# Patient Record
Sex: Male | Born: 1935 | Race: Black or African American | Hispanic: No | Smoking: Former smoker
Health system: Southern US, Community
[De-identification: ages and names within clinical notes are randomized; demographics above are authoritative.]

## PROBLEM LIST (undated history)

## (undated) DIAGNOSIS — I872 Venous insufficiency (chronic) (peripheral): Secondary | ICD-10-CM

## (undated) DIAGNOSIS — C801 Malignant (primary) neoplasm, unspecified: Secondary | ICD-10-CM

## (undated) DIAGNOSIS — H547 Unspecified visual loss: Secondary | ICD-10-CM

## (undated) DIAGNOSIS — I10 Essential (primary) hypertension: Secondary | ICD-10-CM

## (undated) DIAGNOSIS — H548 Legal blindness, as defined in USA: Secondary | ICD-10-CM

## (undated) DIAGNOSIS — E785 Hyperlipidemia, unspecified: Secondary | ICD-10-CM

## (undated) DIAGNOSIS — Z8719 Personal history of other diseases of the digestive system: Principal | ICD-10-CM

## (undated) DIAGNOSIS — F329 Major depressive disorder, single episode, unspecified: Secondary | ICD-10-CM

## (undated) DIAGNOSIS — I5032 Chronic diastolic (congestive) heart failure: Secondary | ICD-10-CM

## (undated) DIAGNOSIS — M1990 Unspecified osteoarthritis, unspecified site: Secondary | ICD-10-CM

## (undated) DIAGNOSIS — K219 Gastro-esophageal reflux disease without esophagitis: Secondary | ICD-10-CM

## (undated) DIAGNOSIS — J449 Chronic obstructive pulmonary disease, unspecified: Secondary | ICD-10-CM

## (undated) DIAGNOSIS — F32A Depression, unspecified: Secondary | ICD-10-CM

## (undated) DIAGNOSIS — IMO0001 Reserved for inherently not codable concepts without codable children: Secondary | ICD-10-CM

## (undated) DIAGNOSIS — K5900 Constipation, unspecified: Secondary | ICD-10-CM

## (undated) DIAGNOSIS — M48061 Spinal stenosis, lumbar region without neurogenic claudication: Secondary | ICD-10-CM

## (undated) DIAGNOSIS — F528 Other sexual dysfunction not due to a substance or known physiological condition: Secondary | ICD-10-CM

## (undated) DIAGNOSIS — L0390 Cellulitis, unspecified: Secondary | ICD-10-CM

## (undated) HISTORY — DX: Chronic diastolic (congestive) heart failure: I50.32

## (undated) HISTORY — DX: Spinal stenosis, lumbar region without neurogenic claudication: M48.061

## (undated) HISTORY — PX: COLON SURGERY: SHX602

## (undated) HISTORY — DX: Other sexual dysfunction not due to a substance or known physiological condition: F52.8

## (undated) HISTORY — PX: EXPLORATORY LAPAROTOMY: SUR591

## (undated) HISTORY — PX: APPENDECTOMY: SHX54

## (undated) HISTORY — DX: Personal history of other diseases of the digestive system: Z87.19

## (undated) HISTORY — PX: EYE SURGERY: SHX253

## (undated) HISTORY — DX: Chronic obstructive pulmonary disease, unspecified: J44.9

## (undated) HISTORY — DX: Unspecified osteoarthritis, unspecified site: M19.90

## (undated) HISTORY — DX: Legal blindness, as defined in USA: H54.8

---

## 1999-07-06 ENCOUNTER — Encounter: Payer: Self-pay | Admitting: Family Medicine

## 1999-07-06 ENCOUNTER — Encounter: Admission: RE | Admit: 1999-07-06 | Discharge: 1999-07-06 | Payer: Self-pay | Admitting: Family Medicine

## 1999-07-06 ENCOUNTER — Ambulatory Visit (HOSPITAL_COMMUNITY): Admission: RE | Admit: 1999-07-06 | Discharge: 1999-07-06 | Payer: Self-pay | Admitting: Family Medicine

## 2001-06-21 ENCOUNTER — Encounter: Admission: RE | Admit: 2001-06-21 | Discharge: 2001-06-21 | Payer: Self-pay | Admitting: Family Medicine

## 2001-06-21 ENCOUNTER — Encounter: Payer: Self-pay | Admitting: Family Medicine

## 2001-10-28 ENCOUNTER — Encounter: Payer: Self-pay | Admitting: Family Medicine

## 2001-10-28 ENCOUNTER — Encounter: Admission: RE | Admit: 2001-10-28 | Discharge: 2001-10-28 | Payer: Self-pay | Admitting: Family Medicine

## 2002-09-16 ENCOUNTER — Encounter: Admission: RE | Admit: 2002-09-16 | Discharge: 2002-09-16 | Payer: Self-pay | Admitting: Family Medicine

## 2002-09-16 ENCOUNTER — Encounter: Payer: Self-pay | Admitting: Family Medicine

## 2003-06-01 ENCOUNTER — Emergency Department (HOSPITAL_COMMUNITY): Admission: AD | Admit: 2003-06-01 | Discharge: 2003-06-01 | Payer: Self-pay | Admitting: Family Medicine

## 2003-10-08 ENCOUNTER — Encounter: Admission: RE | Admit: 2003-10-08 | Discharge: 2003-10-08 | Payer: Self-pay | Admitting: Internal Medicine

## 2003-10-22 ENCOUNTER — Encounter: Admission: RE | Admit: 2003-10-22 | Discharge: 2003-10-22 | Payer: Self-pay | Admitting: Internal Medicine

## 2003-11-20 ENCOUNTER — Encounter: Admission: RE | Admit: 2003-11-20 | Discharge: 2003-11-20 | Payer: Self-pay | Admitting: Internal Medicine

## 2003-12-04 ENCOUNTER — Encounter: Admission: RE | Admit: 2003-12-04 | Discharge: 2003-12-04 | Payer: Self-pay | Admitting: Internal Medicine

## 2003-12-11 ENCOUNTER — Encounter: Admission: RE | Admit: 2003-12-11 | Discharge: 2003-12-11 | Payer: Self-pay | Admitting: Internal Medicine

## 2003-12-11 ENCOUNTER — Ambulatory Visit (HOSPITAL_COMMUNITY): Admission: RE | Admit: 2003-12-11 | Discharge: 2003-12-11 | Payer: Self-pay | Admitting: Internal Medicine

## 2003-12-16 ENCOUNTER — Ambulatory Visit (HOSPITAL_BASED_OUTPATIENT_CLINIC_OR_DEPARTMENT_OTHER): Admission: RE | Admit: 2003-12-16 | Discharge: 2003-12-16 | Payer: Self-pay | Admitting: Internal Medicine

## 2003-12-29 ENCOUNTER — Ambulatory Visit (HOSPITAL_COMMUNITY): Admission: RE | Admit: 2003-12-29 | Discharge: 2003-12-29 | Payer: Self-pay

## 2003-12-29 ENCOUNTER — Encounter (INDEPENDENT_AMBULATORY_CARE_PROVIDER_SITE_OTHER): Payer: Self-pay | Admitting: *Deleted

## 2003-12-30 ENCOUNTER — Encounter: Payer: Self-pay | Admitting: Cardiology

## 2003-12-30 ENCOUNTER — Ambulatory Visit (HOSPITAL_COMMUNITY): Admission: RE | Admit: 2003-12-30 | Discharge: 2003-12-30 | Payer: Self-pay | Admitting: Internal Medicine

## 2004-01-15 ENCOUNTER — Ambulatory Visit: Payer: Self-pay | Admitting: Internal Medicine

## 2004-03-28 ENCOUNTER — Ambulatory Visit: Payer: Self-pay | Admitting: Internal Medicine

## 2004-03-30 ENCOUNTER — Ambulatory Visit: Payer: Self-pay | Admitting: Internal Medicine

## 2004-04-14 ENCOUNTER — Ambulatory Visit: Payer: Self-pay | Admitting: Internal Medicine

## 2004-04-15 ENCOUNTER — Ambulatory Visit: Payer: Self-pay | Admitting: Internal Medicine

## 2004-05-06 ENCOUNTER — Ambulatory Visit (HOSPITAL_COMMUNITY): Admission: RE | Admit: 2004-05-06 | Discharge: 2004-05-06 | Payer: Self-pay | Admitting: Internal Medicine

## 2004-07-13 ENCOUNTER — Ambulatory Visit: Payer: Self-pay | Admitting: Internal Medicine

## 2004-09-26 ENCOUNTER — Ambulatory Visit (HOSPITAL_COMMUNITY): Admission: RE | Admit: 2004-09-26 | Discharge: 2004-09-26 | Payer: Self-pay | Admitting: Gastroenterology

## 2004-09-30 ENCOUNTER — Ambulatory Visit: Payer: Self-pay | Admitting: Internal Medicine

## 2004-10-07 ENCOUNTER — Ambulatory Visit (HOSPITAL_COMMUNITY): Admission: RE | Admit: 2004-10-07 | Discharge: 2004-10-07 | Payer: Self-pay | Admitting: Internal Medicine

## 2004-10-10 ENCOUNTER — Ambulatory Visit: Payer: Self-pay | Admitting: Internal Medicine

## 2004-10-31 ENCOUNTER — Ambulatory Visit: Payer: Self-pay | Admitting: Internal Medicine

## 2004-11-28 ENCOUNTER — Ambulatory Visit: Payer: Self-pay | Admitting: Internal Medicine

## 2004-12-27 ENCOUNTER — Ambulatory Visit: Payer: Self-pay | Admitting: Internal Medicine

## 2005-02-07 ENCOUNTER — Ambulatory Visit (HOSPITAL_COMMUNITY): Admission: RE | Admit: 2005-02-07 | Discharge: 2005-02-07 | Payer: Self-pay | Admitting: Internal Medicine

## 2005-02-07 ENCOUNTER — Ambulatory Visit: Payer: Self-pay | Admitting: Hospitalist

## 2005-06-20 ENCOUNTER — Ambulatory Visit: Payer: Self-pay | Admitting: Internal Medicine

## 2005-06-23 ENCOUNTER — Ambulatory Visit (HOSPITAL_COMMUNITY): Admission: RE | Admit: 2005-06-23 | Discharge: 2005-06-23 | Payer: Self-pay | Admitting: Internal Medicine

## 2005-09-05 ENCOUNTER — Ambulatory Visit: Payer: Self-pay | Admitting: Hospitalist

## 2005-10-18 ENCOUNTER — Ambulatory Visit (HOSPITAL_COMMUNITY): Admission: EM | Admit: 2005-10-18 | Discharge: 2005-10-19 | Payer: Self-pay | Admitting: Family Medicine

## 2005-11-17 ENCOUNTER — Ambulatory Visit: Payer: Self-pay | Admitting: Internal Medicine

## 2006-02-07 ENCOUNTER — Ambulatory Visit: Payer: Self-pay | Admitting: Internal Medicine

## 2006-04-11 ENCOUNTER — Ambulatory Visit: Payer: Self-pay | Admitting: Internal Medicine

## 2006-04-11 ENCOUNTER — Encounter (INDEPENDENT_AMBULATORY_CARE_PROVIDER_SITE_OTHER): Payer: Self-pay | Admitting: Internal Medicine

## 2006-04-11 LAB — CONVERTED CEMR LAB
BUN: 14 mg/dL (ref 6–23)
Chloride: 104 meq/L (ref 96–112)
Creatinine, Ser: 1.1 mg/dL (ref 0.40–1.50)
Glucose, Bld: 104 mg/dL — ABNORMAL HIGH (ref 70–99)
LDL Cholesterol: 71 mg/dL (ref 0–99)
Potassium: 4.1 meq/L (ref 3.5–5.3)
Triglycerides: 54 mg/dL (ref <150)
VLDL: 11 mg/dL (ref 0–40)

## 2006-05-25 ENCOUNTER — Telehealth: Payer: Self-pay | Admitting: *Deleted

## 2006-07-11 ENCOUNTER — Telehealth (INDEPENDENT_AMBULATORY_CARE_PROVIDER_SITE_OTHER): Payer: Self-pay | Admitting: *Deleted

## 2006-08-07 ENCOUNTER — Telehealth (INDEPENDENT_AMBULATORY_CARE_PROVIDER_SITE_OTHER): Payer: Self-pay | Admitting: *Deleted

## 2006-08-15 ENCOUNTER — Ambulatory Visit: Payer: Self-pay | Admitting: *Deleted

## 2006-08-15 DIAGNOSIS — I10 Essential (primary) hypertension: Secondary | ICD-10-CM | POA: Insufficient documentation

## 2006-08-15 DIAGNOSIS — E785 Hyperlipidemia, unspecified: Secondary | ICD-10-CM

## 2006-08-15 DIAGNOSIS — H548 Legal blindness, as defined in USA: Secondary | ICD-10-CM

## 2006-08-15 DIAGNOSIS — M159 Polyosteoarthritis, unspecified: Secondary | ICD-10-CM

## 2006-08-15 DIAGNOSIS — F528 Other sexual dysfunction not due to a substance or known physiological condition: Secondary | ICD-10-CM

## 2006-08-15 HISTORY — DX: Legal blindness, as defined in USA: H54.8

## 2006-08-15 HISTORY — DX: Other sexual dysfunction not due to a substance or known physiological condition: F52.8

## 2006-08-24 ENCOUNTER — Telehealth (INDEPENDENT_AMBULATORY_CARE_PROVIDER_SITE_OTHER): Payer: Self-pay | Admitting: *Deleted

## 2006-08-27 ENCOUNTER — Ambulatory Visit: Payer: Self-pay | Admitting: Internal Medicine

## 2006-08-27 ENCOUNTER — Encounter (INDEPENDENT_AMBULATORY_CARE_PROVIDER_SITE_OTHER): Payer: Self-pay | Admitting: *Deleted

## 2006-08-27 LAB — CONVERTED CEMR LAB
ALT: 11 units/L (ref 0–53)
AST: 12 units/L (ref 0–37)
Albumin: 4.1 g/dL (ref 3.5–5.2)
CO2: 29 meq/L (ref 19–32)
Calcium: 9.1 mg/dL (ref 8.4–10.5)
Chloride: 105 meq/L (ref 96–112)
Creatinine, Ser: 1.3 mg/dL (ref 0.40–1.50)
Potassium: 4.1 meq/L (ref 3.5–5.3)
Sodium: 142 meq/L (ref 135–145)
Total Protein: 6.9 g/dL (ref 6.0–8.3)

## 2006-09-04 ENCOUNTER — Encounter (INDEPENDENT_AMBULATORY_CARE_PROVIDER_SITE_OTHER): Payer: Self-pay | Admitting: Internal Medicine

## 2006-09-24 ENCOUNTER — Telehealth (INDEPENDENT_AMBULATORY_CARE_PROVIDER_SITE_OTHER): Payer: Self-pay | Admitting: *Deleted

## 2007-01-21 ENCOUNTER — Telehealth: Payer: Self-pay | Admitting: *Deleted

## 2007-01-25 ENCOUNTER — Telehealth (INDEPENDENT_AMBULATORY_CARE_PROVIDER_SITE_OTHER): Payer: Self-pay | Admitting: *Deleted

## 2007-02-07 ENCOUNTER — Ambulatory Visit (HOSPITAL_COMMUNITY): Admission: RE | Admit: 2007-02-07 | Discharge: 2007-02-07 | Payer: Self-pay | Admitting: Internal Medicine

## 2007-02-07 ENCOUNTER — Ambulatory Visit: Payer: Self-pay | Admitting: Internal Medicine

## 2007-02-07 ENCOUNTER — Encounter (INDEPENDENT_AMBULATORY_CARE_PROVIDER_SITE_OTHER): Payer: Self-pay | Admitting: Infectious Diseases

## 2007-02-07 DIAGNOSIS — M25559 Pain in unspecified hip: Secondary | ICD-10-CM

## 2007-02-07 LAB — CONVERTED CEMR LAB
ALT: 9 units/L (ref 0–53)
AST: 13 units/L (ref 0–37)
Alkaline Phosphatase: 65 units/L (ref 39–117)
CO2: 27 meq/L (ref 19–32)
Creatinine, Ser: 1.2 mg/dL (ref 0.40–1.50)
Sodium: 142 meq/L (ref 135–145)
Total Bilirubin: 0.4 mg/dL (ref 0.3–1.2)
Total Protein: 6.8 g/dL (ref 6.0–8.3)

## 2007-03-25 ENCOUNTER — Telehealth (INDEPENDENT_AMBULATORY_CARE_PROVIDER_SITE_OTHER): Payer: Self-pay | Admitting: *Deleted

## 2007-05-15 ENCOUNTER — Ambulatory Visit: Payer: Self-pay | Admitting: Internal Medicine

## 2007-05-15 DIAGNOSIS — M5430 Sciatica, unspecified side: Secondary | ICD-10-CM | POA: Insufficient documentation

## 2007-06-05 ENCOUNTER — Encounter: Admission: RE | Admit: 2007-06-05 | Discharge: 2007-06-19 | Payer: Self-pay | Admitting: *Deleted

## 2007-06-17 ENCOUNTER — Telehealth: Payer: Self-pay | Admitting: Internal Medicine

## 2007-06-17 ENCOUNTER — Encounter: Payer: Self-pay | Admitting: Internal Medicine

## 2007-08-27 ENCOUNTER — Encounter: Payer: Self-pay | Admitting: Internal Medicine

## 2007-08-29 ENCOUNTER — Telehealth: Payer: Self-pay | Admitting: Infectious Disease

## 2007-09-16 ENCOUNTER — Telehealth: Payer: Self-pay | Admitting: Internal Medicine

## 2007-09-18 ENCOUNTER — Encounter: Payer: Self-pay | Admitting: Internal Medicine

## 2007-09-18 ENCOUNTER — Ambulatory Visit: Payer: Self-pay | Admitting: *Deleted

## 2007-09-19 LAB — CONVERTED CEMR LAB
Albumin: 4.7 g/dL (ref 3.5–5.2)
BUN: 19 mg/dL (ref 6–23)
Calcium: 9.5 mg/dL (ref 8.4–10.5)
Chloride: 104 meq/L (ref 96–112)
Creatinine, Ser: 1.22 mg/dL (ref 0.40–1.50)
Eosinophils Absolute: 0.4 10*3/uL (ref 0.0–0.7)
Glucose, Bld: 104 mg/dL — ABNORMAL HIGH (ref 70–99)
Hemoglobin: 12 g/dL — ABNORMAL LOW (ref 13.0–17.0)
Leukocytes, UA: NEGATIVE
Lymphs Abs: 1.8 10*3/uL (ref 0.7–4.0)
MCHC: 31.8 g/dL (ref 30.0–36.0)
MCV: 86.1 fL (ref 78.0–100.0)
Microalb, Ur: 1.04 mg/dL (ref 0.00–1.89)
Monocytes Absolute: 0.7 10*3/uL (ref 0.1–1.0)
Monocytes Relative: 15 % — ABNORMAL HIGH (ref 3–12)
Neutrophils Relative %: 40 % — ABNORMAL LOW (ref 43–77)
Nitrite: NEGATIVE
Potassium: 3.8 meq/L (ref 3.5–5.3)
Protein, ur: NEGATIVE mg/dL
RBC: 4.38 M/uL (ref 4.22–5.81)
Specific Gravity, Urine: 1.024 (ref 1.005–1.03)
Urobilinogen, UA: 0.2 (ref 0.0–1.0)
WBC: 4.8 10*3/uL (ref 4.0–10.5)

## 2007-10-18 ENCOUNTER — Telehealth: Payer: Self-pay | Admitting: *Deleted

## 2007-10-22 ENCOUNTER — Encounter: Payer: Self-pay | Admitting: Internal Medicine

## 2007-12-03 ENCOUNTER — Telehealth: Payer: Self-pay | Admitting: Internal Medicine

## 2007-12-04 ENCOUNTER — Ambulatory Visit: Payer: Self-pay | Admitting: Internal Medicine

## 2007-12-10 ENCOUNTER — Encounter: Payer: Self-pay | Admitting: Internal Medicine

## 2007-12-10 ENCOUNTER — Ambulatory Visit: Payer: Self-pay | Admitting: *Deleted

## 2007-12-10 DIAGNOSIS — L259 Unspecified contact dermatitis, unspecified cause: Secondary | ICD-10-CM | POA: Insufficient documentation

## 2007-12-11 LAB — CONVERTED CEMR LAB
Eosinophils Absolute: 0.8 10*3/uL — ABNORMAL HIGH (ref 0.0–0.7)
Lymphocytes Relative: 22 % (ref 12–46)
Lymphs Abs: 1.6 10*3/uL (ref 0.7–4.0)
Neutro Abs: 4 10*3/uL (ref 1.7–7.7)
Neutrophils Relative %: 55 % (ref 43–77)
Platelets: 330 10*3/uL (ref 150–400)
WBC: 7.2 10*3/uL (ref 4.0–10.5)

## 2007-12-18 ENCOUNTER — Ambulatory Visit: Payer: Self-pay | Admitting: Infectious Diseases

## 2007-12-18 ENCOUNTER — Encounter (INDEPENDENT_AMBULATORY_CARE_PROVIDER_SITE_OTHER): Payer: Self-pay | Admitting: *Deleted

## 2007-12-19 LAB — CONVERTED CEMR LAB
Albumin: 3.6 g/dL (ref 3.5–5.2)
Alkaline Phosphatase: 66 units/L (ref 39–117)
CO2: 28 meq/L (ref 19–32)
Chloride: 102 meq/L (ref 96–112)
Glucose, Bld: 103 mg/dL — ABNORMAL HIGH (ref 70–99)
LDL Cholesterol: 100 mg/dL — ABNORMAL HIGH (ref 0–99)
Potassium: 4.3 meq/L (ref 3.5–5.3)
Sodium: 142 meq/L (ref 135–145)
Total Protein: 7.6 g/dL (ref 6.0–8.3)
Triglycerides: 236 mg/dL — ABNORMAL HIGH (ref <150)

## 2007-12-23 ENCOUNTER — Emergency Department (HOSPITAL_COMMUNITY): Admission: EM | Admit: 2007-12-23 | Discharge: 2007-12-23 | Payer: Self-pay | Admitting: Family Medicine

## 2007-12-25 ENCOUNTER — Emergency Department (HOSPITAL_COMMUNITY): Admission: EM | Admit: 2007-12-25 | Discharge: 2007-12-25 | Payer: Self-pay | Admitting: Family Medicine

## 2007-12-26 ENCOUNTER — Ambulatory Visit: Payer: Self-pay | Admitting: *Deleted

## 2007-12-26 DIAGNOSIS — L03119 Cellulitis of unspecified part of limb: Secondary | ICD-10-CM

## 2007-12-26 DIAGNOSIS — L02419 Cutaneous abscess of limb, unspecified: Secondary | ICD-10-CM

## 2008-01-01 ENCOUNTER — Ambulatory Visit: Payer: Self-pay | Admitting: *Deleted

## 2008-01-08 ENCOUNTER — Ambulatory Visit: Payer: Self-pay | Admitting: Vascular Surgery

## 2008-01-08 ENCOUNTER — Ambulatory Visit: Admission: RE | Admit: 2008-01-08 | Discharge: 2008-01-08 | Payer: Self-pay | Admitting: Family Medicine

## 2008-01-08 ENCOUNTER — Encounter (INDEPENDENT_AMBULATORY_CARE_PROVIDER_SITE_OTHER): Payer: Self-pay | Admitting: Orthopedic Surgery

## 2008-01-22 ENCOUNTER — Ambulatory Visit: Payer: Self-pay | Admitting: Internal Medicine

## 2008-01-29 ENCOUNTER — Encounter: Payer: Self-pay | Admitting: Internal Medicine

## 2008-02-03 ENCOUNTER — Encounter: Payer: Self-pay | Admitting: Internal Medicine

## 2008-02-20 ENCOUNTER — Ambulatory Visit: Payer: Self-pay | Admitting: Infectious Disease

## 2008-02-24 ENCOUNTER — Ambulatory Visit: Payer: Self-pay | Admitting: Family Medicine

## 2008-03-30 ENCOUNTER — Ambulatory Visit: Payer: Self-pay | Admitting: Family Medicine

## 2008-03-30 DIAGNOSIS — M48061 Spinal stenosis, lumbar region without neurogenic claudication: Secondary | ICD-10-CM

## 2008-03-30 HISTORY — DX: Spinal stenosis, lumbar region without neurogenic claudication: M48.061

## 2008-04-27 ENCOUNTER — Ambulatory Visit: Payer: Self-pay | Admitting: Sports Medicine

## 2008-05-05 ENCOUNTER — Encounter (INDEPENDENT_AMBULATORY_CARE_PROVIDER_SITE_OTHER): Payer: Self-pay | Admitting: *Deleted

## 2008-05-18 ENCOUNTER — Telehealth (INDEPENDENT_AMBULATORY_CARE_PROVIDER_SITE_OTHER): Payer: Self-pay | Admitting: *Deleted

## 2008-05-29 ENCOUNTER — Ambulatory Visit: Payer: Self-pay | Admitting: Sports Medicine

## 2008-06-01 ENCOUNTER — Telehealth (INDEPENDENT_AMBULATORY_CARE_PROVIDER_SITE_OTHER): Payer: Self-pay | Admitting: *Deleted

## 2008-06-04 ENCOUNTER — Encounter: Payer: Self-pay | Admitting: Internal Medicine

## 2008-06-10 ENCOUNTER — Encounter: Payer: Self-pay | Admitting: Internal Medicine

## 2008-06-10 ENCOUNTER — Ambulatory Visit: Payer: Self-pay | Admitting: *Deleted

## 2008-06-24 ENCOUNTER — Encounter: Payer: Self-pay | Admitting: Internal Medicine

## 2008-06-30 ENCOUNTER — Ambulatory Visit: Payer: Self-pay | Admitting: Internal Medicine

## 2008-06-30 ENCOUNTER — Encounter: Payer: Self-pay | Admitting: Internal Medicine

## 2008-07-01 LAB — CONVERTED CEMR LAB
CO2: 28 meq/L (ref 19–32)
Chloride: 104 meq/L (ref 96–112)
Creatinine, Ser: 1.63 mg/dL — ABNORMAL HIGH (ref 0.40–1.50)

## 2008-08-13 ENCOUNTER — Telehealth: Payer: Self-pay | Admitting: *Deleted

## 2008-10-21 ENCOUNTER — Ambulatory Visit: Payer: Self-pay | Admitting: Internal Medicine

## 2008-10-21 ENCOUNTER — Encounter: Payer: Self-pay | Admitting: Internal Medicine

## 2008-10-21 LAB — CONVERTED CEMR LAB
BUN: 19 mg/dL (ref 6–23)
CO2: 26 meq/L (ref 19–32)
Chloride: 103 meq/L (ref 96–112)
Creatinine, Ser: 1.51 mg/dL — ABNORMAL HIGH (ref 0.40–1.50)
GFR calc non Af Amer: 46 mL/min — ABNORMAL LOW (ref >60)

## 2008-12-10 ENCOUNTER — Ambulatory Visit: Payer: Self-pay | Admitting: Internal Medicine

## 2008-12-10 DIAGNOSIS — M79609 Pain in unspecified limb: Secondary | ICD-10-CM

## 2008-12-16 ENCOUNTER — Encounter: Payer: Self-pay | Admitting: Internal Medicine

## 2008-12-28 ENCOUNTER — Encounter: Payer: Self-pay | Admitting: Internal Medicine

## 2009-02-05 ENCOUNTER — Ambulatory Visit: Payer: Self-pay | Admitting: Internal Medicine

## 2009-04-28 ENCOUNTER — Telehealth: Payer: Self-pay | Admitting: Internal Medicine

## 2009-05-08 DIAGNOSIS — Z8719 Personal history of other diseases of the digestive system: Secondary | ICD-10-CM

## 2009-05-08 HISTORY — DX: Personal history of other diseases of the digestive system: Z87.19

## 2009-05-13 ENCOUNTER — Ambulatory Visit: Payer: Self-pay | Admitting: Internal Medicine

## 2009-06-10 ENCOUNTER — Telehealth: Payer: Self-pay | Admitting: Internal Medicine

## 2009-06-21 ENCOUNTER — Ambulatory Visit: Payer: Self-pay | Admitting: Internal Medicine

## 2009-06-21 LAB — CONVERTED CEMR LAB
ALT: 12 units/L (ref 0–53)
Albumin: 4.2 g/dL (ref 3.5–5.2)
BUN: 17 mg/dL (ref 6–23)
CO2: 31 meq/L (ref 19–32)
Calcium: 9.4 mg/dL (ref 8.4–10.5)
Chloride: 99 meq/L (ref 96–112)
Cholesterol: 190 mg/dL (ref 0–200)
Creatinine, Ser: 1.25 mg/dL (ref 0.40–1.50)
Eosinophils Absolute: 0.3 10*3/uL (ref 0.0–0.7)
Eosinophils Relative: 6 % — ABNORMAL HIGH (ref 0–5)
HCT: 40.5 % (ref 39.0–52.0)
HDL: 38 mg/dL — ABNORMAL LOW (ref >39)
Hemoglobin: 12.6 g/dL — ABNORMAL LOW (ref 13.0–17.0)
Lymphs Abs: 2.1 10*3/uL (ref 0.7–4.0)
MCV: 85.8 fL (ref >=78.0)
Monocytes Relative: 11 % (ref 3–12)
RBC: 4.72 M/uL (ref 4.22–5.81)
Total CHOL/HDL Ratio: 5
WBC: 5.3 10*3/uL (ref 4.0–10.5)

## 2009-06-25 ENCOUNTER — Encounter: Payer: Self-pay | Admitting: Internal Medicine

## 2009-07-23 ENCOUNTER — Encounter: Payer: Self-pay | Admitting: Internal Medicine

## 2009-08-13 ENCOUNTER — Encounter: Payer: Self-pay | Admitting: Internal Medicine

## 2009-08-19 ENCOUNTER — Ambulatory Visit: Payer: Self-pay | Admitting: Internal Medicine

## 2009-10-26 ENCOUNTER — Telehealth (INDEPENDENT_AMBULATORY_CARE_PROVIDER_SITE_OTHER): Payer: Self-pay | Admitting: *Deleted

## 2009-12-17 ENCOUNTER — Emergency Department (HOSPITAL_COMMUNITY): Admission: EM | Admit: 2009-12-17 | Discharge: 2009-12-17 | Payer: Self-pay | Admitting: Family Medicine

## 2009-12-17 ENCOUNTER — Telehealth: Payer: Self-pay | Admitting: Internal Medicine

## 2009-12-20 ENCOUNTER — Ambulatory Visit: Payer: Self-pay | Admitting: Internal Medicine

## 2009-12-20 DIAGNOSIS — M542 Cervicalgia: Secondary | ICD-10-CM | POA: Insufficient documentation

## 2010-02-07 ENCOUNTER — Ambulatory Visit: Payer: Self-pay | Admitting: Internal Medicine

## 2010-02-07 DIAGNOSIS — K5900 Constipation, unspecified: Secondary | ICD-10-CM | POA: Insufficient documentation

## 2010-02-07 DIAGNOSIS — K219 Gastro-esophageal reflux disease without esophagitis: Secondary | ICD-10-CM

## 2010-02-17 ENCOUNTER — Ambulatory Visit: Payer: Self-pay | Admitting: Internal Medicine

## 2010-02-17 LAB — CONVERTED CEMR LAB
CO2: 31 meq/L (ref 19–32)
Calcium: 9.1 mg/dL (ref 8.4–10.5)
Chloride: 100 meq/L (ref 96–112)
Glucose, Bld: 98 mg/dL (ref 70–99)
Sodium: 139 meq/L (ref 135–145)

## 2010-03-09 ENCOUNTER — Inpatient Hospital Stay (HOSPITAL_COMMUNITY): Admission: EM | Admit: 2010-03-09 | Discharge: 2010-03-17 | Payer: Self-pay | Admitting: Emergency Medicine

## 2010-03-09 ENCOUNTER — Ambulatory Visit: Payer: Self-pay | Admitting: Internal Medicine

## 2010-03-10 ENCOUNTER — Encounter: Payer: Self-pay | Admitting: Internal Medicine

## 2010-03-13 ENCOUNTER — Encounter: Payer: Self-pay | Admitting: Internal Medicine

## 2010-04-12 ENCOUNTER — Ambulatory Visit: Payer: Self-pay | Admitting: Internal Medicine

## 2010-05-18 ENCOUNTER — Ambulatory Visit: Admit: 2010-05-18 | Payer: Self-pay

## 2010-05-24 ENCOUNTER — Telehealth: Payer: Self-pay | Admitting: Internal Medicine

## 2010-05-28 ENCOUNTER — Encounter: Payer: Self-pay | Admitting: Internal Medicine

## 2010-06-03 ENCOUNTER — Ambulatory Visit: Admit: 2010-06-03 | Payer: Self-pay

## 2010-06-07 NOTE — Letter (Signed)
Summary: OUTPATIENT CLINIC MEDICATION CONTRACT  OUTPATIENT CLINIC MEDICATION CONTRACT   Imported By: Margie Billet 08/20/2009 13:44:18  _____________________________________________________________________  External Attachment:    Type:   Image     Comment:   External Document

## 2010-06-07 NOTE — Assessment & Plan Note (Signed)
Summary: urgent care-high blood pressure per dr mcphearson/[facility]([place])   Vital Signs:  Patient profile:   76 year old male Height:      71 inches Weight:      245.5 pounds BMI:     34.36 Temp:     99.1 degrees F oral Pulse rate:   91 / minute BP sitting:   189 / 94  (right arm)  Vitals Entered By: Filomena Jungling NT II (December 20, 2009 9:05 AM) CC: ER FOLLOWUP, Hypertension Management Is Patient Diabetic? No Pain Assessment Patient in pain? yes     Location: NECK,BACK,  Intensity: 8 Type: aching Nutritional Status BMI of > 30 = obese  Have you ever been in a relationship where you felt threatened, hurt or afraid?No   Does patient need assistance? Functional Status Self care Ambulation Normal   Primary Care Provider:  Julaine Fusi  DO  CC:  ER FOLLOWUP and Hypertension Management.  History of Present Illness: Follow up on: 1. HTN --takes all his meds as prescribed. Took today's dose 1 hour prior ot OV. Denies any HA, dizzines, CP, SOB or any other Sx. 2. Follow up from ED on 12/17/09 for a C-spine MSK strain. Felxeryl alleviates the Sx. Denies any weakness, tingling or numbness. Reports gradula resolution of left-sided neck pain which is currently at 3/10 in intensity and without  radiculopathy. 3. Requests refills on his meds ->requests a hard copy "b/c having trouble filling them when just called in."  Hypertension History:      Positive major cardiovascular risk factors include male age 57 years old or older, hyperlipidemia, and hypertension.  Negative major cardiovascular risk factors include non-tobacco-user status.        Further assessment for target organ damage reveals no history of ASHD or cardiac end-organ damage (CHF/LVH).     Preventive Screening-Counseling & Management  Alcohol-Tobacco     Smoking Status: quit     Year Quit: 20 YEARS AGO  Caffeine-Diet-Exercise     Does Patient Exercise: yes     Type of exercise: WALKING OUTSIDE     Times/week:  3  Problems Prior to Update: 1)  Neck Pain, Left  (ICD-723.1) 2)  Foot Pain, Bilateral  (ICD-729.5) 3)  Spinal Stenosis, Lumbar  (ICD-724.02) 4)  Cellulitis, Leg, Right  (ICD-682.6) 5)  Contact Dermatitis&other Eczema Due Unspec Cause  (ICD-692.9) 6)  Hypertension, Essential Nos  (ICD-401.9) 7)  Osteoarthrosis, Generalized, Multiple Sites  (ICD-715.09) 8)  Hip Pain, Right, Chronic  (ICD-719.45) 9)  Hyperlipidemia  (ICD-272.4) 10)  Sciatica  (ICD-724.3) 11)  Blindness  (ICD-369.4) 12)  Erectile Dysfunction  (ICD-302.72) 13)  Preventive Health Care  (ICD-V70.0)  Current Problems (verified): 1)  Foot Pain, Bilateral  (ICD-729.5) 2)  Spinal Stenosis, Lumbar  (ICD-724.02) 3)  Cellulitis, Leg, Right  (ICD-682.6) 4)  Contact Dermatitis&other Eczema Due Unspec Cause  (ICD-692.9) 5)  Hypertension, Essential Nos  (ICD-401.9) 6)  Osteoarthrosis, Generalized, Multiple Sites  (ICD-715.09) 7)  Hip Pain, Right, Chronic  (ICD-719.45) 8)  Hyperlipidemia  (ICD-272.4) 9)  Sciatica  (ICD-724.3) 10)  Blindness  (ICD-369.4) 11)  Erectile Dysfunction  (ICD-302.72) 12)  Preventive Health Care  (ICD-V70.0)  Medications Prior to Update: 1)  Hydrocodone-Acetaminophen 7.5-325 Mg Tabs (Hydrocodone-Acetaminophen) .... One Tablet Every 4 Hours As Needed For Pain 2)  Gabapentin 600 Mg Tabs (Gabapentin) .... Take 1 Tablet By Mouth Three Times A Day or As Directed 3)  Voltaren 1 % Gel (Diclofenac Sodium) .... Apply Three Times A Day As Directed  To Painful Joints 4)  Enalapril-Hydrochlorothiazide 10-25 Mg Tabs (Enalapril-Hydrochlorothiazide) .... Take 1 Tablet By Mouth Once A Day 5)  Aspirin 81 Mg Tbec (Aspirin) 6)  Coreg 12.5 Mg Tabs (Carvedilol) .... Take 1 Tablet By Mouth Two Times A Day 7)  Aspirin 81 Mg Tbec (Aspirin) 8)  Triamcinolone Acetonide 0.1 % Crea (Triamcinolone Acetonide) .... Apply To Affected Area Twice Daily 9)  Crestor 5 Mg Tabs (Rosuvastatin Calcium) .... Take 1 Tablet By Mouth Once A Day At  Bedtime 10)  Flector 1.3 % Ptch (Diclofenac Epolamine) .... Apply One Patch To Right Hip As Directed For Pain  Current Medications (verified): 1)  Hydrocodone-Acetaminophen 7.5-325 Mg Tabs (Hydrocodone-Acetaminophen) .... One Tablet Every 4 Hours As Needed For Pain 2)  Gabapentin 600 Mg Tabs (Gabapentin) .... Take 1 Tablet By Mouth Three Times A Day or As Directed 3)  Voltaren 1 % Gel (Diclofenac Sodium) .... Apply Three Times A Day As Directed To Painful Joints 4)  Enalapril-Hydrochlorothiazide 10-25 Mg Tabs (Enalapril-Hydrochlorothiazide) .... Take 1 Tablet By Mouth Once A Day 5)  Aspirin 81 Mg Tbec (Aspirin) 6)  Coreg 12.5 Mg Tabs (Carvedilol) .... Take 1 Tablet By Mouth Two Times A Day 7)  Aspirin 81 Mg Tbec (Aspirin) 8)  Triamcinolone Acetonide 0.1 % Crea (Triamcinolone Acetonide) .... Apply To Affected Area Twice Daily 9)  Crestor 5 Mg Tabs (Rosuvastatin Calcium) .... Take 1 Tablet By Mouth Once A Day At Bedtime 10)  Flector 1.3 % Ptch (Diclofenac Epolamine) .... Apply One Patch To Right Hip As Directed For Pain  Allergies (verified): No Known Drug Allergies  Directives (verified): 1)  Full Code   Past History:  Past Medical History: Last updated: 12/04/2007 Blindness Gout Hypertension Anemia with a negative colonoscopy and long history of nonsteroidal anti-inflammatory drug use. Severe obstructive sleep apnea/hypopnea syndrome, respiratory disturbance index 52 per hour with mild oxygen desaturation  Family History: Last updated: 12/20/2009 No Heart Disease Both maternal grandparents: DM  Social History: Last updated: 06/10/2008 Married. Lives in [place] with sig.o. Marvene Staff Patient is legally Blind  Risk Factors: Exercise: yes (12/20/2009)  Risk Factors: Smoking Status: quit (12/20/2009)  Family History: Reviewed history from 05/15/2007 and no changes required. No Heart Disease Both maternal grandparents: DM  Social History: Reviewed history  from 06/10/2008 and no changes required. Married. Lives in [place] with sig.o. Marvene Staff Patient is legally Blind  Review of Systems  The patient denies anorexia, fever, weight loss, weight gain, vision loss, decreased hearing, hoarseness, chest pain, syncope, dyspnea on exertion, peripheral edema, prolonged cough, headaches, hemoptysis, abdominal pain, melena, hematochezia, severe indigestion/heartburn, hematuria, incontinence, genital sores, muscle weakness, suspicious skin lesions, transient blindness, difficulty walking, depression, unusual weight change, abnormal bleeding, enlarged lymph nodes, angioedema, breast masses, and testicular masses.    Physical Exam  General:  Well-developed,well-nourished,in no acute distress; alert,appropriate and cooperative throughout examination Head:  Normocephalic and atraumatic without obvious abnormalities. No apparent alopecia or balding. Eyes:  No corneal or conjunctival inflammation noted. EOMI. Perrla. Funduscopic exam benign, without hemorrhages, exudates or papilledema. Vision grossly normal. Ears:  External ear exam shows no significant lesions or deformities.  Otoscopic examination reveals clear canals, tympanic membranes are intact bilaterally without bulging, retraction, inflammation or discharge. Hearing is grossly normal bilaterally. Nose:  External nasal examination shows no deformity or inflammation. Nasal mucosa are pink and moist without lesions or exudates.; mildly hypertrophied turbinates.   Impression & Recommendations:  Problem # 1:  HYPERTENSION, ESSENTIAL NOS (ICD-401.9) Suboptimal control. Will double the dose  of Vasoretic. His updated medication list for this problem includes:    Enalapril-hydrochlorothiazide 10-25 Mg Tabs (Enalapril-hydrochlorothiazide) .Marland Kitchen... Take two tablets by mouth in am    Coreg 12.5 Mg Tabs (Carvedilol) .Marland Kitchen... Take 1 tablet by mouth two times a day  BP today: 189/94 Prior BP: 157/82  (08/19/2009)  Prior 10 Yr Risk Heart Disease: 40 % (12/10/2008)  Labs Reviewed: K+: 3.7 (06/21/2009) Creat: : 1.25 (06/21/2009)   Chol: 190 (06/21/2009)   HDL: 38 (06/21/2009)   LDL: 129 (06/21/2009)   TG: 116 (06/21/2009)  Problem # 2:  HYPERLIPIDEMIA (ICD-272.4) Increase dose of Rosuvastatin. Low cholesterol and high fiber diet discussed with the patient. His updated medication list for this problem includes:    Crestor 10 Mg Tabs (Rosuvastatin calcium) .Marland Kitchen... Take 1 tab by mouth at bedtime  Labs Reviewed: SGOT: 19 (06/21/2009)   SGPT: 12 (06/21/2009)  Lipid Goals: Chol Goal: 200 (12/10/2008)   HDL Goal: 40 (12/10/2008)   LDL Goal: 100 (12/10/2008)   TG Goal: 150 (12/10/2008)  Prior 10 Yr Risk Heart Disease: 40 % (12/10/2008)   HDL:38 (06/21/2009), 43 (12/18/2007)  LDL:129 (06/21/2009), 100 (12/18/2007)  Chol:190 (06/21/2009), 190 (12/18/2007)  Trig:116 (06/21/2009), 236 (12/18/2007)  Problem # 3:  NECK PAIN, LEFT (ICD-723.1)  Likely Sternocleidomastoidal muscle strain. Continue with warm and moist compresses three times a day as needed. Refilled Flexeryl --cautioned of sedation and risk for addiction. Patient and his wife verbalized understanding. His updated medication list for this problem includes:    Hydrocodone-acetaminophen 7.5-325 Mg Tabs (Hydrocodone-acetaminophen) ..... One tablet every 4 hours as needed for pain    Aspirin 81 Mg Tbec (Aspirin)    Cyclobenzaprine Hcl 5 Mg Tabs (Cyclobenzaprine hcl) .Marland Kitchen..Marland Kitchen Two times a day as needed  Discussed exercises and use of moist heat or cold and medication.   Complete Medication List: 1)  Hydrocodone-acetaminophen 7.5-325 Mg Tabs (Hydrocodone-acetaminophen) .... One tablet every 4 hours as needed for pain 2)  Gabapentin 600 Mg Tabs (Gabapentin) .... Take 1 tablet by mouth three times a day or as directed 3)  Voltaren 1 % Gel (Diclofenac sodium) .... Apply three times a day as directed to painful joints 4)   Enalapril-hydrochlorothiazide 10-25 Mg Tabs (Enalapril-hydrochlorothiazide) .... Take two tablets by mouth in am 5)  Aspirin 81 Mg Tbec (Aspirin) 6)  Coreg 12.5 Mg Tabs (Carvedilol) .... Take 1 tablet by mouth two times a day 7)  Aspirin 81 Mg Tbec (Aspirin) 8)  Triamcinolone Acetonide 0.1 % Crea (Triamcinolone acetonide) .... Apply to affected area twice daily 9)  Crestor 10 Mg Tabs (Rosuvastatin calcium) .... Take 1 tab by mouth at bedtime 10)  Cyclobenzaprine Hcl 5 Mg Tabs (Cyclobenzaprine hcl) .... Two times a day as needed  Hypertension Assessment/Plan:      The patient's hypertensive risk group is category B: At least one risk factor (excluding diabetes) with no target organ damage.  His calculated 10 year risk of coronary heart disease is 40 %.  Today's blood pressure is 189/94.  His blood pressure goal is < 140/90.  Patient Instructions: 1)  Please, return on 01/02/10 as was scheduled for a blood pressure recheck. Please, return in 8 weeks fasting for cholesterol check. 2)  Call with any questions. Prescriptions: CYCLOBENZAPRINE HCL 5 MG TABS (CYCLOBENZAPRINE HCL) two times a day as needed  #60 x 0   Entered and Authorized by:   Deatra Robinson MD   Signed by:   Deatra Robinson MD on 12/20/2009   Method used:   Print  then Give to Patient   RxID:   [id number] TRIAMCINOLONE ACETONIDE 0.1 % CREA (TRIAMCINOLONE ACETONIDE) Apply to affected area twice daily  #60 mg x 11   Entered and Authorized by:   Deatra Robinson MD   Signed by:   Deatra Robinson MD on 12/20/2009   Method used:   Print then Give to Patient   RxID:   [id number] COREG 12.5 MG TABS (CARVEDILOL) Take 1 tablet by mouth two times a day  #60 Tablet x 11   Entered and Authorized by:   Deatra Robinson MD   Signed by:   Deatra Robinson MD on 12/20/2009   Method used:   Print then Give to Patient   RxID:   [id number] VOLTAREN 1 % GEL (DICLOFENAC SODIUM) Apply three times a day as directed to painful joints   #200 Gram x 10   Entered and Authorized by:   Deatra Robinson MD   Signed by:   Deatra Robinson MD on 12/20/2009   Method used:   Print then Give to Patient   RxID:   [id number] GABAPENTIN 600 MG TABS (GABAPENTIN) Take 1 tablet by mouth three times a day or as directed  #100 Tablet x 5   Entered and Authorized by:   Deatra Robinson MD   Signed by:   Deatra Robinson MD on 12/20/2009   Method used:   Print then Give to Patient   RxID:   [id number] HYDROCODONE-ACETAMINOPHEN 7.5-325 MG TABS (HYDROCODONE-ACETAMINOPHEN) One tablet every 4 hours as needed for pain  #120 x 5   Entered and Authorized by:   Deatra Robinson MD   Signed by:   Deatra Robinson MD on 12/20/2009   Method used:   Print then Give to Patient   RxID:   [id number] ENALAPRIL-HYDROCHLOROTHIAZIDE 10-25 MG TABS (ENALAPRIL-HYDROCHLOROTHIAZIDE) Take two tablets by mouth in am  #60 x 11   Entered and Authorized by:   Deatra Robinson MD   Signed by:   Deatra Robinson MD on 12/20/2009   Method used:   Print then Give to Patient   RxID:   [id number] CRESTOR 10 MG TABS (ROSUVASTATIN CALCIUM) Take 1 tab by mouth at bedtime  #30 x 11   Entered and Authorized by:   Deatra Robinson MD   Signed by:   Deatra Robinson MD on 12/20/2009   Method used:   Print then Give to Patient   RxID:   [id number]   Prevention & Chronic Care Immunizations   Influenza vaccine: Fluvax MCR  (02/05/2009)   Influenza vaccine deferral: Deferred  (08/19/2009)   Influenza vaccine due: 01/06/2010    Tetanus booster: 08/07/2006: 08/07/2006   Td booster deferral: Deferred  (08/19/2009)   Tetanus booster due: 08/06/2016    Pneumococcal vaccine: Pneumovax (Medicare)  (01/22/2008)   Pneumococcal vaccine deferral: Not indicated  (12/20/2009)   Pneumococcal vaccine due: 01/21/2013    H. zoster vaccine: Not documented   H. zoster vaccine deferral: Not available  (08/19/2009)  Colorectal Screening   Hemoccult: Not documented    Hemoccult action/deferral: Deferred  (08/19/2009)    Colonoscopy: Not documented   Colonoscopy action/deferral: Deferred  (12/20/2009)   Colonoscopy due: 12/09/2018  Other Screening   PSA: Not documented   PSA action/deferral: Discussed-PSA declined  (12/20/2009)   PSA due due: 12/21/2010   Smoking status: quit  (12/20/2009)  Lipids   Total Cholesterol: 190  (06/21/2009)   Lipid panel action/deferral: Lipid Panel ordered   LDL: 129  (06/21/2009)   LDL Direct: Not documented  HDL: 38  (06/21/2009)   Triglycerides: 116  (06/21/2009)   Lipid panel due: 06/22/2010    SGOT (AST): 19  (06/21/2009)   SGPT (ALT): 12  (06/21/2009)   Alkaline phosphatase: 67  (06/21/2009)   Total bilirubin: 0.4  (06/21/2009)   Liver panel due: 06/21/2010    Lipid flowsheet reviewed?: Yes   Progress toward LDL goal: Unchanged  Hypertension   Last Blood Pressure: 189 / 94  (12/20/2009)   Serum creatinine: 1.25  (06/21/2009)   Serum potassium 3.7  (06/21/2009)   Basic metabolic panel due: 12/21/2010    Hypertension flowsheet reviewed?: Yes   Progress toward BP goal: Deteriorated  Self-Management Support :    Patient will work on the following items until the next clinic visit to reach self-care goals:     Medications and monitoring: take my medicines every day, bring all of my medications to every visit  (12/20/2009)     Eating: drink diet soda or water instead of juice or soda, eat more vegetables, use fresh or frozen vegetables, eat foods that are low in salt, eat baked foods instead of fried foods, eat fruit for snacks and desserts  (12/20/2009)     Activity: take a 30 minute walk every day  (12/20/2009)    Hypertension self-management support: Education handout, Resources for patients handout  (12/20/2009)   Hypertension education handout printed    Lipid self-management support: Education handout, Resources for patients handout  (12/20/2009)     Lipid education handout printed       Resource handout printed.

## 2010-06-07 NOTE — Assessment & Plan Note (Signed)
Summary: EST-ROUTINE CHECKUP/CH   Vital Signs:  Patient profile:   76 year old male Height:      71 inches  Vitals Entered By: Angelina Ok RN (March 10, 2010 11:00 AM)  Primary Care Provider:  Julaine Fusi  DO   History of Present Illness: Patient comes in today for routine follow-up and BP check. No new complaints-continues to have chronic pain for advanced osteorthritis requiring chronic opaies. His functional status is unchanged. He is legally blind.  Current Medications (verified): 1)  Hydrocodone-Acetaminophen 7.5-325 Mg Tabs (Hydrocodone-Acetaminophen) .... One Tablet Every 4 Hours As Needed For Pain 2)  Gabapentin 600 Mg Tabs (Gabapentin) .... Take 1/2 Tablet By Mouth Three Times A Day or As Directed 3)  Voltaren 1 % Gel (Diclofenac Sodium) .... Apply Three Times A Day As Directed To Painful Joints 4)  Enalapril-Hydrochlorothiazide 10-25 Mg Tabs (Enalapril-Hydrochlorothiazide) .... Take Two Tablets By Mouth in Am 5)  Aspirin 81 Mg Tbec (Aspirin) 6)  Coreg 12.5 Mg Tabs (Carvedilol) .... Take 1 Tablet By Mouth Two Times A Day 7)  Aspirin 81 Mg Tbec (Aspirin) 8)  Triamcinolone Acetonide 0.1 % Crea (Triamcinolone Acetonide) .... Apply To Affected Area Twice Daily 9)  Crestor 10 Mg Tabs (Rosuvastatin Calcium) .... Take 1 Tab By Mouth At Bedtime 10)  Cyclobenzaprine Hcl 5 Mg Tabs (Cyclobenzaprine Hcl) .... Two Times A Day As Needed 11)  Naprosyn 500 Mg Tabs (Naproxen) .... Take 1 Tablet By Mouth Two Times A Day 12)  Norvasc 5 Mg Tabs (Amlodipine Besylate) .... Take 1 Tablet By Mouth Once A Day 13)  Protonix 20 Mg Tbec (Pantoprazole Sodium) .... Take 1 Tablet By Mouth Once A Day 14)  Colace 100 Mg Caps (Docusate Sodium) .... Take 1 Tablet By Mouth Two Times A Day  Allergies (verified): No Known Drug Allergies  Review of Systems      See HPI  Physical Exam  General:  NAD Neck:  ttp left cervical region, Spurling maneuver negative, muscle spasms noted Lungs:  Normal  respiratory effort, chest expands symmetrically. Lungs are clear to auscultation, no crackles or wheezes. Heart:  Normal rate and regular rhythm. S1 and S2 normal without gallop, murmur, click, rub or other extra sounds. Abdomen:  soft, non-tender, and normal bowel sounds.   Msk:  normal ROM.     Impression & Recommendations:  Problem # 1:  HYPERTENSION, ESSENTIAL NOS (ICD-401.9) Still running high but much improved since his last visit.  Will increase his Coreg today.   His updated medication list for this problem includes:    Enalapril-hydrochlorothiazide 10-25 Mg Tabs (Enalapril-hydrochlorothiazide) .Marland Kitchen... Take two tablets by mouth in am    Carvedilol 25 Mg Tabs (Carvedilol) .Marland Kitchen... Take 1 tablet by mouth two times a day    Norvasc 5 Mg Tabs (Amlodipine besylate) .Marland Kitchen... Take 1 tablet by mouth once a day  Prior BP: 187/91 (02/07/2010)  Prior 10 Yr Risk Heart Disease: 40 % (12/10/2008)  Labs Reviewed: K+: 4.3 (02/17/2010) Creat: : 1.00 (02/17/2010)   Chol: 190 (06/21/2009)   HDL: 38 (06/21/2009)   LDL: 129 (06/21/2009)   TG: 116 (06/21/2009)  Problem # 2:  SPINAL STENOSIS, LUMBAR (ICD-724.02) Significant pain requiring opiates chronically. Stable on current dose. No change today.  Complete Medication List: 1)  Hydrocodone-acetaminophen 7.5-325 Mg Tabs (Hydrocodone-acetaminophen) .... One tablet every 4 hours as needed for pain 2)  Gabapentin 600 Mg Tabs (Gabapentin) .... Take 1/2 tablet by mouth three times a day or as directed 3)  Voltaren  1 % Gel (Diclofenac sodium) .... Apply three times a day as directed to painful joints 4)  Enalapril-hydrochlorothiazide 10-25 Mg Tabs (Enalapril-hydrochlorothiazide) .... Take two tablets by mouth in am 5)  Aspirin 81 Mg Tbec (Aspirin) 6)  Carvedilol 25 Mg Tabs (Carvedilol) .... Take 1 tablet by mouth two times a day 7)  Aspirin 81 Mg Tbec (Aspirin) 8)  Triamcinolone Acetonide 0.1 % Crea (Triamcinolone acetonide) .... Apply to affected area twice  daily 9)  Crestor 10 Mg Tabs (Rosuvastatin calcium) .... Take 1 tab by mouth at bedtime 10)  Cyclobenzaprine Hcl 5 Mg Tabs (Cyclobenzaprine hcl) .... Two times a day as needed 11)  Naprosyn 500 Mg Tabs (Naproxen) .... Take 1 tablet by mouth two times a day 12)  Norvasc 5 Mg Tabs (Amlodipine besylate) .... Take 1 tablet by mouth once a day 13)  Protonix 20 Mg Tbec (Pantoprazole sodium) .... Take 1 tablet by mouth once a day 14)  Colace 100 Mg Caps (Docusate sodium) .... Take 1 tablet by mouth two times a day   Orders Added: 1)  Est. Patient Level IV [16109]

## 2010-06-07 NOTE — Assessment & Plan Note (Signed)
Summary: FU/SB.   Vital Signs:  Patient profile:   76 year old male Height:      71 inches Weight:      251.2 pounds BMI:     35.16 Temp:     97.9 degrees F oral Pulse rate:   63 / minute BP sitting:   132 / 70  (right arm)  Vitals Entered By: Filomena Jungling NT II (April 12, 2010 2:18 PM) CC: HFU Is Patient Diabetic? No Pain Assessment Patient in pain? no      Nutritional Status BMI of > 30 = obese  Have you ever been in a relationship where you felt threatened, hurt or afraid?No   Does patient need assistance? Functional Status Self care Ambulation Normal   Primary Care Provider:  Julaine Fusi  DO  CC:  HFU.  History of Present Illness: 76 y/o man with pmh outlined below here on hospital followup. He is s/p ex-lap for an sbo in 03/11/10 by Dr. Magnus Ivan. Today, states that he does not have any complains, except for some persistent abdominal pain, which he states has been ongoing since surgery and describes it as a dull persistent ache. He has been following with Dr. Eliberto Ivory office since his surgery and today, states he hasn't had any intermittent fevers, sob, cp, n/v/d or other systemic symptoms. States that he has also been taking all his meds as scheduled.  Current Medications (verified): 1)  Hydrocodone-Acetaminophen 7.5-325 Mg Tabs (Hydrocodone-Acetaminophen) .... One Tablet Every 4 Hours As Needed For Pain 2)  Gabapentin 600 Mg Tabs (Gabapentin) .... Take 1/2 Tablet By Mouth Three Times A Day or As Directed 3)  Voltaren 1 % Gel (Diclofenac Sodium) .... Apply Three Times A Day As Directed To Painful Joints 4)  Enalapril-Hydrochlorothiazide 10-25 Mg Tabs (Enalapril-Hydrochlorothiazide) .... Take Two Tablets By Mouth in Am 5)  Aspirin 81 Mg Tbec (Aspirin) 6)  Carvedilol 12.5 Mg Tabs (Carvedilol) .... Take One Tablet By Mouth Two Times A Day 7)  Aspirin 81 Mg Tbec (Aspirin) 8)  Triamcinolone Acetonide 0.1 % Crea (Triamcinolone Acetonide) .... Apply To Affected Area  Twice Daily 9)  Crestor 10 Mg Tabs (Rosuvastatin Calcium) .... Take 1 Tab By Mouth At Bedtime 10)  Cyclobenzaprine Hcl 5 Mg Tabs (Cyclobenzaprine Hcl) .... Two Times A Day As Needed 11)  Naprosyn 500 Mg Tabs (Naproxen) .... Take 1 Tablet By Mouth Two Times A Day 12)  Norvasc 5 Mg Tabs (Amlodipine Besylate) .... Take 1 Tablet By Mouth Once A Day 13)  Protonix 20 Mg Tbec (Pantoprazole Sodium) .... Take 1 Tablet By Mouth Once A Day 14)  Colace 100 Mg Caps (Docusate Sodium) .... Take 1 Tablet By Mouth Two Times A Day  Allergies (verified): No Known Drug Allergies  Past History:  Past Medical History: Last updated: 12/04/2007 Blindness Gout Hypertension Anemia with a negative colonoscopy and long history of nonsteroidal anti-inflammatory drug use. Severe obstructive sleep apnea/hypopnea syndrome, respiratory disturbance index 52 per hour with mild oxygen desaturation  Family History: Last updated: 12/20/2009 No Heart Disease Both maternal grandparents: DM  Social History: Last updated: 06/10/2008 Married. Lives in [place] with sig.o. Marvene Staff Patient is legally Blind  Risk Factors: Exercise: yes (02/07/2010)  Risk Factors: Smoking Status: quit (02/07/2010)  Review of Systems      See HPI  Physical Exam  General:  alert.   Head:  normocephalic and atraumatic.   Eyes:  Blind Ears:  no external deformities.   Nose:  no external erythema and  no nasal discharge.   Neck:  supple.   Lungs:  normal respiratory effort, normal breath sounds, no crackles, and no wheezes.   Heart:  normal rate, regular rhythm, and no murmur.   Abdomen:  obese, normal bowel sounds. healed midline scar, no drainage or erythema, mild tenderness diffuseley Pulses:  normal peripheral pulses Extremities:  no edema or cyanosis Neurologic:  alert & oriented X3.   Skin:  color normal.   Psych:  normally interactive.     Impression & Recommendations:  Problem # 1:  HYPERTENSION, ESSENTIAL  NOS (ICD-401.9) Very well controlled. NO changes to his meds. He is doing very well overall, no post surgical complications, instructed to contact Dr. Eliberto Ivory office if he continues to have persistent or worsening abdominal pain or develops associated fevers.   His updated medication list for this problem includes:    Enalapril-hydrochlorothiazide 10-25 Mg Tabs (Enalapril-hydrochlorothiazide) .Marland Kitchen... Take two tablets by mouth in am    Carvedilol 12.5 Mg Tabs (Carvedilol) .Marland Kitchen... Take one tablet by mouth two times a day    Norvasc 5 Mg Tabs (Amlodipine besylate) .Marland Kitchen... Take 1 tablet by mouth once a day  BP today: 132/70 Prior BP: 187/91 (02/07/2010)  Prior 10 Yr Risk Heart Disease: 40 % (12/10/2008)  Labs Reviewed: K+: 4.3 (02/17/2010) Creat: : 1.00 (02/17/2010)   Chol: 190 (06/21/2009)   HDL: 38 (06/21/2009)   LDL: 129 (06/21/2009)   TG: 116 (06/21/2009)  Problem # 2:  PREVENTIVE HEALTH CARE (ICD-V70.0) Received flu shot in october.  Complete Medication List: 1)  Hydrocodone-acetaminophen 7.5-325 Mg Tabs (Hydrocodone-acetaminophen) .... One tablet every 4 hours as needed for pain 2)  Gabapentin 600 Mg Tabs (Gabapentin) .... Take 1/2 tablet by mouth three times a day or as directed 3)  Voltaren 1 % Gel (Diclofenac sodium) .... Apply three times a day as directed to painful joints 4)  Enalapril-hydrochlorothiazide 10-25 Mg Tabs (Enalapril-hydrochlorothiazide) .... Take two tablets by mouth in am 5)  Aspirin 81 Mg Tbec (Aspirin) 6)  Carvedilol 12.5 Mg Tabs (Carvedilol) .... Take one tablet by mouth two times a day 7)  Aspirin 81 Mg Tbec (Aspirin) 8)  Triamcinolone Acetonide 0.1 % Crea (Triamcinolone acetonide) .... Apply to affected area twice daily 9)  Crestor 10 Mg Tabs (Rosuvastatin calcium) .... Take 1 tab by mouth at bedtime 10)  Cyclobenzaprine Hcl 5 Mg Tabs (Cyclobenzaprine hcl) .... Two times a day as needed 11)  Naprosyn 500 Mg Tabs (Naproxen) .... Take 1 tablet by mouth two times  a day 12)  Norvasc 5 Mg Tabs (Amlodipine besylate) .... Take 1 tablet by mouth once a day 13)  Protonix 20 Mg Tbec (Pantoprazole sodium) .... Take 1 tablet by mouth once a day 14)  Colace 100 Mg Caps (Docusate sodium) .... Take 1 tablet by mouth two times a day  Patient Instructions: 1)  Please take ONLY the medications listed on your medication list. 2)  Pls make sure to call Dr. Eliberto Ivory office (your surgeon) if you are having worsening abdominal pain along with fevers. 3)  Pls don't hesitate to call us if you have any questions or concerns. 4)  Followup with DR.Phillips Odor in one month. 5)  Please schedule a follow-up appointment in 1 month.  Prescriptions: NORVASC 5 MG TABS (AMLODIPINE BESYLATE) Take 1 tablet by mouth once a day  #30 x 1   Entered and Authorized by:   Jaci Lazier MD   Signed by:   Jaci Lazier MD on 04/12/2010   Method used:  Print then Give to Patient   RxID:   [id number] NORVASC 5 MG TABS (AMLODIPINE BESYLATE) Take 1 tablet by mouth once a day  #30 x 1   Entered and Authorized by:   Jaci Lazier MD   Signed by:   Jaci Lazier MD on 04/12/2010   Method used:   Print then Give to Patient   RxID:   [id number]    Orders Added: 1)  Est. Patient Level III [10272]    Prevention & Chronic Care Immunizations   Influenza vaccine: Fluvax MCR  (02/07/2010)   Influenza vaccine deferral: Deferred  (08/19/2009)   Influenza vaccine due: 01/06/2010    Tetanus booster: 08/07/2006: 08/07/2006   Td booster deferral: Deferred  (08/19/2009)   Tetanus booster due: 08/06/2016    Pneumococcal vaccine: Pneumovax (Medicare)  (01/22/2008)   Pneumococcal vaccine deferral: Not indicated  (12/20/2009)   Pneumococcal vaccine due: 01/21/2013    H. zoster vaccine: Not documented   H. zoster vaccine deferral: Not available  (08/19/2009)  Colorectal Screening   Hemoccult: Not documented   Hemoccult action/deferral: Deferred  (08/19/2009)    Colonoscopy: Not  documented   Colonoscopy action/deferral: Deferred  (12/20/2009)   Colonoscopy due: 12/09/2018  Other Screening   PSA: Not documented   PSA action/deferral: Discussed-PSA declined  (12/20/2009)   PSA due due: 12/21/2010   Smoking status: quit  (02/07/2010)  Lipids   Total Cholesterol: 190  (06/21/2009)   Lipid panel action/deferral: Lipid Panel ordered   LDL: 129  (06/21/2009)   LDL Direct: Not documented   HDL: 38  (06/21/2009)   Triglycerides: 116  (06/21/2009)   Lipid panel due: 06/22/2010    SGOT (AST): 19  (06/21/2009)   SGPT (ALT): 12  (06/21/2009)   Alkaline phosphatase: 67  (06/21/2009)   Total bilirubin: 0.4  (06/21/2009)   Liver panel due: 06/21/2010  Hypertension   Last Blood Pressure: 132 / 70  (04/12/2010)   Serum creatinine: 1.00  (02/17/2010)   Serum potassium 4.3  (02/17/2010)   Basic metabolic panel due: 12/21/2010    Hypertension flowsheet reviewed?: Yes   Progress toward BP goal: At goal  Self-Management Support :   Personal Goals (by the next clinic visit) :      Personal blood pressure goal: 140/90  (02/07/2010)     Personal LDL goal: 100  (02/07/2010)    Hypertension self-management support: Written self-care plan, Education handout, Resources for patients handout  (02/07/2010)    Lipid self-management support: Written self-care plan, Education handout, Resources for patients handout  (02/07/2010)

## 2010-06-07 NOTE — Miscellaneous (Signed)
Summary: [facility]   Imported By: Margie Billet 09/07/2009 09:56:51  _____________________________________________________________________  External Attachment:    Type:   Image     Comment:   External Document

## 2010-06-07 NOTE — Assessment & Plan Note (Signed)
Summary: ROUTINE CK/EST/VS   Vital Signs:  Patient profile:   76 year old male Height:      71 inches (180.34 cm) Weight:      251.06 pounds (114.12 kg) BMI:     35.14 Temp:     98.7 degrees F (37.06 degrees C) oral Pulse rate:   88 / minute BP sitting:   244 / 124  (right arm)  Vitals Entered By: Angelina Ok RN (May 13, 2009 11:34 AM) CC: Depression Is Patient Diabetic? No Pain Assessment Patient in pain? yes     Location: right leg Intensity: 8 Type: aching, throbbing Onset of pain  Intermittent Nutritional Status BMI of > 30 = obese  Have you ever been in a relationship where you felt threatened, hurt or afraid?No   Does patient need assistance? Functional Status Self care, Cook/clean, Shopping, Social activities Ambulation Impaired:Risk for fall Comments Feet swelling.  Blind.  Needs something for itching.  Had Anti-itch Maximum strength 2% creme  prescription.  Repeat blood pressure  230/102.  Pt says he has no headaches.  Pt said that he took hais medication late usually takes at 6 am took medication1 hour ago.   Primary Care Provider:  Julaine Fusi  DO  CC:  Depression.  History of Present Illness: Nicholas [name] is in today for follow-up on his chronic pain from severe OA and for his hypertention. No new complaints today.  Depression History:      The patient denies a depressed mood most of the day and a diminished interest in his usual daily activities.        The patient denies that he feels like life is not worth living, denies that he wishes that he were dead, and denies that he has thought about ending his life.         Preventive Screening-Counseling & Management  Alcohol-Tobacco     Smoking Status: quit     Year Quit: 20 YEARS AGO  Current Medications (verified): 1)  Hydrocodone-Acetaminophen 7.5-325 Mg Tabs (Hydrocodone-Acetaminophen) .... One Tablet Every 4 Hours As Needed For Pain 2)  Gabapentin 600 Mg Tabs (Gabapentin) .... Take 1 Tablet By  Mouth Three Times A Day or As Directed 3)  Voltaren 1 % Gel (Diclofenac Sodium) .... Apply Three Times A Day As Directed To Painful Joints 4)  Enalapril-Hydrochlorothiazide 10-25 Mg Tabs (Enalapril-Hydrochlorothiazide) .... Take 1 Tablet By Mouth Once A Day 5)  Aspirin 81 Mg Tbec (Aspirin) 6)  Coreg 12.5 Mg Tabs (Carvedilol) .... Take 1 Tablet By Mouth Two Times A Day 7)  Aspirin 81 Mg Tbec (Aspirin) 8)  Triamcinolone Acetonide 0.1 % Crea (Triamcinolone Acetonide) .... Apply To Affected Area Twice Daily  Physical Exam  General:  Well-developed,well-nourished,in no acute distress; Legally Blind wearing dark glasses. Lungs:  Normal respiratory effort, chest expands symmetrically. Lungs are clear to auscultation, no crackles or wheezes. Heart:  Normal rate and regular rhythm. S1 and S2 normal without gallop, murmur, click, rub or other extra sounds. Msk:  pain on palpation of his low back. He has tight hamstrings and quads with a stiff gait and slightly hyperextended back.   Impression & Recommendations:  Problem # 1:  HYPERTENSION, ESSENTIAL NOS (ICD-401.9) His BP is extremely high today. I did a med rec and half of his BP meds are missing and it seems he has doubled up on his lisinopril. I have sent all of his meds including a list to his pharmacy. He will double check these when  he gets home. He must comne back in in 2 weeks for a BP check. I also advided him to be on ASA daily to reduce his stroke risk- especially with his BP being so high.  The following medications were removed from the medication list:    Toprol Xl 50 Mg Xr24h-tab (Metoprolol succinate) .Marland Kitchen... Take 1 tablet by mouth once a day His updated medication list for this problem includes:    Enalapril-hydrochlorothiazide 10-25 Mg Tabs (Enalapril-hydrochlorothiazide) .Marland Kitchen... Take 1 tablet by mouth once a day    Coreg 12.5 Mg Tabs (Carvedilol) .Marland Kitchen... Take 1 tablet by mouth two times a day  BP today: 244/124 Prior BP: 177/92  (12/10/2008)  Prior 10 Yr Risk Heart Disease: 40 % (12/10/2008)  Labs Reviewed: K+: 4.7 (10/21/2008) Creat: : 1.51 (10/21/2008)   Chol: 190 (12/18/2007)   HDL: 43 (12/18/2007)   LDL: 100 (12/18/2007)   TG: 236 (12/18/2007)  Problem # 2:  OSTEOARTHROSIS, GENERALIZED, MULTIPLE SITES (ICD-715.09) Will continue current pain regimen.  His updated medication list for this problem includes:    Hydrocodone-acetaminophen 7.5-325 Mg Tabs (Hydrocodone-acetaminophen) ..... One tablet every 4 hours as needed for pain    Aspirin 81 Mg Tbec (Aspirin)  Complete Medication List: 1)  Hydrocodone-acetaminophen 7.5-325 Mg Tabs (Hydrocodone-acetaminophen) .... One tablet every 4 hours as needed for pain 2)  Gabapentin 600 Mg Tabs (Gabapentin) .... Take 1 tablet by mouth three times a day or as directed 3)  Voltaren 1 % Gel (Diclofenac sodium) .... Apply three times a day as directed to painful joints 4)  Enalapril-hydrochlorothiazide 10-25 Mg Tabs (Enalapril-hydrochlorothiazide) .... Take 1 tablet by mouth once a day 5)  Aspirin 81 Mg Tbec (Aspirin) 6)  Coreg 12.5 Mg Tabs (Carvedilol) .... Take 1 tablet by mouth two times a day 7)  Aspirin 81 Mg Tbec (Aspirin) 8)  Triamcinolone Acetonide 0.1 % Crea (Triamcinolone acetonide) .... Apply to affected area twice daily  Patient Instructions: 1)  Please verify medication list with Pharmacy. 2)  Come back in 2 weeks for blood pressure check and labs. Prescriptions: TRIAMCINOLONE ACETONIDE 0.1 % CREA (TRIAMCINOLONE ACETONIDE) Apply to affected area twice daily  #1 tube x 3   Entered and Authorized by:   Julaine Fusi  DO   Signed by:   Julaine Fusi  DO on 05/13/2009   Method used:   Electronically to        [facility]  [address] [phone]* (retail)       [address]       [place], [place]  [id number]       Ph: [phone] or [phone]       Fax: [phone]   RxID:   [id number] ENALAPRIL-HYDROCHLOROTHIAZIDE 10-25 MG TABS  (ENALAPRIL-HYDROCHLOROTHIAZIDE) Take 1 tablet by mouth once a day  #31 x 0   Entered and Authorized by:   Julaine Fusi  DO   Signed by:   Julaine Fusi  DO on 05/13/2009   Method used:   [facility]ically to        [facility]  [address] [phone]* (retail)       [address]       [place], [place]  [id number]       Ph: [phone] or [phone]       Fax: [phone]   RxID:   [id number] COREG 12.5 MG TABS (CARVEDILOL) Take 1 tablet by mouth two times a  day  #60 x 0   Entered and Authorized by:   Julaine Fusi  DO   Signed by:   Julaine Fusi  DO on 05/13/2009   Method used:   [facility]        [facility]  [address] [phone]* (retail)       [address]       [place], [place]  [id number]       Ph: [phone] or [phone]       Fax: [phone]   RxID:   [id number]   Prevention & Chronic Care Immunizations   Influenza vaccine: Fluvax MCR  (02/05/2009)    Tetanus booster: Not documented    Pneumococcal vaccine: Pneumovax (Medicare)  (01/22/2008)    H. zoster vaccine: Not documented  Colorectal Screening   Hemoccult: Not documented    Colonoscopy: Not documented  Other Screening   PSA: Not documented   Smoking status: quit  (05/13/2009)  Lipids   Total Cholesterol: 190  (12/18/2007)   LDL: 100  (12/18/2007)   LDL Direct: Not documented   HDL: 43  (12/18/2007)   Triglycerides: 236  (12/18/2007)    SGOT (AST): 16  (12/18/2007)   SGPT (ALT): 18  (12/18/2007)   Alkaline phosphatase: 66  (12/18/2007)   Total bilirubin: 0.3  (12/18/2007)  Hypertension   Last Blood Pressure: 244 / 124  (05/13/2009)   Serum creatinine: 1.51  (10/21/2008)   Serum potassium 4.7  (10/21/2008)  Self-Management Support :    Patient will work on the following items until the next clinic visit to reach self-care goals:     Medications and monitoring: take my medicines every day, bring all of my medications to every  visit  (05/13/2009)     Eating: eat foods that are low in salt, eat baked foods instead of fried foods  (05/13/2009)    Hypertension self-management support: Not documented    Lipid self-management support: Not documented

## 2010-06-07 NOTE — Progress Notes (Signed)
Summary: med refill/gp  Phone Note Refill Request Message from:  Fax from Pharmacy on December 17, 2009 4:43 PM  Refills Requested: Medication #1:  HYDROCODONE-ACETAMINOPHEN 7.5-325 MG TABS One tablet every 4 hours as needed for pain   Last Refilled: 11/16/2009 Last appt. April 14.   Method Requested: Telephone to Pharmacy Initial call taken by: Chinita Pester RN,  December 17, 2009 4:43 PM  Follow-up for Phone Call        Refilled at his visit Follow-up by: Julaine Fusi  DO,  December 20, 2009 11:41 AM

## 2010-06-07 NOTE — Letter (Signed)
Summary: [facility]   Imported By: Margie Billet 10/21/2009 10:49:02  _____________________________________________________________________  External Attachment:    Type:   Image     Comment:   External Document

## 2010-06-07 NOTE — Assessment & Plan Note (Signed)
Summary: URGENT CARE F/U-NECK PAIN/CFB   Vital Signs:  Patient profile:   76 year old male Height:      71 inches (180.34 cm) Weight:      246.4 pounds (112 kg) BMI:     34.49 Temp:     97.3 degrees F (36.28 degrees C) oral Pulse rate:   70 / minute BP sitting:   187 / 91  (right arm)  Vitals Entered By: Stanton Kidney Ditzler RN (February 07, 2010 12:04 PM) Is Patient Diabetic? No Pain Assessment Patient in pain? yes     Location: behind left ear and down neck Intensity: 3 Type: sharp Onset of pain  months Nutritional Status BMI of 25 - 29 = overweight Nutritional Status Detail apptite varies  Have you ever been in a relationship where you felt threatened, hurt or afraid?denies   Does patient need assistance? Functional Status Self care Ambulation Impaired:Risk for fall Comments Pt is blind - nephew with pt. FU from Urgent Care - pain left ear and neck worse. Refills on meds. ? labs. FU BP.   Primary Care Provider:  Julaine Fusi  DO   History of Present Illness: 76 yr old man with pmhx as described below comes to the clinic complaining of neck pain that started 2 months ago. Localized to base left neck area and radiates up to his ear and down to his shoulder. Denies fever or chills, coughing. Alleviated by neurontin but reports that he does not use it very much because it causes drowsiness. Has been placing Voltaren Gel on which has helped. Went to urgent care where a xray was done with showed arthritis of C6-C7. Denies new weakness, tingling or bladder or bowel incontinence.  Complains of constipation although he had a regular bowel movement yesterday. Reports to have reflux.  Depression History:      The patient denies a depressed mood most of the day and a diminished interest in his usual daily activities.         Preventive Screening-Counseling & Management  Alcohol-Tobacco     Smoking Status: quit     Year Quit: 20 YEARS AGO  Caffeine-Diet-Exercise     Does Patient  Exercise: yes     Type of exercise: WALKING OUTSIDE     Times/week: 3  Problems Prior to Update: 1)  Neck Pain, Left  (ICD-723.1) 2)  Foot Pain, Bilateral  (ICD-729.5) 3)  Spinal Stenosis, Lumbar  (ICD-724.02) 4)  Cellulitis, Leg, Right  (ICD-682.6) 5)  Contact Dermatitis&other Eczema Due Unspec Cause  (ICD-692.9) 6)  Hypertension, Essential Nos  (ICD-401.9) 7)  Osteoarthrosis, Generalized, Multiple Sites  (ICD-715.09) 8)  Hip Pain, Right, Chronic  (ICD-719.45) 9)  Hyperlipidemia  (ICD-272.4) 10)  Sciatica  (ICD-724.3) 11)  Blindness  (ICD-369.4) 12)  Erectile Dysfunction  (ICD-302.72) 13)  Preventive Health Care  (ICD-V70.0)  Medications Prior to Update: 1)  Hydrocodone-Acetaminophen 7.5-325 Mg Tabs (Hydrocodone-Acetaminophen) .... One Tablet Every 4 Hours As Needed For Pain 2)  Gabapentin 600 Mg Tabs (Gabapentin) .... Take 1 Tablet By Mouth Three Times A Day or As Directed 3)  Voltaren 1 % Gel (Diclofenac Sodium) .... Apply Three Times A Day As Directed To Painful Joints 4)  Enalapril-Hydrochlorothiazide 10-25 Mg Tabs (Enalapril-Hydrochlorothiazide) .... Take Two Tablets By Mouth in Am 5)  Aspirin 81 Mg Tbec (Aspirin) 6)  Coreg 12.5 Mg Tabs (Carvedilol) .... Take 1 Tablet By Mouth Two Times A Day 7)  Aspirin 81 Mg Tbec (Aspirin) 8)  Triamcinolone Acetonide 0.1 %  Crea (Triamcinolone Acetonide) .... Apply To Affected Area Twice Daily 9)  Crestor 10 Mg Tabs (Rosuvastatin Calcium) .... Take 1 Tab By Mouth At Bedtime 10)  Cyclobenzaprine Hcl 5 Mg Tabs (Cyclobenzaprine Hcl) .... Two Times A Day As Needed  Current Medications (verified): 1)  Hydrocodone-Acetaminophen 7.5-325 Mg Tabs (Hydrocodone-Acetaminophen) .... One Tablet Every 4 Hours As Needed For Pain 2)  Gabapentin 600 Mg Tabs (Gabapentin) .... Take 1 Tablet By Mouth Three Times A Day or As Directed 3)  Voltaren 1 % Gel (Diclofenac Sodium) .... Apply Three Times A Day As Directed To Painful Joints 4)   Enalapril-Hydrochlorothiazide 10-25 Mg Tabs (Enalapril-Hydrochlorothiazide) .... Take Two Tablets By Mouth in Am 5)  Aspirin 81 Mg Tbec (Aspirin) 6)  Coreg 12.5 Mg Tabs (Carvedilol) .... Take 1 Tablet By Mouth Two Times A Day 7)  Aspirin 81 Mg Tbec (Aspirin) 8)  Triamcinolone Acetonide 0.1 % Crea (Triamcinolone Acetonide) .... Apply To Affected Area Twice Daily 9)  Crestor 10 Mg Tabs (Rosuvastatin Calcium) .... Take 1 Tab By Mouth At Bedtime 10)  Cyclobenzaprine Hcl 5 Mg Tabs (Cyclobenzaprine Hcl) .... Two Times A Day As Needed  Allergies: No Known Drug Allergies  Directives: 1)  Full Code   Past History:  Past Medical History: Last updated: 12/04/2007 Blindness Gout Hypertension Anemia with a negative colonoscopy and long history of nonsteroidal anti-inflammatory drug use. Severe obstructive sleep apnea/hypopnea syndrome, respiratory disturbance index 52 per hour with mild oxygen desaturation  Family History: Last updated: 12/20/2009 No Heart Disease Both maternal grandparents: DM  Social History: Last updated: 06/10/2008 Married. Lives in [place] with sig.o. Marvene Staff Patient is legally Blind  Risk Factors: Exercise: yes (02/07/2010)  Risk Factors: Smoking Status: quit (02/07/2010)  Family History: Reviewed history from 12/20/2009 and no changes required. No Heart Disease Both maternal grandparents: DM  Social History: Reviewed history from 06/10/2008 and no changes required. Married. Lives in [place] with sig.o. Marvene Staff Patient is legally Blind  Review of Systems  The patient denies fever, chest pain, dyspnea on exertion, peripheral edema, hemoptysis, abdominal pain, melena, hematochezia, hematuria, and muscle weakness.    Physical Exam  General:  NAD Eyes:  Blind Neck:  ttp left cervical region, Spurling maneuver negative, muscle spasms noted Lungs:  Normal respiratory effort, chest expands symmetrically. Lungs are clear to  auscultation, no crackles or wheezes. Heart:  Normal rate and regular rhythm. S1 and S2 normal without gallop, murmur, click, rub or other extra sounds. Abdomen:  soft, non-tender, and normal bowel sounds.   Msk:  normal ROM.   Extremities:  no edema Neurologic:  NOnfocal   Impression & Recommendations:  Problem # 1:  NECK PAIN, LEFT (ICD-723.1) Most likely 2/2 Degenerative disease of C6-C7. Patient was instructed to use Naproxen and Flexeril for pain. If not relieved he can then try Hydrocodone and if he still has pain he can use Neurontin but at a lower dose. 2/2 to patient's history of previous renal insufficiency will have patient return to the clinic next week  to recheck bmet. If creatinine elevated will dc NSAIDS.  His updated medication list for this problem includes:    Hydrocodone-acetaminophen 7.5-325 Mg Tabs (Hydrocodone-acetaminophen) ..... One tablet every 4 hours as needed for pain    Aspirin 81 Mg Tbec (Aspirin)    Cyclobenzaprine Hcl 5 Mg Tabs (Cyclobenzaprine hcl) .Marland Kitchen..Marland Kitchen Two times a day as needed    Naprosyn 500 Mg Tabs (Naproxen) .Marland Kitchen... Take 1 tablet by mouth two times a day  Problem #  2:  HYPERTENSION, ESSENTIAL NOS (ICD-401.9)  BP elevated. Will add norvasc and recheck on follow up.  His updated medication list for this problem includes:    Enalapril-hydrochlorothiazide 10-25 Mg Tabs (Enalapril-hydrochlorothiazide) .Marland Kitchen... Take two tablets by mouth in am    Coreg 12.5 Mg Tabs (Carvedilol) .Marland Kitchen... Take 1 tablet by mouth two times a day    Norvasc 5 Mg Tabs (Amlodipine besylate) .Marland Kitchen... Take 1 tablet by mouth once a day  BP today: 187/91 Prior BP: 189/94 (12/20/2009)  Prior 10 Yr Risk Heart Disease: 40 % (12/10/2008)  Labs Reviewed: K+: 3.7 (06/21/2009) Creat: : 1.25 (06/21/2009)   Chol: 190 (06/21/2009)   HDL: 38 (06/21/2009)   LDL: 129 (06/21/2009)   TG: 116 (06/21/2009)  Future Orders: T-Basic Metabolic Panel [id number]) ... 02/14/2010  Problem # 3:   HYPERLIPIDEMIA (ICD-272.4) Check FLP on follow up.  His updated medication list for this problem includes:    Crestor 10 Mg Tabs (Rosuvastatin calcium) .Marland Kitchen... Take 1 tab by mouth at bedtime  Labs Reviewed: SGOT: 19 (06/21/2009)   SGPT: 12 (06/21/2009)  Lipid Goals: Chol Goal: 200 (12/10/2008)   HDL Goal: 40 (12/10/2008)   LDL Goal: 100 (12/10/2008)   TG Goal: 150 (12/10/2008)  Prior 10 Yr Risk Heart Disease: 40 % (12/10/2008)   HDL:38 (06/21/2009), 43 (12/18/2007)  LDL:129 (06/21/2009), 100 (12/18/2007)  Chol:190 (06/21/2009), 190 (12/18/2007)  Trig:116 (06/21/2009), 236 (12/18/2007)  Problem # 4:  CONSTIPATION (ICD-564.00) may be due to pain meds. Start patient on stool softener.  His updated medication list for this problem includes:    Colace 100 Mg Caps (Docusate sodium) .Marland Kitchen... Take 1 tablet by mouth two times a day  Problem # 5:  GERD (ICD-530.81) Start on PPi and reassess on follow up.  His updated medication list for this problem includes:    Protonix 20 Mg Tbec (Pantoprazole sodium) .Marland Kitchen... Take 1 tablet by mouth once a day  Complete Medication List: 1)  Hydrocodone-acetaminophen 7.5-325 Mg Tabs (Hydrocodone-acetaminophen) .... One tablet every 4 hours as needed for pain 2)  Gabapentin 600 Mg Tabs (Gabapentin) .... Take 1/2 tablet by mouth three times a day or as directed 3)  Voltaren 1 % Gel (Diclofenac sodium) .... Apply three times a day as directed to painful joints 4)  Enalapril-hydrochlorothiazide 10-25 Mg Tabs (Enalapril-hydrochlorothiazide) .... Take two tablets by mouth in am 5)  Aspirin 81 Mg Tbec (Aspirin) 6)  Coreg 12.5 Mg Tabs (Carvedilol) .... Take 1 tablet by mouth two times a day 7)  Aspirin 81 Mg Tbec (Aspirin) 8)  Triamcinolone Acetonide 0.1 % Crea (Triamcinolone acetonide) .... Apply to affected area twice daily 9)  Crestor 10 Mg Tabs (Rosuvastatin calcium) .... Take 1 tab by mouth at bedtime 10)  Cyclobenzaprine Hcl 5 Mg Tabs (Cyclobenzaprine hcl) .... Two  times a day as needed 11)  Naprosyn 500 Mg Tabs (Naproxen) .... Take 1 tablet by mouth two times a day 12)  Norvasc 5 Mg Tabs (Amlodipine besylate) .... Take 1 tablet by mouth once a day 13)  Protonix 20 Mg Tbec (Pantoprazole sodium) .... Take 1 tablet by mouth once a day 14)  Colace 100 Mg Caps (Docusate sodium) .... Take 1 tablet by mouth two times a day  Other Orders: Influenza Vaccine MCR ([id number])  Patient Instructions: 1)  Please schedule a follow-up appointment in 1 month. 2)  Take all medication as directed. Prescriptions: GABAPENTIN 600 MG TABS (GABAPENTIN) Take 1/2 tablet by mouth three times a day or as directed  #60  x 2   Entered and Authorized by:   Laren Everts MD   Signed by:   Laren Everts MD on 02/07/2010   Method used:   [facility]        [facility] [address] [phone]* (retail)       [address]       [place], [place]  [address]       Ph: [phone] or [phone]       Fax: [phone]   RxID:   [id number] COLACE 100 MG CAPS (DOCUSATE SODIUM) Take 1 tablet by mouth two times a day  #60 x 3   Entered and Authorized by:   Laren Everts MD   Signed by:   Laren Everts MD on 02/07/2010   Method used:   [facility]        [facility]  [address] [phone]* (retail)       [address]       [place], [place]  [address]       Ph: [phone] or [phone]       Fax: [phone]   RxID:   [id number] NORVASC 5 MG TABS (AMLODIPINE BESYLATE) Take 1 tablet by mouth once a day  #30 x 1   Entered and Authorized by:   Laren Everts MD   Signed by:   Laren Everts MD on 02/07/2010   Method used:   [facility] to        [facility] [phone]* (retail)       [address]       [place], [place]  [address]       Ph: [phone] or [phone]       Fax: [phone]   RxID:   [id number] PROTONIX 20  MG TBEC (PANTOPRAZOLE SODIUM) Take 1 tablet by mouth once a day  #30 x 3   Entered and Authorized by:   Laren Everts MD   Signed by:   Laren Everts MD on 02/07/2010   Method used:   Electronically to        [facility] [phone]* (retail)       [address]       [place], [place]  [address]       Ph: [phone] or [phone]       Fax: [phone]   RxID:   [id number] NAPROSYN 500 MG TABS (NAPROXEN) Take 1 tablet by mouth two times a day  #36 x 1   Entered and Authorized by:   Laren Everts MD   Signed by:   Laren Everts MD on 02/07/2010   Method used:   [facility]ronically to        [facility] [phone]* (retail)       [address]       [place], [place]  [address]       Ph: [phone] or [phone]       Fax: [phone]   RxID:   [id number] VOLTAREN 1 % GEL (DICLOFENAC SODIUM) Apply three times a day as directed to painful joints  #200 Gram x 10   Entered and Authorized by:   Laren Everts MD   Signed by:   Laren Everts MD on 02/07/2010  Method used:   Electronically to        [facility] [phone]* (retail)       [address]       [place], [place]  [address]       Ph: [phone] or [phone]       Fax: [phone]   RxID:   [id number] TRIAMCINOLONE ACETONIDE 0.1 % CREA (TRIAMCINOLONE ACETONIDE) Apply to affected area twice daily  #60 mg x 11   Entered and Authorized by:   Laren Everts MD   Signed by:   Laren Everts MD on 02/07/2010   Method used:   [facility] to        [facility] [address] [phone]* (retail)       [address]       [place], [place]  [address]       Ph: [phone] or [phone]       Fax: [phone]   RxID:   [id number] CRESTOR 10 MG TABS (ROSUVASTATIN CALCIUM) Take 1 tab by mouth at bedtime  #31.0 Tablet x  10   Entered and Authorized by:   Laren Everts MD   Signed by:   Laren Everts MD on 02/07/2010   Method used:   Electronically to        [facility] [phone]* (retail)       [address]       [place], [place]  [address]       Ph: [phone] or [phone]       Fax: [phone]   RxID:   [id number] CYCLOBENZAPRINE HCL 5 MG TABS (CYCLOBENZAPRINE HCL) two times a day as needed  #60 Tablet x 0   Entered and Authorized by:   Laren Everts MD   Signed by:   Laren Everts MD on 02/07/2010   Method used:   Electronically to        [facility] [phone]* (retail)       [address]       [place], [place]  [address]       Ph: [phone] or [phone]       Fax: [phone]   RxID:   [id number] COREG 12.5 MG TABS (CARVEDILOL) Take 1 tablet by mouth two times a day  #60.0 Tablet x 10   Entered and Authorized by:   Laren Everts MD   Signed by:   Laren Everts MD on 02/07/2010   Method used:   Electronically to        [facility] [phone]* (retail)       [address]       [place], [place]  [address]       Ph: [phone] or [phone]       Fax: [phone]   RxID:   [id number] ENALAPRIL-HYDROCHLOROTHIAZIDE 10-25 MG TABS (ENALAPRIL-HYDROCHLOROTHIAZIDE) Take two tablets by mouth in am  #60 x 11   Entered and Authorized by:   Laren Everts MD   Signed by:   Laren Everts MD on 02/07/2010   Method used:   Electronically to        [facility] [phone]* (retail)       [address]  [place], [place]  [address]       Ph: [phone] or [phone]       Fax: [phone]   RxID:   [id number]  Process Orders Check Orders Results:     [facility]: Check successful Tests Sent for requisitioning (February 08, 2010 1:09 PM):      02/14/2010: [facility] -- T-Basic Metabolic Panel [phone] (signed)     Prevention & Chronic Care Immunizations   Influenza vaccine: Fluvax MCR  (02/07/2010)   Influenza vaccine deferral: Deferred  (08/19/2009)   Influenza vaccine due: 01/06/2010    Tetanus booster: 08/07/2006: 08/07/2006   Td booster deferral: Deferred  (08/19/2009)   Tetanus booster due: 08/06/2016    Pneumococcal vaccine: Pneumovax (Medicare)  (01/22/2008)   Pneumococcal vaccine deferral: Not indicated  (12/20/2009)   Pneumococcal vaccine due: 01/21/2013    H. zoster vaccine: Not documented   H. zoster vaccine deferral: Not available  (08/19/2009)  Colorectal Screening   Hemoccult: Not documented   Hemoccult action/deferral: Deferred  (08/19/2009)    Colonoscopy: Not documented   Colonoscopy action/deferral: Deferred  (12/20/2009)   Colonoscopy due: 12/09/2018  Other Screening   PSA: Not documented   PSA action/deferral: Discussed-PSA declined  (12/20/2009)   PSA due due: 12/21/2010   Smoking status: quit  (02/07/2010)  Lipids   Total Cholesterol: 190  (06/21/2009)   Lipid panel action/deferral: Lipid Panel ordered   LDL: 129  (06/21/2009)   LDL Direct: Not documented   HDL: 38  (06/21/2009)   Triglycerides: 116  (06/21/2009)   Lipid panel due: 06/22/2010    SGOT (AST): 19  (06/21/2009)   SGPT (ALT): 12  (06/21/2009)   Alkaline phosphatase: 67  (06/21/2009)   Total bilirubin: 0.4  (06/21/2009)   Liver panel due: 06/21/2010  Hypertension   Last Blood Pressure: 187 / 91  (02/07/2010)   Serum creatinine: 1.25  (06/21/2009)   Serum potassium 3.7  (06/21/2009)   Basic metabolic panel due: 12/21/2010  Self-Management Support :   Personal Goals (by the next clinic visit) :      Personal blood pressure goal: 140/90  (02/07/2010)     Personal LDL goal: 100  (02/07/2010)    Patient will work on the following items until the next clinic visit to reach self-care goals:      Medications and monitoring: take my medicines every day, bring all of my medications to every visit  (02/07/2010)     Eating: drink diet soda or water instead of juice or soda, eat more vegetables, use fresh or frozen vegetables, eat fruit for snacks and desserts, limit or avoid alcohol  (02/07/2010)     Activity: take a 30 minute walk every day  (02/07/2010)    Hypertension self-management support: Written self-care plan, Education handout, Resources for patients handout  (02/07/2010)   Hypertension self-care plan printed.   Hypertension education handout printed    Lipid self-management support: Written self-care plan, Education handout, Resources for patients handout  (02/07/2010)   Lipid self-care plan printed.   Lipid education handout printed      Resource handout printed.    Influenza Vaccine    Vaccine Type: Fluvax MCR    Site: right deltoid    Mfr: [employer]    Dose: 0.5 ml    Route: IM    Given by: Stanton Kidney Ditzler RN    Exp. Date: 11/05/2010    Lot #: [id number]    VIS given: 11/30/09  version given February 07, 2010.  Flu Vaccine Consent Questions    Do you have a history of severe allergic reactions to this vaccine? no    Any prior history of allergic reactions to egg and/or gelatin? no    Do you have a sensitivity to the preservative Thimersol? no    Do you have a past history of Guillan-Barre Syndrome? no    Do you currently have an acute febrile illness? no    Have you ever had a severe reaction to latex? no    Vaccine information given and explained to patient? yes

## 2010-06-07 NOTE — Progress Notes (Signed)
Summary: Refill/gh  Phone Note Refill Request Message from:  Fax from Pharmacy on June 10, 2009 11:13 AM  Refills Requested: Medication #1:  HYDROCODONE-ACETAMINOPHEN 7.5-325 MG TABS One tablet every 4 hours as needed for pain   Last Refilled: 04/23/2009  Method Requested: Electronic Initial call taken by: Angelina Ok RN,  June 10, 2009 11:13 AM  Follow-up for Phone Call        ok to refill-may call in Follow-up by: Julaine Fusi  DO,  June 10, 2009 2:28 PM  Additional Follow-up for Phone Call Additional follow up Details #1::        Rx faxed to pharmacy Additional Follow-up by: Angelina Ok RN,  June 11, 2009 2:37 PM    Prescriptions: HYDROCODONE-ACETAMINOPHEN 7.5-325 MG TABS (HYDROCODONE-ACETAMINOPHEN) One tablet every 4 hours as needed for pain  #120 x 5   Entered and Authorized by:   Julaine Fusi  DO   Signed by:   Julaine Fusi  DO on 06/10/2009   Method used:   Telephoned to ...       [facility] [phone]* (retail)       [address]       [place], [place]  [address]       Ph: [phone] or [phone]       Fax: [phone]   RxID:   [id number]

## 2010-06-07 NOTE — Miscellaneous (Signed)
Summary: [facility]  [facility]   Imported By: Florinda Marker 07/27/2009 14:48:59  _____________________________________________________________________  External Attachment:    Type:   Image     Comment:   External Document

## 2010-06-07 NOTE — Assessment & Plan Note (Signed)
Summary: F/U FOR BP CHECK/CH   Vital Signs:  Patient profile:   76 year old male Height:      71 inches (180.34 cm) Weight:      252.04 pounds (114.56 kg) BMI:     35.28 Temp:     97..8 degrees F (-17.78 degrees C) oral Pulse rate:   65 / minute BP sitting:   158 / 87  (right arm)  Vitals Entered By: Angelina Ok RN (June 21, 2009 9:28 AM) Is Patient Diabetic? No Pain Assessment Patient in pain? no      Nutritional Status BMI of > 30 = obese  Have you ever been in a relationship where you felt threatened, hurt or afraid?No   Does patient need assistance? Functional Status Self care, Cook/clean, Shopping, Social activities Ambulation Impaired:Risk for fall Comments Pt is blind.  Check up.  B/P check and labs.   Primary Care Provider:  Julaine Fusi  DO   History of Present Illness: Nicholas [name] comes in today for a re-check on his BP. He has been taking his medication as prescribed. He still has issues with chronic pain related to advanced OA.   Depression History:      The patient denies a depressed mood most of the day and a diminished interest in his usual daily activities.        The patient denies that he feels like life is not worth living, denies that he wishes that he were dead, and denies that he has thought about ending his life.         Preventive Screening-Counseling & Management  Alcohol-Tobacco     Smoking Status: quit     Year Quit: 20 YEARS AGO  Current Medications (verified): 1)  Hydrocodone-Acetaminophen 7.5-325 Mg Tabs (Hydrocodone-Acetaminophen) .... One Tablet Every 4 Hours As Needed For Pain 2)  Gabapentin 600 Mg Tabs (Gabapentin) .... Take 1 Tablet By Mouth Three Times A Day or As Directed 3)  Voltaren 1 % Gel (Diclofenac Sodium) .... Apply Three Times A Day As Directed To Painful Joints 4)  Enalapril-Hydrochlorothiazide 10-25 Mg Tabs (Enalapril-Hydrochlorothiazide) .... Take 1 Tablet By Mouth Once A Day 5)  Aspirin 81 Mg Tbec (Aspirin) 6)   Coreg 12.5 Mg Tabs (Carvedilol) .... Take 1 Tablet By Mouth Two Times A Day 7)  Aspirin 81 Mg Tbec (Aspirin) 8)  Triamcinolone Acetonide 0.1 % Crea (Triamcinolone Acetonide) .... Apply To Affected Area Twice Daily 9)  Ms Contin 15 Mg Xr12h-Tab (Morphine Sulfate) .... Take 1 Tablet By Mouth Two Times A Day  Allergies (verified): No Known Drug Allergies  Review of Systems  The patient denies anorexia, fever, weight loss, hoarseness, chest pain, syncope, peripheral edema, prolonged cough, and abdominal pain.    Physical Exam  General:  Well-developed,well-nourished,in no acute distress; Legally Blind wearing dark glasses. Lungs:  Normal respiratory effort, chest expands symmetrically. Lungs are clear to auscultation, no crackles or wheezes. Heart:  Normal rate and regular rhythm. S1 and S2 normal without gallop, murmur, click, rub or other extra sounds. Msk:  pain on palpation of his low back. He has tight hamstrings and quads with a stiff gait and slightly hyperextended back. Neurologic:  alert & oriented X3, cranial nerves II-XII intact, strength normal in all extremities, and sensation intact to light touch.     Impression & Recommendations:  Problem # 1:  OSTEOARTHROSIS, GENERALIZED, MULTIPLE SITES (ICD-715.09) At this point he needs to go on long acting pain medication. He has poor pain control on  as needed hydrocodone. I spent >30 minutes discussing pain control and expectations of prescribing for and treating his pain. He signed a Community education officer today.  His updated medication list for this problem includes:    Hydrocodone-acetaminophen 7.5-325 Mg Tabs (Hydrocodone-acetaminophen) ..... One tablet every 4 hours as needed for pain    Aspirin 81 Mg Tbec (Aspirin)    Ms Contin 15 Mg Xr12h-tab (Morphine sulfate) .Marland Kitchen... Take 1 tablet by mouth two times a day  Problem # 2:  HYPERTENSION, ESSENTIAL NOS (ICD-401.9) BP much better today on his meds. No quite at goal but I am trying to keep his  regimen as simple as possible right now- will titrate up BB at next visit.  His updated medication list for this problem includes:    Enalapril-hydrochlorothiazide 10-25 Mg Tabs (Enalapril-hydrochlorothiazide) .Marland Kitchen... Take 1 tablet by mouth once a day    Coreg 12.5 Mg Tabs (Carvedilol) .Marland Kitchen... Take 1 tablet by mouth two times a day  BP today: 158/87 Prior BP: 244/124 (05/13/2009)  Prior 10 Yr Risk Heart Disease: 40 % (12/10/2008)  Labs Reviewed: K+: 4.7 (10/21/2008) Creat: : 1.51 (10/21/2008)   Chol: 190 (12/18/2007)   HDL: 43 (12/18/2007)   LDL: 100 (12/18/2007)   TG: 236 (12/18/2007)  Problem # 3:  HYPERLIPIDEMIA (ICD-272.4) Will check lipids today and CMET.  Orders: T-Comprehensive Metabolic Panel [id number]) T-Lipid Profile [id number]) T-CBC w/Diff [id number])  Labs Reviewed: SGOT: 16 (12/18/2007)   SGPT: 18 (12/18/2007)  Lipid Goals: Chol Goal: 200 (12/10/2008)   HDL Goal: 40 (12/10/2008)   LDL Goal: 100 (12/10/2008)   TG Goal: 150 (12/10/2008)  Prior 10 Yr Risk Heart Disease: 40 % (12/10/2008)   HDL:43 (12/18/2007), 33 (04/11/2006)  LDL:100 (12/18/2007), 71 (43/32/9518)  Chol:190 (12/18/2007), 115 (04/11/2006)  Trig:236 (12/18/2007), 54 (04/11/2006)  Complete Medication List: 1)  Hydrocodone-acetaminophen 7.5-325 Mg Tabs (Hydrocodone-acetaminophen) .... One tablet every 4 hours as needed for pain 2)  Gabapentin 600 Mg Tabs (Gabapentin) .... Take 1 tablet by mouth three times a day or as directed 3)  Voltaren 1 % Gel (Diclofenac sodium) .... Apply three times a day as directed to painful joints 4)  Enalapril-hydrochlorothiazide 10-25 Mg Tabs (Enalapril-hydrochlorothiazide) .... Take 1 tablet by mouth once a day 5)  Aspirin 81 Mg Tbec (Aspirin) 6)  Coreg 12.5 Mg Tabs (Carvedilol) .... Take 1 tablet by mouth two times a day 7)  Aspirin 81 Mg Tbec (Aspirin) 8)  Triamcinolone Acetonide 0.1 % Crea (Triamcinolone acetonide) .... Apply to affected area twice daily 9)  Ms  Contin 15 Mg Xr12h-tab (Morphine sulfate) .... Take 1 tablet by mouth two times a day  Patient Instructions: 1)  Please schedule a follow-up appointment in 1 month with Goldinmg ok to add on Prescriptions: ENALAPRIL-HYDROCHLOROTHIAZIDE 10-25 MG TABS (ENALAPRIL-HYDROCHLOROTHIAZIDE) Take 1 tablet by mouth once a day  #31 x 0   Entered and Authorized by:   Julaine Fusi  DO   Signed by:   Julaine Fusi  DO on 06/21/2009   Method used:   Electronically to        [facility]  [address] [phone]* (retail)       [address]       [place], [place]  [id number]       Ph: [phone] or [phone]       Fax: [phone]   RxID:   [id number] MS CONTIN 15 MG XR12H-TAB (MORPHINE SULFATE) Take 1 tablet by mouth two times a day  #  60 x 0   Entered and Authorized by:   Julaine Fusi  DO   Signed by:   Julaine Fusi  DO on 06/21/2009   Method used:   Print then Give to Patient   RxID:   [id number] ENALAPRIL-HYDROCHLOROTHIAZIDE 10-25 MG TABS (ENALAPRIL-HYDROCHLOROTHIAZIDE) Take 1 tablet by mouth once a day  #31 x 0   Entered and Authorized by:   Julaine Fusi  DO   Signed by:   Julaine Fusi  DO on 06/21/2009   Method used:   [facility] to        [facility]  [address] [phone]* (retail)       [address]       [place], [place]  [id number]       Ph: [phone] or [phone]       Fax: [phone]   RxID:   [id number]   Prevention & Chronic Care Immunizations   Influenza vaccine: Fluvax MCR  (02/05/2009)    Tetanus booster: Not documented    Pneumococcal vaccine: Pneumovax (Medicare)  (01/22/2008)    H. zoster vaccine: Not documented  Colorectal Screening   Hemoccult: Not documented    Colonoscopy: Not documented  Other Screening   PSA: Not documented   Smoking status: quit  (06/21/2009)  Lipids   Total Cholesterol: 190  (12/18/2007)   LDL: 100  (12/18/2007)   LDL Direct: Not documented   HDL: 43   (12/18/2007)   Triglycerides: 236  (12/18/2007)    SGOT (AST): 16  (12/18/2007)   SGPT (ALT): 18  (12/18/2007) CMP ordered    Alkaline phosphatase: 66  (12/18/2007)   Total bilirubin: 0.3  (12/18/2007)  Hypertension   Last Blood Pressure: 158 / 87  (06/21/2009)   Serum creatinine: 1.51  (10/21/2008)   Serum potassium 4.7  (10/21/2008) CMP ordered   Self-Management Support :    Patient will work on the following items until the next clinic visit to reach self-care goals:     Medications and monitoring: take my medicines every day, bring all of my medications to every visit  (06/21/2009)     Eating: drink diet soda or water instead of juice or soda, eat more vegetables, eat foods that are low in salt, eat baked foods instead of fried foods  (06/21/2009)     Activity: take a 30 minute walk every day  (06/21/2009)    Hypertension self-management support: Not documented    Lipid self-management support: Not documented   Process Orders Check Orders Results:     [facility]: Check successful Tests Sent for requisitioning (June 23, 2009 4:58 PM):     06/21/2009: [facility] -- T-Comprehensive Metabolic Panel [[id number]] (signed)     06/21/2009: [facility] -- T-Lipid Profile [id number] (signed)     06/21/2009: [facility] -- [facility] w/Diff [[id number]] (signed)

## 2010-06-07 NOTE — Miscellaneous (Signed)
Summary: Hospital Admission/consult note.  INTERNAL MEDICINE ADMISSION HISTORY AND PHYSICAL PCP: Dr. Phillips Odor MD requesting consult: Dr. Magnus Ivan (Surgery)  Reason for consult: HTN managment  HPI: [name] is a 76 year old black male who is blind that has a past medical history including hypertension and hyperlipidemia, who developed abdominal pain and bloating 03/09/2010.   Because  of these symptoms, the patient presented to the emergency department  where he had a CT scan, which revealed a high-grade small  bowel obstruction.  Exploratory laparotomy with adhesion lysis was performed by Dr. Magnus Ivan on 03/11/2010. sPatient underwent urgery without complications. Post -operatively, patient's blood pressure control deteriorated , most likely due to disconitnuation of his regular anti-HTN-meds (Norvasc, Zestoretic and Coreg) prior to surgery. Because of this finding, Internal Medicine teaching team was requested to consult the patient.  ALLERGIES: NKDA  PAST MEDICAL HISTORY: Blindness Gout Hypertension Anemia with a negative colonoscopy and long history of nonsteroidal anti-inflammatory drug use. Baseline Hgb 11-12.3 Severe obstructive sleep apnea/hypopnea syndrome, respiratory disturbance index 52 per hour with mild oxygen desaturation. Last PFT's(?)  MEDICATIONS: Clonidine 0.1 mg TD q 7 d (11/2) Hydralazine IV as needed Heparin 5000 Sq q 8 SCD's Tylenol as needed Benadryl Zofran as needed COLACE 100 MG CAPS (DOCUSATE SODIUM) Take 1 tablet by mouth two times a day   SOCIAL HISTORY: Married. Lives in [place] with sig.o. Marvene Staff Patient is legally Blind  FAMILY HISTORY  No Heart Disease Both maternal grandparents: DM  ROS: per HPI  VITALS: T: 98.1 P:80  BP:205/119->156/90  R:18  O2SAT:  ON:94% RA  PHYSICAL EXAM: General:  alert, well-developed, and cooperative to examination.   Head:  normocephalic and atraumatic.   Eyes:  unable to exam  Mouth:  pharynx pink  and moist, no erythema, and no exudates.   Neck:  supple, full ROM, no thyromegaly, no JVD, and no carotid bruits.   Lungs:  normal respiratory effort, no accessory muscle use, CTAB (anteriorly) Neurologic:  A&OX3, cranial nerves II-XII intact, strength normal in all extremities, sensation intact to light touch, and gait normal.   Abd: covered by dressing due to surgery; no drainage through abdominal binder Skin:  turgor normal and no rashes.   Psych:  Oriented X3 Extremities: BLE non-edematous  LABS:  Sodium (NA)                              140               135-145          mEq/L  Potassium (K)                            3.3        l      3.5-5.1          mEq/L  Chloride                                 105               96-112           mEq/L  CO2  29                19-32            mEq/L  Glucose                                  159        h      70-99            mg/dL  BUN                                      10                6-23             mg/dL  Creatinine                               0.91              0.4-1.5          mg/dL  GFR, Est Non African American            >60               >60              mL/min  GFR, Est African American                >60               >60              mL/min    Oversized comment, see footnote  1  Calcium                                  8.6               8.4-10.5         mg/dL   WBC                                      10.5              4.0-10.5         K/uL  RBC                                      4.77              4.22-5.81        MIL/uL  Hemoglobin (HGB)                         12.9       l      13.0-17.0        g/dL  Hematocrit (HCT)                         40.3              39.0-52.0        %  MCV                                      84.5              78.0-100.0       fL  MCH -                                    27.0              26.0-34.0        pg  MCHC                                     32.0               30.0-36.0        g/dL  RDW                                      14.3              11.5-15.5        %  Platelet Count (PLT)                     224               150-400          K/uL  ASSESSMENT AND PLAN: (1) HTN --> Patient was placed on  Metoprolol 5 mg IV q 6 hrs. BP improved with SBP from 205/119 down to 156/90. Will consider to transition to oral beta blocker once is cleared by surgery. Consider to increase Clonidine TDS to 0.2 mg.  (2) SBO sp ex. laparotomy  and adhesion , lysis on 03/11/2010. Being followed by a surgery team. Will follow on their recommendations.  (3)HLD Surgery d/c'd home meds for the time being, until by mouth intake is resumed  (4)VTE PROPH: [place] Heparin 5000 untis Q8HRS + SCDs   ATTENDING PHYSICIAN: I discussed the case with the resident(s) as noted ad reviewed the resident's notes. I agree with the finding and plan - please refer to the attending physician note for more details.  Signature__________________________________________  Printed Name_______________________________________

## 2010-06-07 NOTE — Progress Notes (Signed)
Summary: med refill/gp  Phone Note Refill Request Message from:  Fax from Pharmacy on December 17, 2009 10:15 AM  Refills Requested: Medication #1:  HYDROCODONE-ACETAMINOPHEN 7.5-325 MG TABS One tablet every 4 hours as needed for pain   Last Refilled: 11/16/2009 Last appt. April 14; next appt. Aug 25.   Method Requested: Telephone to Pharmacy Initial call taken by: Chinita Pester RN,  December 17, 2009 10:15 AM  Follow-up for Phone Call        will refill at his visit Follow-up by: Julaine Fusi  DO,  December 20, 2009 11:41 AM

## 2010-06-07 NOTE — Procedures (Signed)
Summary: [facility].: Colonoscopy  [facility] Colonoscopy   Imported By: Florinda Marker 05/11/2009 16:16:06  _____________________________________________________________________  External Attachment:    Type:   Image     Comment:   External Document

## 2010-06-07 NOTE — Progress Notes (Signed)
Summary: phone/gg    Phone Note Call from Patient   Caller: Patient Summary of Call: Pt called with c/o pain on left side of neck for 3 days. Radiates  from back of ear to shoulder.  Area is tender to touch.   No known injury.  He has used alcohol to rub on area and used arthritis pain meds withour relief. Rate pain 10/10 when he tries  to sit up,  once he is up the pain is 1/10  -  mild and periotic.  Pt # [id number] Initial call taken by: Merrie Roof RN,  October 26, 2009 10:20 AM  Follow-up for Phone Call        I called pt but got answering machine.  Can you make first available appt to evaluate this pain?  Pt has natc for his other pains that he can use until appt.  Can use ice every hour.  If gets worse, to ER or [facility] Follow-up by: Blanch Media MD,  October 26, 2009 3:57 PM  Additional Follow-up for Phone Call Additional follow up Details #1::        I informed pt of above and made appointment for 6/28.   He is feeling better at this time. He will cancel if he continues to improve Additional Follow-up by: Merrie Roof RN,  October 26, 2009 4:34 PM

## 2010-06-07 NOTE — Assessment & Plan Note (Signed)
Summary: est-ck/fu/meds/cfb   Vital Signs:  Patient profile:   76 year old male Height:      71 inches (180.34 cm) Weight:      254 pounds (115.45 kg) BMI:     35.55 O2 Sat:      100 % Temp:     97.8 degrees F (36.56 degrees C) oral Pulse rate:   68 / minute BP sitting:   157 / 82  (right arm)  Vitals Entered By: Angelina Ok RN (August 19, 2009 9:40 AM) Is Patient Diabetic? No Pain Assessment Patient in pain? yes     Location: hips Intensity: 7 Type: aching Onset of pain  Constant Nutritional Status BMI of > 30 = obese  Have you ever been in a relationship where you felt threatened, hurt or afraid?No   Does patient need assistance? Functional Status Self care, Cook/clean, Shopping Ambulation Impaired:Risk for fall Comments Pt is blind.  Check on blood.  Was put on new pills.  Results of blood work.   Primary Care Provider:  Julaine Fusi  DO   History of Present Illness: Mr. Quant is in for a BP check up today. Otehr than hischronic pain he has no complaints. The MS contin I prescribed at his last visit did not help him- he thinks teh hydrocodone is best in addition to the Voltaren gel.  Depression History:      The patient denies a depressed mood most of the day and a diminished interest in his usual daily activities.  The patient denies significant weight loss, significant weight gain, insomnia, hypersomnia, psychomotor agitation, psychomotor retardation, fatigue (loss of energy), feelings of worthlessness (guilt), impaired concentration (indecisiveness), and recurrent thoughts of death or suicide.        The patient denies that he feels like life is not worth living, denies that he wishes that he were dead, and denies that he has thought about ending his life.         Preventive Screening-Counseling & Management  Alcohol-Tobacco     Smoking Status: quit     Year Quit: 20 YEARS AGO  Current Medications (verified): 1)  Hydrocodone-Acetaminophen 7.5-325 Mg Tabs  (Hydrocodone-Acetaminophen) .... One Tablet Every 4 Hours As Needed For Pain 2)  Gabapentin 600 Mg Tabs (Gabapentin) .... Take 1 Tablet By Mouth Three Times A Day or As Directed 3)  Voltaren 1 % Gel (Diclofenac Sodium) .... Apply Three Times A Day As Directed To Painful Joints 4)  Enalapril-Hydrochlorothiazide 10-25 Mg Tabs (Enalapril-Hydrochlorothiazide) .... Take 1 Tablet By Mouth Once A Day 5)  Aspirin 81 Mg Tbec (Aspirin) 6)  Coreg 12.5 Mg Tabs (Carvedilol) .... Take 1 Tablet By Mouth Two Times A Day 7)  Aspirin 81 Mg Tbec (Aspirin) 8)  Triamcinolone Acetonide 0.1 % Crea (Triamcinolone Acetonide) .... Apply To Affected Area Twice Daily 9)  Ms Contin 15 Mg Xr12h-Tab (Morphine Sulfate) .... Take 1 Tablet By Mouth Two Times A Day 10)  Crestor 5 Mg Tabs (Rosuvastatin Calcium) .... Take 1 Tablet By Mouth Once A Day At Bedtime 11)  Flector 1.3 % Ptch (Diclofenac Epolamine) .... Apply One Patch To Right Hip As Directed For Pain  Allergies (verified): No Known Drug Allergies  Review of Systems  The patient denies fever, weight loss, weight gain, chest pain, syncope, dyspnea on exertion, prolonged cough, headaches, abdominal pain, and severe indigestion/heartburn.    Physical Exam  General:  Well-developed,well-nourished,in no acute distress; Legally Blind wearing dark glasses. Lungs:  Normal respiratory effort, chest expands  symmetrically. Lungs are clear to auscultation, no crackles or wheezes. Heart:  Normal rate and regular rhythm. S1 and S2 normal without gallop, murmur, click, rub or other extra sounds. Msk:  pain on palpation of his low back. He has tight hamstrings and quads with a stiff gait and slightly hyperextended back. Skin:  Intact without suspicious lesions or rashes   Impression & Recommendations:  Problem # 1:  HYPERLIPIDEMIA (ICD-272.4) Will re-try a statin. Has caused muscle pain in the past. Reviewd lipid panel. Will start Crestor 5 and foloow-up in 6 months. He  tolerated statins in the past with no liver problems- so no need to re-check.   His updated medication list for this problem includes:    Crestor 5 Mg Tabs (Rosuvastatin calcium) .Marland Kitchen... Take 1 tablet by mouth once a day at bedtime  Problem # 2:  OSTEOARTHROSIS, GENERALIZED, MULTIPLE SITES (ICD-715.09) Will continue current pain medication regimen. Stop MSContin. Start Flector patch trial.  The following medications were removed from the medication list:    Ms Contin 15 Mg Xr12h-tab (Morphine sulfate) .Marland Kitchen... Take 1 tablet by mouth two times a day His updated medication list for this problem includes:    Hydrocodone-acetaminophen 7.5-325 Mg Tabs (Hydrocodone-acetaminophen) ..... One tablet every 4 hours as needed for pain    Aspirin 81 Mg Tbec (Aspirin)  Problem # 3:  HYPERTENSION, ESSENTIAL NOS (ICD-401.9)  BP trend is down. He is doing well on the Coreg that I started at his last visit. Can increase his Lisinopril at his next visit to try to get his BP to goal.  His updated medication list for this problem includes:    Enalapril-hydrochlorothiazide 10-25 Mg Tabs (Enalapril-hydrochlorothiazide) .Marland Kitchen... Take 1 tablet by mouth once a day    Coreg 12.5 Mg Tabs (Carvedilol) .Marland Kitchen... Take 1 tablet by mouth two times a day  BP today: 157/82 Prior BP: 158/87 (06/21/2009)  Prior 10 Yr Risk Heart Disease: 40 % (12/10/2008)  Labs Reviewed: K+: 3.7 (06/21/2009) Creat: : 1.25 (06/21/2009)   Chol: 190 (06/21/2009)   HDL: 38 (06/21/2009)   LDL: 129 (06/21/2009)   TG: 116 (06/21/2009)  Complete Medication List: 1)  Hydrocodone-acetaminophen 7.5-325 Mg Tabs (Hydrocodone-acetaminophen) .... One tablet every 4 hours as needed for pain 2)  Gabapentin 600 Mg Tabs (Gabapentin) .... Take 1 tablet by mouth three times a day or as directed 3)  Voltaren 1 % Gel (Diclofenac sodium) .... Apply three times a day as directed to painful joints 4)  Enalapril-hydrochlorothiazide 10-25 Mg Tabs  (Enalapril-hydrochlorothiazide) .... Take 1 tablet by mouth once a day 5)  Aspirin 81 Mg Tbec (Aspirin) 6)  Coreg 12.5 Mg Tabs (Carvedilol) .... Take 1 tablet by mouth two times a day 7)  Aspirin 81 Mg Tbec (Aspirin) 8)  Triamcinolone Acetonide 0.1 % Crea (Triamcinolone acetonide) .... Apply to affected area twice daily 9)  Crestor 5 Mg Tabs (Rosuvastatin calcium) .... Take 1 tablet by mouth once a day at bedtime 10)  Flector 1.3 % Ptch (Diclofenac epolamine) .... Apply one patch to right hip as directed for pain Prescriptions: FLECTOR 1.3 % PTCH (DICLOFENAC EPOLAMINE) Apply one patch to right hip as directed for pain  #5 x 0   Entered and Authorized by:   Julaine Fusi  DO   Signed by:   Julaine Fusi  DO on 08/19/2009   Method used:   [facility]ically to        [facility]  [address] [phone]* (retail)       [address]       [place], [place]  [id number]       Ph: [phone] or [phone]       Fax: [phone]   RxID:   [id number] CRESTOR 5 MG TABS (ROSUVASTATIN CALCIUM) Take 1 tablet by mouth once a day at bedtime  #31 x 3   Entered and Authorized by:   Julaine Fusi  DO   Signed by:   Julaine Fusi  DO on 08/19/2009   Method used:   [facility]        [facility]  [address] [phone]* (retail)       [address]       [place], [place]  [id number]       Ph: [phone] or [phone]       Fax: [phone]   RxID:   [id number]    Vital Signs:  Patient profile:   76 year old male Height:      71 inches (180.34 cm) Weight:      254 pounds (115.45 kg) BMI:     35.55 O2 Sat:      100 % Temp:     97.8 degrees F (36.56 degrees C) oral Pulse rate:   68 / minute BP sitting:   157 / 82  (right arm)  Vitals Entered By: Angelina Ok RN (August 19, 2009 9:40 AM)   Prevention & Chronic Care Immunizations   Influenza vaccine: Fluvax MCR  (02/05/2009)   Influenza vaccine deferral: Deferred  (08/19/2009)    Influenza vaccine due: 01/06/2010    Tetanus booster: 08/07/2006: 08/07/2006   Td booster deferral: Deferred  (08/19/2009)   Tetanus booster due: 08/06/2016    Pneumococcal vaccine: Pneumovax (Medicare)  (01/22/2008)    H. zoster vaccine: Not documented   H. zoster vaccine deferral: Not available  (08/19/2009)  Colorectal Screening   Hemoccult: Not documented   Hemoccult action/deferral: Deferred  (08/19/2009)    Colonoscopy: Not documented  Other Screening   PSA: Not documented   Smoking status: quit  (08/19/2009)  Lipids   Total Cholesterol: 190  (06/21/2009)   LDL: 129  (06/21/2009)   LDL Direct: Not documented   HDL: 38  (06/21/2009)   Triglycerides: 116  (06/21/2009)   Lipid panel due: 02/18/2010    SGOT (AST): 19  (06/21/2009)   SGPT (ALT): 12  (06/21/2009)   Alkaline phosphatase: 67  (06/21/2009)   Total bilirubin: 0.4  (06/21/2009)   Liver panel due: 02/18/2010  Hypertension   Last Blood Pressure: 157 / 82  (08/19/2009)   Serum creatinine: 1.25  (06/21/2009)   Serum potassium 3.7  (06/21/2009)    Hypertension flowsheet reviewed?: Yes   Progress toward BP goal: Improved  Self-Management Support :    Patient will work on the following items until the next clinic visit to reach self-care goals:     Medications and monitoring: take my medicines every day, bring all of my medications to every visit  (08/19/2009)     Eating: drink diet soda or water instead of juice or soda, eat more vegetables, eat foods that are low in salt, eat fruit for snacks and desserts, limit or avoid alcohol  (08/19/2009)     Activity: take a 30 minute walk every day  (08/19/2009)    Hypertension self-management support: BP self-monitoring log, Written self-care plan, Education handout, Resources for patients handout  (08/19/2009)   Hypertension  self-care plan printed.   Hypertension education handout printed    Lipid self-management support: Written self-care plan, Education  handout, Resources for patients handout  (08/19/2009)   Lipid self-care plan printed.   Lipid education handout printed      Resource handout printed.   Nursing Instructions: Patient had colonoscopy last year with Dr. Madilyn Fireman- we need those records.

## 2010-06-09 NOTE — Progress Notes (Signed)
Summary: REfill/gh  Phone Note Refill Request Message from:  Fax from Pharmacy on May 24, 2010 1:50 PM  Refills Requested: Medication #1:  PROTONIX 20 MG TBEC Take 1 tablet by mouth once a day   Last Refilled: 05/07/2010  Method Requested: Electronic Initial call taken by: Angelina Ok RN,  May 24, 2010 1:50 PM  Follow-up for Phone Call        Refill approved-nurse to complete Follow-up by: Julaine Fusi  DO,  May 24, 2010 4:06 PM    Prescriptions: PROTONIX 20 MG TBEC (PANTOPRAZOLE SODIUM) Take 1 tablet by mouth once a day  #30 x 6   Entered and Authorized by:   Julaine Fusi  DO   Signed by:   Julaine Fusi  DO on 05/24/2010   Method used:   [facility]  [address] [phone]* (retail)       [address]       [place], [place]  [id number]       Ph: [phone] or [phone]       Fax: [phone]   RxID:   [id number]

## 2010-06-13 ENCOUNTER — Other Ambulatory Visit: Payer: Self-pay | Admitting: Internal Medicine

## 2010-06-13 MED ORDER — GABAPENTIN (ONCE-DAILY) 600 MG PO TABS: 0.5000 | ORAL_TABLET | Freq: Three times a day (TID) | ORAL | Status: DC

## 2010-06-25 ENCOUNTER — Other Ambulatory Visit: Payer: Self-pay | Admitting: Internal Medicine

## 2010-07-08 ENCOUNTER — Other Ambulatory Visit: Payer: Self-pay | Admitting: *Deleted

## 2010-07-08 NOTE — Telephone Encounter (Signed)
Last refill 05/11/10

## 2010-07-14 ENCOUNTER — Encounter: Payer: Self-pay | Admitting: Internal Medicine

## 2010-07-14 ENCOUNTER — Ambulatory Visit (INDEPENDENT_AMBULATORY_CARE_PROVIDER_SITE_OTHER): Payer: PRIVATE HEALTH INSURANCE | Admitting: Internal Medicine

## 2010-07-14 DIAGNOSIS — M79609 Pain in unspecified limb: Secondary | ICD-10-CM

## 2010-07-14 DIAGNOSIS — M159 Polyosteoarthritis, unspecified: Secondary | ICD-10-CM

## 2010-07-14 DIAGNOSIS — H548 Legal blindness, as defined in USA: Secondary | ICD-10-CM

## 2010-07-14 DIAGNOSIS — K219 Gastro-esophageal reflux disease without esophagitis: Secondary | ICD-10-CM

## 2010-07-14 DIAGNOSIS — I10 Essential (primary) hypertension: Secondary | ICD-10-CM

## 2010-07-14 DIAGNOSIS — E785 Hyperlipidemia, unspecified: Secondary | ICD-10-CM

## 2010-07-14 DIAGNOSIS — M25559 Pain in unspecified hip: Secondary | ICD-10-CM

## 2010-07-14 LAB — CHOLESTEROL, TOTAL: Cholesterol: 151 mg/dL (ref 0–200)

## 2010-07-14 LAB — COMPREHENSIVE METABOLIC PANEL
Albumin: 4.1 g/dL (ref 3.5–5.2)
Alkaline Phosphatase: 73 U/L (ref 39–117)
CO2: 29 mEq/L (ref 19–32)
Calcium: 9.1 mg/dL (ref 8.4–10.5)
Chloride: 101 mEq/L (ref 96–112)
Glucose, Bld: 107 mg/dL — ABNORMAL HIGH (ref 70–99)
Potassium: 3.9 mEq/L (ref 3.5–5.3)
Sodium: 139 mEq/L (ref 135–145)
Total Protein: 7.3 g/dL (ref 6.0–8.3)

## 2010-07-14 MED ORDER — NAPROXEN 500 MG PO TABS: 500.0000 mg | ORAL_TABLET | Freq: Two times a day (BID) | ORAL | Status: DC

## 2010-07-14 MED ORDER — HYDROCODONE-ACETAMINOPHEN 7.5-325 MG PO TABS: 1.0000 | ORAL_TABLET | ORAL | Status: DC | PRN

## 2010-07-14 MED ORDER — ROSUVASTATIN CALCIUM 10 MG PO TABS: 10.0000 mg | ORAL_TABLET | Freq: Every day | ORAL | Status: DC

## 2010-07-14 MED ORDER — CARVEDILOL 12.5 MG PO TABS: 12.5000 mg | ORAL_TABLET | Freq: Two times a day (BID) | ORAL | Status: DC

## 2010-07-14 MED ORDER — PANTOPRAZOLE SODIUM 20 MG PO TBEC: 20.0000 mg | DELAYED_RELEASE_TABLET | Freq: Every day | ORAL | Status: DC

## 2010-07-14 MED ORDER — ASPIRIN 81 MG PO TBEC: 81.0000 mg | DELAYED_RELEASE_TABLET | Freq: Every day | ORAL | Status: DC

## 2010-07-14 MED ORDER — AMLODIPINE BESYLATE 5 MG PO TABS: 5.0000 mg | ORAL_TABLET | Freq: Every day | ORAL | Status: DC

## 2010-07-14 NOTE — Patient Instructions (Signed)
  YOU NEED TO USE BEDROOM OR INDOOR SLIPPERS WITH BETTER SUPPORT  GO TO EITHER [facility] OR [facility] TO GET "BIRKENSTOCK" TYPE SANDALS OR SLIPPERS FOR BETTER ARCH SUPPORT  OK TO TAKE PAIN PILLS, BUT ONLY WHEN YOU NEED THEM  MAKE SURE YOU ARE TAKING AMLODIPINE WITH YOUR OTHER PILLS, SINCE YOUR PRESSURE IS TOO HIGH  WE ARE CHECKING YOUR BLOOD TODAY FOR CHOL;ESTEROL, LIVER TESTS, AND KIDNEY TESTS.

## 2010-07-14 NOTE — Assessment & Plan Note (Signed)
Continue crestor Check lipids, cmet

## 2010-07-14 NOTE — Assessment & Plan Note (Signed)
Continue naproxen, hydrocodone Advised on importance of tight monitoring of vicodin and avoiding loss or theft of meds
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
## 2010-07-14 NOTE — Assessment & Plan Note (Signed)
Restart amlodipine. Enc to bring all meds to each visit as he has done Continue other medications

## 2010-07-15 LAB — LDL CHOLESTEROL, DIRECT: Direct LDL: 97 mg/dL

## 2010-07-15 NOTE — Telephone Encounter (Signed)
What med needs to be filled???

## 2010-07-18 NOTE — Telephone Encounter (Signed)
Med filled during office visit

## 2010-07-19 LAB — CBC
HCT: 39.5 % (ref 39.0–52.0)
MCH: 27 pg (ref 26.0–34.0)
MCH: 27.2 pg (ref 26.0–34.0)
MCH: 27.4 pg (ref 26.0–34.0)
MCHC: 32 g/dL (ref 30.0–36.0)
MCHC: 32.2 g/dL (ref 30.0–36.0)
MCV: 84.5 fL (ref 78.0–100.0)
MCV: 86.3 fL (ref 78.0–100.0)
Platelets: 224 10*3/uL (ref 150–400)
Platelets: 224 10*3/uL (ref 150–400)
Platelets: 252 10*3/uL (ref 150–400)
RBC: 3.8 MIL/uL — ABNORMAL LOW (ref 4.22–5.81)
RBC: 4.52 MIL/uL (ref 4.22–5.81)
RDW: 14.3 % (ref 11.5–15.5)
RDW: 14.7 % (ref 11.5–15.5)
RDW: 14.8 % (ref 11.5–15.5)
WBC: 10.5 10*3/uL (ref 4.0–10.5)
WBC: 7.6 10*3/uL (ref 4.0–10.5)

## 2010-07-19 LAB — URINE CULTURE
Colony Count: >=100000
Culture  Setup Time: 201111021031
Culture  Setup Time: 201111081705

## 2010-07-19 LAB — URINALYSIS, ROUTINE W REFLEX MICROSCOPIC
Ketones, ur: NEGATIVE mg/dL
Nitrite: NEGATIVE
Nitrite: NEGATIVE
Protein, ur: NEGATIVE mg/dL
Specific Gravity, Urine: 1.025 (ref 1.005–1.030)
pH: 5.5 (ref 5.0–8.0)

## 2010-07-19 LAB — BASIC METABOLIC PANEL
BUN: 10 mg/dL (ref 6–23)
BUN: 12 mg/dL (ref 6–23)
BUN: 13 mg/dL (ref 6–23)
BUN: 18 mg/dL (ref 6–23)
CO2: 28 mEq/L (ref 19–32)
CO2: 34 mEq/L — ABNORMAL HIGH (ref 19–32)
Calcium: 8.8 mg/dL (ref 8.4–10.5)
Calcium: 8.9 mg/dL (ref 8.4–10.5)
Chloride: 105 mEq/L (ref 96–112)
Chloride: 98 mEq/L (ref 96–112)
Creatinine, Ser: 0.91 mg/dL (ref 0.4–1.5)
Creatinine, Ser: 1.01 mg/dL (ref 0.4–1.5)
Creatinine, Ser: 1.01 mg/dL (ref 0.4–1.5)
Creatinine, Ser: 1.14 mg/dL (ref 0.4–1.5)
GFR calc Af Amer: >60 mL/min (ref >60)
GFR calc Af Amer: >60 mL/min (ref >60)
GFR calc non Af Amer: >60 mL/min (ref >60)

## 2010-07-19 LAB — DIFFERENTIAL
Lymphocytes Relative: 11 % — ABNORMAL LOW (ref 12–46)
Lymphs Abs: 1.1 10*3/uL (ref 0.7–4.0)
Neutrophils Relative %: 83 % — ABNORMAL HIGH (ref 43–77)

## 2010-07-19 LAB — URIC ACID: Uric Acid, Serum: 5.9 mg/dL (ref 4.0–7.8)

## 2010-07-19 LAB — COMPREHENSIVE METABOLIC PANEL
CO2: 31 mEq/L (ref 19–32)
Calcium: 9.4 mg/dL (ref 8.4–10.5)
Creatinine, Ser: 1.27 mg/dL (ref 0.4–1.5)
GFR calc Af Amer: >60 mL/min (ref >60)
GFR calc non Af Amer: 56 mL/min — ABNORMAL LOW (ref >60)
Glucose, Bld: 130 mg/dL — ABNORMAL HIGH (ref 70–99)
Total Protein: 8.2 g/dL (ref 6.0–8.3)

## 2010-07-19 LAB — LIPASE, BLOOD: Lipase: 23 U/L (ref 11–59)

## 2010-07-20 ENCOUNTER — Encounter: Payer: Self-pay | Admitting: *Deleted

## 2010-07-22 LAB — CBC
Hemoglobin: 12.1 g/dL — ABNORMAL LOW (ref 13.0–17.0)
MCHC: 31.8 g/dL (ref 30.0–36.0)
Platelets: 234 10*3/uL (ref 150–400)

## 2010-07-22 LAB — DIFFERENTIAL
Basophils Absolute: 0.1 10*3/uL (ref 0.0–0.1)
Basophils Relative: 1 % (ref 0–1)
Eosinophils Absolute: 0.3 10*3/uL (ref 0.0–0.7)
Monocytes Relative: 11 % (ref 3–12)
Neutro Abs: 2.3 10*3/uL (ref 1.7–7.7)
Neutrophils Relative %: 50 % (ref 43–77)

## 2010-07-22 LAB — POCT I-STAT, CHEM 8
Chloride: 105 mEq/L (ref 96–112)
HCT: 40 % (ref 39.0–52.0)
Hemoglobin: 13.6 g/dL (ref 13.0–17.0)
Potassium: 4.6 mEq/L (ref 3.5–5.1)
Sodium: 142 mEq/L (ref 135–145)

## 2010-08-22 ENCOUNTER — Other Ambulatory Visit: Payer: Self-pay | Admitting: *Deleted

## 2010-08-24 MED ORDER — GABAPENTIN (ONCE-DAILY) 600 MG PO TABS: 0.5000 | ORAL_TABLET | Freq: Three times a day (TID) | ORAL | Status: DC

## 2010-08-25 ENCOUNTER — Encounter: Payer: Self-pay | Admitting: Internal Medicine

## 2010-08-25 ENCOUNTER — Ambulatory Visit (INDEPENDENT_AMBULATORY_CARE_PROVIDER_SITE_OTHER): Payer: PRIVATE HEALTH INSURANCE | Admitting: Internal Medicine

## 2010-08-25 VITALS — BP 172/82 | HR 63 | Temp 97.9°F | Wt 252.0 lb

## 2010-08-25 DIAGNOSIS — I10 Essential (primary) hypertension: Secondary | ICD-10-CM

## 2010-08-25 DIAGNOSIS — J00 Acute nasopharyngitis [common cold]: Secondary | ICD-10-CM

## 2010-08-25 DIAGNOSIS — R221 Localized swelling, mass and lump, neck: Secondary | ICD-10-CM

## 2010-08-25 DIAGNOSIS — R220 Localized swelling, mass and lump, head: Secondary | ICD-10-CM

## 2010-08-25 MED ORDER — GUAIFENESIN-DM 100-10 MG/5ML PO SYRP: 5.0000 mL | ORAL_SOLUTION | Freq: Three times a day (TID) | ORAL | Status: AC | PRN

## 2010-08-25 MED ORDER — NITROGLYCERIN 0.4 MG SL SUBL: 0.4000 mg | SUBLINGUAL_TABLET | SUBLINGUAL | Status: DC | PRN

## 2010-08-25 MED ORDER — LORATADINE 10 MG PO TABS: 10.0000 mg | ORAL_TABLET | Freq: Every day | ORAL | Status: DC

## 2010-08-25 MED ORDER — CARVEDILOL 25 MG PO TABS: 25.0000 mg | ORAL_TABLET | Freq: Two times a day (BID) | ORAL | Status: DC

## 2010-08-25 NOTE — Assessment & Plan Note (Signed)
Increase the patient's carvedilol to 25 mg twice a day. Followup in one month for blood pressure checkup.

## 2010-08-25 NOTE — Progress Notes (Signed)
  Subjective:    Patient ID: Nicholas [name], male    DOB: 18-Sep-1934, 76 y.o.   MRN: [id number]  HPI Patient is a 76 year old man with past medical history of blindness since he was 76 years old. This is my first office visit with him.  Patient comes in today for chief complaint of congestion. Patient states that he's been having cough with sputum since last 3 weeks, sputum is yellowish brown in color, no chest pain, shortness of breath, fevers or chills noted. Patient states that he usually gets an allergic cough at the start of pollen season.  Patient complaints of a mass in his neck which has been growing for last 4-5 weeks. Maisie Fus is slowly progressive,about 2 into 2 cm in size now, there is no difficulty in swallowing, no difficulty in speech, no pain or tenderness. Patient does not complain of recent weight loss.   Review of Systems  Constitutional: Negative for fever, activity change and appetite change.  HENT: Positive for congestion. Negative for sore throat.        [Neck swelling Respiratory: Positive for cough. Negative for shortness of breath.        [Associated with sputum Cardiovascular: Negative for chest pain and leg swelling.  Gastrointestinal: Negative for nausea, abdominal pain, diarrhea, constipation and abdominal distention.  Genitourinary: Negative for frequency, hematuria and difficulty urinating.  Neurological: Negative for dizziness and headaches.  Psychiatric/Behavioral: Negative for suicidal ideas and behavioral problems.       Objective:   Physical Exam  Constitutional: He is oriented to person, place, and time. He appears well-developed and well-nourished.  HENT:  Head: Normocephalic and atraumatic.         Told to 2 cm subcutaneous swelling, nonfluctuant, moving with swallowing and sticking out her tongue, nontender.  The arrow points towards the swelling which is present midline just above cricoid cartilage.  Eyes: Conjunctivae and EOM are normal.  Pupils are equal, round, and reactive to light. No scleral icterus.  Neck: Normal range of motion. Neck supple. No JVD present. No thyromegaly present.  Cardiovascular: Normal rate, regular rhythm, normal heart sounds and intact distal pulses.  Exam reveals no gallop and no friction rub.   No murmur heard. Pulmonary/Chest: Effort normal and breath sounds normal. No respiratory distress. He has no wheezes. He has no rales.  Abdominal: Soft. Bowel sounds are normal. He exhibits no distension and no mass. There is no tenderness. There is no rebound and no guarding.  Musculoskeletal: Normal range of motion. He exhibits no edema and no tenderness.  Lymphadenopathy:    He has no cervical adenopathy.  Neurological: He is alert and oriented to person, place, and time.  Psychiatric: He has a normal mood and affect. His behavior is normal.          Assessment & Plan:

## 2010-08-25 NOTE — Patient Instructions (Signed)
Upper Respiratory Infection (URI), Adult You have an upper respiratory infection (URI). This is also known as the common cold. The upper respiratory tract includes the nose, sinuses, throat, trachea, and bronchi (airways leading to the lungs).  CAUSE Many different viruses can infect the tissues lining the upper respiratory tract. The tissues become irritated and inflamed and often become very moist. A cold is contagious. You can easily spread the virus to others by oral contact. This includes kissing, sharing a glass, and coughing or sneezing. It can also be spread by touching your mouth or nose and then touching a surface which is then touched by another person. Symptoms typically develop one to three days after you come in contact with a cold virus. SYMPTOMS Symptoms vary from person to person. They may include runny nose, sneezing, nasal congestion, sinus irritation, sore throat, laryngitis, or cough. Fatigue, muscle aches, headache, and low grade fever may also occur. DIAGNOSIS You may diagnose your own upper respiratory infection based on familiar symptoms, since most people get a cold 2-3 times a year. Your caregiver can confirm this based on your examination. Most importantly, your caregiver can check that your symptoms are not due to another disease such as strep throat, sinusitis, pneumonia, asthma, epiglottis, or others. Blood tests, throat tests, and x-rays are not necessary to diagnose an upper respiratory infection.  PREVENTION The best way to protect against getting a cold is to practice good hygiene. Avoid oral or hand contact with people with cold symptoms. Wash hands often if contact occurs. There is no clear evidence that vitamin C, vitamin E, Echinacea, or exercise reduces the chance of developing an upper respiratory infection. PROGNOSIS An upper respiratory infection is benign. Most people improve within a week, but symptoms can last two weeks. A residual cough may last a week  longer. Complications can develop. RISKS AND COMPLICATIONS You may be at risk for a more severe case of the common cold if you smoke cigarettes, have chronic heart or lung disease, or if you have a weakened immune system. Bacterial sinusitis, middle ear infections, and bacterial pneumonia can complicate the common cold. The common cold can worsen asthma and COPD (chronic obstructive pulmonary disease). Sometimes these complications can be life threatening. TREATMENT Treatment is directed at relieving symptoms. There is no cure. Antibiotics are not effective. In fact, improper use of antibiotics can lead to the development of drug resistant bacteria. Non-medical treatment is safe and may be helpful:  Increased fluid intake.   Inhale heated mist or steam (vaporizer or shower).   Sip chicken soup.   Get plenty of rest.   Use gargles or lozenges for comfort.  Zinc gel and zinc lozenges, taken in the first 24 hours of the common cold, can shorten the duration and lessen the severity of symptoms. Pain medications may help fever, muscle aches, and throat pain. A variety of non-prescription medications are available to treat congestion and runny nose. Your caregiver can make recommendations and may suggest nasal or lung inhalers for other symptoms. Once again, antibiotics are not effective for upper respiratory infections.  HOME CARE INSTRUCTIONS  Only take over-the-counter or prescription medicines for pain, discomfort, or fever as directed by your caregiver.   Use a warm mist humidifier or inhale steam from a shower to increase air moisture. This may keep secretions moist and make it easier to breathe.   Drink plenty of clear liquids. You are drinking enough fluids if the urine remains a light yellow color rather than a  dark color.   Rest as needed.   Return to work when your temperature has returned to normal or as your caregiver advises. You may need to stay home longer to avoid infecting  others. You can also use a face mask and careful hand washing to prevent spread of virus.  SEEK MEDICAL CARE IF:  After the first few days, you feel you are getting worse rather than better.   You need your caregiver's advice about medications to control symptoms.   You develop symptoms that you think suggest complications such as pneumonia, sinusitis, or strep throat.  SEEK IMMEDIATE MEDICAL CARE IF:  You develop an oral temperature above 101F or if the fever lasts more than 2 days.   You develop severe or persistent headache, ear pain, sinus pain, or chest pain.   You develop a prolonged cough, cough up blood, have a change in your usual mucus (if you have chronic lung disease), or develop wheezing.   You develop sore muscles, stiff neck, or severe headache not controlled with medications.  Document Released: 10/18/2000 Document Re-Released: 02/01/2008 [facility] Patient Information 2011 [place], [place].  Steam inhalation 3-4 times day. Avoid pollen with face masks and using handkerchief on your face or just avoiding getting out in early mornings or late evenings. See your general surgeon for evaluation of the neck mass.

## 2010-08-26 ENCOUNTER — Other Ambulatory Visit: Payer: Self-pay | Admitting: Internal Medicine

## 2010-09-08 ENCOUNTER — Other Ambulatory Visit: Payer: Self-pay | Admitting: Orthopaedic Surgery

## 2010-09-08 DIAGNOSIS — R221 Localized swelling, mass and lump, neck: Secondary | ICD-10-CM

## 2010-09-13 ENCOUNTER — Other Ambulatory Visit: Payer: PRIVATE HEALTH INSURANCE

## 2010-09-23 NOTE — Procedures (Signed)
NAME:  JADAN, HINOJOS NO.:  [id number]   MEDICAL RECORD NO.:  [id number]          PATIENT TYPE:  OUT   LOCATION:  SLEEP CENTER                 FACILITY:  [facility]   PHYSICIAN:  Clinton D. Maple Hudson, M.D. DATE OF BIRTH:  May 31, 1934   DATE OF ADMISSION:  12/16/2003  DATE OF DISCHARGE:  12/16/2003                              NOCTURNAL POLYSOMNOGRAM   REFERRING PHYSICIAN:  Dr. Mont Dutton, [facility].   INDICATION FOR STUDY:  Hypersomnia with sleep apnea.   EPWORTH SLEEPINESS SCORE:  19/24.   NECK SIZE:  Eighteen inches.   BODY MASS INDEX:  BMI 37, weight 266 pounds.   MEDICATIONS:  Medications include labetalol, Sulindac, Lasix, Lipitor.   SLEEP ARCHITECTURE:  Short total sleep time 133 minutes, barely adequate for  evaluation, with sleep efficiency 37%.  Stage I 42%, stage II 58%; stages  III and IV, and REM were absent.  Latency to sleep onset 6.5 minutes.  Awake  after sleep onset 211 minutes, arousal index 75 per hour.  Technician  commented that the patient took no medications before the sleep study.  Bathroom x3.  He was noted to be very restless with difficulty maintaining  sleep.  He asked to stop the study at 3:35 a.m. and to go home, stating he  was no longer sleepy.  He also complained of leg pain due to arthritis.   RESPIRATORY DATA:  NPSG protocol was requested.  Severe obstructive sleep  apnea/hypopnea, RDI 52 per hour.  This included 116 obstructive hypopneas,  151 obstructive apneas during the limited sleep time.  The events were not  positional.   OXYGEN DATA:  Moderate to occasionally loud snoring with moderate oxygen  desaturation to 81% through the study.  Baseline room air saturation 96%  while awake.   CARDIAC DATA:  Sinus rhythm with occasional PAC and PVC.   MOVEMENT/PARASOMNIA:  Occasional leg jerks with arousal, probably  insignificant.   IMPRESSION/RECOMMENDATION:  Severe obstructive sleep apnea/hypopnea  syndrome, respiratory disturbance index 52 per hour with mild oxygen  desaturation.  Short total sleep time as noted with comment about  leg pain and observation that patient is blind, suggesting there may be  difficulty with circadian rhythm.  Consider return with sleep medication for  continuous positive airway pressure titration.                                   ______________________________                                Rennis Chris Maple Hudson, M.D.                                Diplomate, American Board of Sleep Medicine    CDY/MEDQ  D:  12/20/2003 13:28:13  T:  12/21/2003 11:35:07  Job:  [id number]

## 2010-09-23 NOTE — Op Note (Signed)
NAME:  Nicholas [name], Nicholas [name] NO.:  [id number]   MEDICAL RECORD NO.:  [id number]          PATIENT TYPE:  OIB   LOCATION:  6738                         FACILITY:  [facility]   PHYSICIAN:  Mark C. Ophelia Charter, M.D.    DATE OF BIRTH:  November 24, 1934   DATE OF PROCEDURE:  10/18/2005  DATE OF DISCHARGE:                                 OPERATIVE REPORT   PREOPERATIVE DIAGNOSIS:  Left index finger laceration with lacerated  profundus and subligamentous tendon, zone 2.   PROCEDURE:  Repair of left index profundus and subligamentous tendon.   SURGEON:  Mark C. Ophelia Charter, M.D.   ANESTHESIA:  GOT.   TOURNIQUET TIME:  49 minutes.   BRIEF HISTORY:  A 76 year old male was loading junk in the trunk of his car  and was unsure exactly what he hit, but he lacerated index finger with a  comminuted jagged type laceration just proximal to the little finger crease  with laceration of the profundus tendon and one slip of the subligamentous  with partial to the other slip.   DESCRIPTION OF PROCEDURE:  After Betadine scrub and prep, the patient had  Kefzol in Urgent Care and standard prepping and draping was performed.  Flat  hand was used and exploration revealed the above tendon injuries.  He had  sensation into to the finger on preoperative testing.  Profundus was  migrated proximally and an incision was made just proximal to the A1 pulley.  Sheath was split, A1 pulley was divided, and profundus tendon was  identified.  A Tajima suture was placed and using the tendon passer from  distal to proximal, tendon was then passed through the sheath through slip  of muscle tendon in normal position, and then sutured back to the profundus  tendon.  After passing it through, the subligamentous slip was repaired with  4-0 Ethibond suture.  4-0 Ethibond was used in the profundus as well, the  partial slip in the ulnar slip of the __________ was repaired as well as the  knot buried.  Profundus was then tied down with  the knots buried.  Repair  was smooth and with flexion and extension, there was no catching.  Looking  proximally, proximal A1 pulley with good gliding of the tendons.  Wound had  been copiously irrigated and debrided of some debris.  The patient looked  like he had grease on his hands from some of the work he had been doing and  this was contaminated in the subcutaneous tissue was debrided.  Stellate  type skin lacerations were repaired with multiple flaps were repaired using  a combination of simple 4-0 nylon sutures.  Proximal incision was also  repaired with 4-0 nylon.  Xeroform was applied.  Tourniquet was deflated.  Good capillary refill to the fingertips.  After irrigation of the hand,  dressing was applied with Xeroform and 4x4's, __________, dorsal splint with  the wrist flexed, MCP's flexed, PIP straightened.  Fluffs were placed on the  hand and the patient was transferred to the recovery room in stable  condition.      Mark C. Ophelia Charter, M.D.  MCY/MEDQ  D:  10/18/2005  T:  10/19/2005  Job:  [id number]

## 2010-09-23 NOTE — Op Note (Signed)
NAME:  Nicholas [name], Nicholas [name] NO.:  [id number]   MEDICAL RECORD NO.:  [id number]                   PATIENT TYPE:  AMB   LOCATION:  ENDO                                 FACILITY:  [facility]   PHYSICIAN:  John C. Madilyn Fireman, M.D.                 DATE OF BIRTH:  06-09-1934   DATE OF PROCEDURE:  12/29/2003  DATE OF DISCHARGE:                                 OPERATIVE REPORT   PROCEDURE:  Colonoscopy with polypectomy.   INDICATIONS FOR PROCEDURE:  Anemia in a 76 year old patient with no prior  colon screening.   DESCRIPTION OF PROCEDURE:  The patient was placed in the left lateral  decubitus position and placed on the pulse monitor with continuous low-flow  oxygen delivered by nasal cannula.  He was sedated with 62.5 mcg IV fentanyl  and 5 mg IV Versed.  The Olympus video colonoscope was inserted into the  rectum and advanced to the cecum, confirmed by transillumination at  McBurney's point and visualization of the ileocecal valve and appendiceal  orifice.  The prep was excellent.  The cecum, ascending, transverse, and  descending colon all appeared normal with no masses, polyps, diverticula, or  other mucosal abnormalities.  Within the sigmoid colon, there was a 1.2 cm  pedunculated polyp which was removed by snare.  The remainder of the sigmoid  and rectum appeared normal, and retroflexed view of the anus revealed no  obvious internal hemorrhoids.  The scope was then withdrawn, and the patient  returned to the recovery room in stable condition.  He tolerated the  procedure well, and there were no immediate complications.   IMPRESSION:  Sigmoid colon polyp.   PLAN:  Await histology to determine method and interval for future colon  screening.                                               John C. Madilyn Fireman, M.D.    JCH/MEDQ  D:  12/29/2003  T:  12/29/2003  Job:  [id number]   cc:   Mont Dutton, MD  [facility]Internal Med. Resident  [place]  [place] [address]  Fax:  [phone]

## 2010-09-23 NOTE — Op Note (Signed)
NAME:  Nicholas [name], Nicholas [name] NO.:  [id number]   MEDICAL RECORD NO.:  [id number]          PATIENT TYPE:  AMB   LOCATION:  ENDO                         FACILITY:  [facility]   PHYSICIAN:  John C. Madilyn Fireman, M.D.    DATE OF BIRTH:  August 27, 1934   DATE OF PROCEDURE:  09/26/2004  DATE OF DISCHARGE:                                 OPERATIVE REPORT   PROCEDURE PERFORMED:  Esophagogastroduodenoscopy with biopsy.   INDICATIONS FOR PROCEDURE:  Anemia with a negative colonoscopy and long  history of nonsteroidal anti-inflammatory drug use.   PROCEDURE:  The patient was placed in the left lateral decubitus position  and placed on the pulse monitor with continuous low-flow oxygen delivered by  nasal cannula,  He was sedated with 75 mcg IV fentanyl and 7 milligrams IV  Versed. Olympus video endoscope was advanced under direct vision into the  oropharynx and esophagus.  The esophagus was straight and of normal caliber  with the squamocolumnar line at 38 cm.  There is no visible hiatal hernia  ring stricture or other abnormality of the GE junction. Stomach was entered  and a small amount of liquid secretions were suctioned from the fundus.  Retroflexed view of cardia was unremarkable. The fundus and body appeared  normal. The antrum showed a small, clean based 4 mm ulcer with adjacent  mucosa biopsied for CLO-test. The pylorus was nondeformed and easily allowed  passage of the endoscope tip into the duodenum.  Both the bulb and second  portion were well inspected appeared to be within normal limits. The scope  was then withdrawn and the patient returned to the recovery room in stable  condition. He tolerated the procedure well and there were no immediate  complications.   IMPRESSION:  Small gastric ulcer.   PLAN:  Await CLO-test and treat for eradication if Helicobacter positive,  otherwise treat with proton pump inhibitor.  He is already on Protonix 40  milligrams a day.      JCH/MEDQ   D:  09/26/2004  T:  09/26/2004  Job:  [id number]   cc:   Mont Dutton, MD  [facility]Internal Med. Resident  [place]  [place] [address]  Fax: [phone]

## 2010-09-26 ENCOUNTER — Other Ambulatory Visit: Payer: Self-pay | Admitting: *Deleted

## 2010-09-26 DIAGNOSIS — I10 Essential (primary) hypertension: Secondary | ICD-10-CM

## 2010-09-28 MED ORDER — AMLODIPINE BESYLATE 5 MG PO TABS: 5.0000 mg | ORAL_TABLET | Freq: Every day | ORAL | Status: DC

## 2010-10-18 ENCOUNTER — Encounter (INDEPENDENT_AMBULATORY_CARE_PROVIDER_SITE_OTHER): Payer: Self-pay | Admitting: Surgery

## 2010-11-14 ENCOUNTER — Other Ambulatory Visit: Payer: Self-pay | Admitting: Internal Medicine

## 2010-11-14 NOTE — Telephone Encounter (Signed)
Seen 4/12 and requested 1 month F/U bc made HTN med change. No appt. Will ask front desk to make appt ASAP.

## 2010-12-03 ENCOUNTER — Other Ambulatory Visit: Payer: Self-pay | Admitting: Internal Medicine

## 2010-12-07 ENCOUNTER — Encounter: Payer: PRIVATE HEALTH INSURANCE | Admitting: Internal Medicine

## 2010-12-07 ENCOUNTER — Ambulatory Visit (INDEPENDENT_AMBULATORY_CARE_PROVIDER_SITE_OTHER): Payer: PRIVATE HEALTH INSURANCE | Admitting: Internal Medicine

## 2010-12-07 ENCOUNTER — Encounter: Payer: Self-pay | Admitting: Internal Medicine

## 2010-12-07 DIAGNOSIS — E785 Hyperlipidemia, unspecified: Secondary | ICD-10-CM

## 2010-12-07 DIAGNOSIS — M1990 Unspecified osteoarthritis, unspecified site: Secondary | ICD-10-CM

## 2010-12-07 DIAGNOSIS — K219 Gastro-esophageal reflux disease without esophagitis: Secondary | ICD-10-CM

## 2010-12-07 DIAGNOSIS — M129 Arthropathy, unspecified: Secondary | ICD-10-CM

## 2010-12-07 DIAGNOSIS — L908 Other atrophic disorders of skin: Secondary | ICD-10-CM

## 2010-12-07 DIAGNOSIS — I10 Essential (primary) hypertension: Secondary | ICD-10-CM

## 2010-12-07 DIAGNOSIS — M25559 Pain in unspecified hip: Secondary | ICD-10-CM

## 2010-12-07 MED ORDER — PANTOPRAZOLE SODIUM 20 MG PO TBEC: 20.0000 mg | DELAYED_RELEASE_TABLET | Freq: Every day | ORAL | Status: DC

## 2010-12-07 MED ORDER — GABAPENTIN 600 MG PO TABS: 600.0000 mg | ORAL_TABLET | Freq: Every day | ORAL | Status: DC

## 2010-12-07 MED ORDER — AMLODIPINE BESYLATE 5 MG PO TABS: 5.0000 mg | ORAL_TABLET | Freq: Every day | ORAL | Status: DC

## 2010-12-07 MED ORDER — TRIAMCINOLONE ACETONIDE 0.1 % EX OINT: TOPICAL_OINTMENT | Freq: Two times a day (BID) | CUTANEOUS | Status: DC

## 2010-12-07 MED ORDER — ASPIRIN 81 MG PO TBEC: 81.0000 mg | DELAYED_RELEASE_TABLET | Freq: Every day | ORAL | Status: DC

## 2010-12-07 MED ORDER — DICLOFENAC SODIUM 1 % TD GEL: 1.0000 "application " | Freq: Four times a day (QID) | TRANSDERMAL | Status: DC

## 2010-12-07 MED ORDER — ENALAPRIL-HYDROCHLOROTHIAZIDE 10-25 MG PO TABS: 2.0000 | ORAL_TABLET | ORAL | Status: DC

## 2010-12-07 MED ORDER — ROSUVASTATIN CALCIUM 10 MG PO TABS: 10.0000 mg | ORAL_TABLET | Freq: Every day | ORAL | Status: DC

## 2010-12-07 NOTE — Assessment & Plan Note (Signed)
Stable. His Protonix was refilled.

## 2010-12-07 NOTE — Assessment & Plan Note (Signed)
  Blood pressure very well controlled. Will continue the current regimen. Lab Results  Component Value Date   NA 139 07/14/2010   K 3.9 07/14/2010   CL 101 07/14/2010   CO2 29 07/14/2010   BUN 13 07/14/2010   CREATININE 1.04 07/14/2010   CREATININE 1.01 03/16/2010    BP Readings from Last 3 Encounters:  12/07/10 140/71  08/25/10 172/82  07/14/10 176/86

## 2010-12-07 NOTE — Patient Instructions (Signed)
Please take your medicines as prescribed. Please schedule a follow up appointment in 2 months

## 2010-12-07 NOTE — Assessment & Plan Note (Signed)
Likely from osteoarthritis he was also complaining of some shoulder pain. He was offered physical therapy but he refused. He was given Voltaren gel for topical application.

## 2010-12-07 NOTE — Assessment & Plan Note (Signed)
His last lipid panel was reviewed . Will continue him on the current dose. His medication was refilled.

## 2010-12-07 NOTE — Progress Notes (Signed)
  Subjective:    Patient ID: Nicholas [name], male    DOB: 20-Dec-1934, 76 y.o.   MRN: [id number]  HPI: 76 year old man with past medical history significant for hyperlipidemia, blindness, hypertension comes to the clinic for a followup visit.   He states that his arthritis is bothering him a lot. He was requesting a refill for the voltaren gel. He is also requesting for some skin ointment for occasional skin breakdowns and itching.     Review of Systems  Constitutional: Negative for fever, chills, activity change, appetite change and fatigue.  HENT: Negative for hearing loss, ear pain, congestion, facial swelling, rhinorrhea, sneezing, postnasal drip and tinnitus.   Eyes: Negative for discharge and itching.  Respiratory: Negative for apnea, cough, choking, chest tightness, shortness of breath and stridor.   Cardiovascular: Negative for chest pain, palpitations and leg swelling.  Gastrointestinal: Negative for abdominal distention.  Genitourinary: Negative for dysuria, hematuria, flank pain and difficulty urinating.  Musculoskeletal: Negative for arthralgias.  Neurological: Negative for dizziness, light-headedness, numbness and headaches.  Hematological: Negative for adenopathy.       Objective:   Physical Exam  Constitutional: He is oriented to person, place, and time. He appears well-developed and well-nourished. No distress.  HENT:  Head: Normocephalic and atraumatic.  Eyes: Conjunctivae and EOM are normal. Pupils are equal, round, and reactive to light. Right eye exhibits no discharge. Left eye exhibits no discharge. No scleral icterus.  Neck: Normal range of motion. Neck supple. No JVD present. No tracheal deviation present. No thyromegaly present.  Cardiovascular: Normal rate, regular rhythm and intact distal pulses.  Exam reveals no gallop and no friction rub.   No murmur heard. Pulmonary/Chest: No stridor. No respiratory distress. He has no wheezes. He has no rales. He exhibits  no tenderness.  Abdominal: Soft. Bowel sounds are normal. He exhibits no distension and no mass. There is no tenderness. There is no rebound and no guarding.  Musculoskeletal: Normal range of motion. He exhibits no edema and no tenderness.  Lymphadenopathy:    He has no cervical adenopathy.  Neurological: He is alert and oriented to person, place, and time. He has normal reflexes. He displays normal reflexes. No cranial nerve deficit. He exhibits normal muscle tone. Coordination normal.  Skin: He is not diaphoretic.          Assessment & Plan:

## 2011-01-10 ENCOUNTER — Other Ambulatory Visit: Payer: Self-pay | Admitting: *Deleted

## 2011-01-10 DIAGNOSIS — M159 Polyosteoarthritis, unspecified: Secondary | ICD-10-CM

## 2011-01-10 MED ORDER — HYDROCODONE-ACETAMINOPHEN 7.5-325 MG PO TABS: 1.0000 | ORAL_TABLET | ORAL | Status: DC | PRN

## 2011-01-10 NOTE — Telephone Encounter (Signed)
Last refill 8/6

## 2011-01-11 NOTE — Telephone Encounter (Signed)
Rx called in 

## 2011-01-25 ENCOUNTER — Other Ambulatory Visit: Payer: Self-pay | Admitting: Internal Medicine

## 2011-01-25 DIAGNOSIS — K219 Gastro-esophageal reflux disease without esophagitis: Secondary | ICD-10-CM

## 2011-02-06 ENCOUNTER — Other Ambulatory Visit: Payer: Self-pay | Admitting: *Deleted

## 2011-02-07 MED ORDER — GABAPENTIN (ONCE-DAILY) 600 MG PO TABS: 0.5000 | ORAL_TABLET | Freq: Three times a day (TID) | ORAL | Status: DC

## 2011-02-09 ENCOUNTER — Ambulatory Visit: Payer: PRIVATE HEALTH INSURANCE | Admitting: Internal Medicine

## 2011-02-17 ENCOUNTER — Ambulatory Visit (INDEPENDENT_AMBULATORY_CARE_PROVIDER_SITE_OTHER): Payer: PRIVATE HEALTH INSURANCE | Admitting: Internal Medicine

## 2011-02-17 ENCOUNTER — Encounter: Payer: Self-pay | Admitting: Internal Medicine

## 2011-02-17 DIAGNOSIS — M5430 Sciatica, unspecified side: Secondary | ICD-10-CM

## 2011-02-17 DIAGNOSIS — M1990 Unspecified osteoarthritis, unspecified site: Secondary | ICD-10-CM

## 2011-02-17 DIAGNOSIS — M48061 Spinal stenosis, lumbar region without neurogenic claudication: Secondary | ICD-10-CM

## 2011-02-17 DIAGNOSIS — I10 Essential (primary) hypertension: Secondary | ICD-10-CM

## 2011-02-17 DIAGNOSIS — Z23 Encounter for immunization: Secondary | ICD-10-CM

## 2011-02-17 DIAGNOSIS — M159 Polyosteoarthritis, unspecified: Secondary | ICD-10-CM

## 2011-02-17 HISTORY — DX: Unspecified osteoarthritis, unspecified site: M19.90

## 2011-02-17 MED ORDER — HYDROCODONE-ACETAMINOPHEN 7.5-325 MG PO TABS: 1.0000 | ORAL_TABLET | ORAL | Status: DC | PRN

## 2011-02-17 NOTE — Progress Notes (Signed)
  Subjective:    Patient ID: Kevan Ny, male    DOB: August 16, 1934, 76 y.o.   MRN: [id number]  HPI Mr. Kaiyan Luczak is a pleasant 76 year old man with past with history of HTN, lumbar spinal stenosis, osteoarthritis comes the clinic with worsening low back and right lower extremity pain for past 4-6 weeks. He is also blind and accompanied by his friend. He says that his pain starts in the right ankle and goes all over his lower extremity up to the right flank. He has lumbar spine stenosis and sciatica. Compression of L4,5 and S1 nerve roots per CT scan lumbar spine in 2007. Although his symptoms today are more unilateral and bilateral. He has been tried on multiple medications in past. He is already Neurontin and Vicodin. He gets 45 tablets of Vicodin a month but his pain is not controlled with that now. He says that he is worsening of pain during winter season. He wants to increase the dose of his Vicodin. He denies any fever, chills, nausea, vomiting, abdominal pain, diarrhea, loss of bowel or bladder control, recent weakness or new sensation changes in lower extremities.      Review of Systems    as per history of present illness, all other systems reviewed and negative Objective:   Physical Exam  Vitals: Reviewed General: NAD HEENT: PERRL, EOMI, no scleral icterus Cardiac: RRR, no rubs, murmurs or gallops Pulm: clear to auscultation bilaterally, moving normal volumes of air Abd: soft, nontender, nondistended, BS present Ext: Disfigured DIP joints in bilateral hands-secondary to chronic OA. Minimal decrease range of motion of right ankle due to pain, but no redness, warmth, swelling. Neuro: alert and oriented X3, patient is blind, strength and sensation to light touch equal in bilateral upper and lower extremities.       Assessment & Plan:

## 2011-02-17 NOTE — Assessment & Plan Note (Addendum)
Lumbar spinal stenosis for long time. On Vicodin and Neurontin. Pain getting worse. Last CT scan of lumbar spine was in 2007. Considering this I will increase the dose of Vicodin to one tablet twice a day when necessary, and started on pain contract and will get UDS today for Vicodin 7.5/325 60 tablets every month. This was decided after discussing this with Dr. Rogelia Boga who also saw the patient. We decided to refer him to physical therapy but he denied and doesn't want to move around more, as his osteoarthritis getting worse with physical therapy. He was seen at sports medicine clinic in past and so if his pain continues to worsen we'll consider referring him back before or after lumbar imaging studies.

## 2011-02-17 NOTE — Assessment & Plan Note (Signed)
Lab Results  Component Value Date   NA 139 07/14/2010   K 3.9 07/14/2010   CL 101 07/14/2010   CO2 29 07/14/2010   BUN 13 07/14/2010   CREATININE 1.04 07/14/2010   CREATININE 1.01 03/16/2010    BP Readings from Last 3 Encounters:  02/17/11 167/80  12/07/10 140/71  08/25/10 172/82    Assessment: Hypertension control:  moderately elevated  Progress toward goals:  deteriorated Barriers to meeting goals:  Recent worsening of pain  Plan: Hypertension treatment:  continue current medications. Recheck during next visit and make appropriate changes as needed.

## 2011-02-17 NOTE — Patient Instructions (Addendum)
Please make followup appointment in 2-3 months. I will change prescription of Vicodin to 60 tablets every month. I will also refer you to physical therapy today. Please take all her medications regularly.  If your pain doesn't get better with medication and physical therapy, call us and make an early appointment

## 2011-02-17 NOTE — Assessment & Plan Note (Signed)
Cannot tolerate NSAIDS. On Neurontin and Vicodin.

## 2011-02-18 LAB — DRUG SCREEN, URINE
Barbiturate Quant, Ur: NEGATIVE
Benzodiazepines.: NEGATIVE
Creatinine,U: 229.3 mg/dL
Marijuana Metabolite: NEGATIVE
Phencyclidine (PCP): NEGATIVE
Propoxyphene: NEGATIVE

## 2011-03-27 ENCOUNTER — Other Ambulatory Visit: Payer: Self-pay | Admitting: Internal Medicine

## 2011-03-29 ENCOUNTER — Ambulatory Visit (INDEPENDENT_AMBULATORY_CARE_PROVIDER_SITE_OTHER): Payer: PRIVATE HEALTH INSURANCE | Admitting: Internal Medicine

## 2011-03-29 ENCOUNTER — Encounter: Payer: Self-pay | Admitting: Internal Medicine

## 2011-03-29 VITALS — BP 141/75 | HR 63 | Temp 97.3°F | Ht 71.0 in | Wt 247.1 lb

## 2011-03-29 DIAGNOSIS — J988 Other specified respiratory disorders: Secondary | ICD-10-CM

## 2011-03-29 DIAGNOSIS — J189 Pneumonia, unspecified organism: Secondary | ICD-10-CM | POA: Insufficient documentation

## 2011-03-29 DIAGNOSIS — J22 Unspecified acute lower respiratory infection: Secondary | ICD-10-CM

## 2011-03-29 DIAGNOSIS — Z0000 Encounter for general adult medical examination without abnormal findings: Secondary | ICD-10-CM

## 2011-03-29 DIAGNOSIS — M1990 Unspecified osteoarthritis, unspecified site: Secondary | ICD-10-CM

## 2011-03-29 DIAGNOSIS — I10 Essential (primary) hypertension: Secondary | ICD-10-CM

## 2011-03-29 MED ORDER — DICLOFENAC SODIUM 1 % TD GEL: 1.0000 "application " | Freq: Three times a day (TID) | TRANSDERMAL | Status: DC | PRN

## 2011-03-29 MED ORDER — ZOSTER VACCINE LIVE 19400 UNT/0.65ML ~~LOC~~ SOLR: 0.6500 mL | Freq: Once | SUBCUTANEOUS | Status: AC

## 2011-03-29 MED ORDER — AZITHROMYCIN 250 MG PO TABS: ORAL_TABLET | ORAL | Status: AC

## 2011-03-29 NOTE — Assessment & Plan Note (Signed)
BP 141/75 today, representing acceptable BP control -continue current BP meds -checking bmet, cbc today

## 2011-03-29 NOTE — Progress Notes (Signed)
I have reviewed and discussed the care of this patient in detail with the resident physician including pertinent patient records, physical exam findings and data. I agree with details of this encounter.   

## 2011-03-29 NOTE — Assessment & Plan Note (Signed)
Patient's OA symptoms have improved with the increase in his Norco quantity at his last visit -continue Norco 60 tabs/month -continue gabapentin, voltaren gel

## 2011-03-29 NOTE — Assessment & Plan Note (Signed)
-  patient given prescription for zostavax -patient otherwise up to date on health maintenance

## 2011-03-29 NOTE — Assessment & Plan Note (Signed)
The patient presents with a 2-week history of cough productive of yellow-green sputum, with course breath sounds heard in right middle lobe, concerning for community acquired pneumonia -patient prescribed azithromycin

## 2011-03-29 NOTE — Progress Notes (Signed)
HPI The patient is a 76 yo man, history of HTN, HL, OA, lumbar spinal stenosis, and blindness, presenting for a follow-up visit.  The patient was last seen about 5 weeks ago, complaining of low back and RLE pain, with prior L4, L5, and S1 nerve root compression seen on CT in 2007.  At that time, the patient's norco quantity was increased from 45 tabs/month to 60 tabs/month, and the patient was encouraged to continue gabapentin and voltaren treatment.  Since that time, the patient notes improvement in his pain, stating that with 2 norco/day he is able to participate in his activities of daily living.  The patient also notes a 2-week history of a cough productive of yellow-green sputum, which has remained stable.  He also notes nasal congestion and rhinorrhea, though no fevers, chills, sinus pressure, headaches, or ear pain.  No dyspnea or SOB.  The patient notes that his grandson had previously been sick with a "cold" recently, which had resolved without medical treatment.  ROS: General: no fevers, chills, changes in weight, changes in appetite Skin: no rash HEENT: no blurry vision, hearing changes, sore throat Pulm: see HPI CV: no chest pain, palpitations, shortness of breath Abd: no abdominal pain, nausea/vomiting, diarrhea/constipation GU: no dysuria, hematuria, polyuria Ext: chronic OA, improved. Neuro: no weakness, numbness, or tingling  Filed Vitals:   03/29/11 1326  BP: 141/75  Pulse: 63  Temp: 97.3 F (36.3 C)    PEX General: alert, cooperative, and in no apparent distress HEENT: pupils equal round and reactive to light, vision grossly intact, oropharynx clear and non-erythematous, sinuses non-tender to palpation, tympanic membranes non-erythematous  Neck: supple, no lymphadenopathy, JVD, or carotid bruits Lungs: Course ronchi heard in right middle lobe, otherwise lungs clear to ascultation. Heart: regular rate and rhythm, no murmurs, gallops, or rubs Abdomen: soft, non-tender,  non-distended, normal bowel sounds Msk: no joint edema, warmth, or erythema Extremities: 2+ DP/PT pulses bilaterally, no cyanosis, clubbing, or edema Neurologic: alert & oriented X3, cranial nerves II-XII intact, strength grossly intact, sensation intact to light touch  Assessment/Plan

## 2011-03-29 NOTE — Patient Instructions (Addendum)
For your cough, take Azithromycin, 2 tablets today, then 1 tablet per day for 4 more days.  I'm glad to hear your joint pain has improved.  You may continue to use your regimen of norco, gabapentin, and voltaren gel.  Please return for a follow-up visit in 6 months, or sooner if your cough does not improve.

## 2011-03-30 LAB — BASIC METABOLIC PANEL
CO2: 28 mEq/L (ref 19–32)
Glucose, Bld: 102 mg/dL — ABNORMAL HIGH (ref 70–99)
Potassium: 4.4 mEq/L (ref 3.5–5.3)
Sodium: 142 mEq/L (ref 135–145)

## 2011-03-30 LAB — CBC
Hemoglobin: 11.5 g/dL — ABNORMAL LOW (ref 13.0–17.0)
MCHC: 30.7 g/dL (ref 30.0–36.0)
RBC: 4.24 MIL/uL (ref 4.22–5.81)
WBC: 6.2 10*3/uL (ref 4.0–10.5)

## 2011-05-01 ENCOUNTER — Other Ambulatory Visit: Payer: Self-pay | Admitting: Internal Medicine

## 2011-05-20 ENCOUNTER — Other Ambulatory Visit: Payer: Self-pay | Admitting: Internal Medicine

## 2011-05-26 ENCOUNTER — Other Ambulatory Visit: Payer: Self-pay | Admitting: Internal Medicine

## 2011-06-15 ENCOUNTER — Other Ambulatory Visit: Payer: Self-pay | Admitting: *Deleted

## 2011-06-15 DIAGNOSIS — M48061 Spinal stenosis, lumbar region without neurogenic claudication: Secondary | ICD-10-CM

## 2011-06-15 DIAGNOSIS — M1990 Unspecified osteoarthritis, unspecified site: Secondary | ICD-10-CM

## 2011-06-19 MED ORDER — HYDROCODONE-ACETAMINOPHEN 7.5-325 MG PO TABS: 1.0000 | ORAL_TABLET | ORAL | Status: DC | PRN

## 2011-06-19 NOTE — Telephone Encounter (Signed)
Hydrocodone 7.5/325mg rx called to [facility]. 

## 2011-07-09 ENCOUNTER — Other Ambulatory Visit: Payer: Self-pay | Admitting: Internal Medicine

## 2011-08-07 ENCOUNTER — Other Ambulatory Visit: Payer: Self-pay | Admitting: Internal Medicine

## 2011-08-12 ENCOUNTER — Other Ambulatory Visit: Payer: Self-pay | Admitting: Internal Medicine

## 2011-08-23 ENCOUNTER — Encounter: Payer: Self-pay | Admitting: Internal Medicine

## 2011-08-23 ENCOUNTER — Ambulatory Visit (INDEPENDENT_AMBULATORY_CARE_PROVIDER_SITE_OTHER): Payer: PRIVATE HEALTH INSURANCE | Admitting: Internal Medicine

## 2011-08-23 VITALS — BP 160/81 | HR 60 | Temp 97.3°F | Ht 71.0 in | Wt 252.0 lb

## 2011-08-23 DIAGNOSIS — M1710 Unilateral primary osteoarthritis, unspecified knee: Secondary | ICD-10-CM

## 2011-08-23 DIAGNOSIS — M159 Polyosteoarthritis, unspecified: Secondary | ICD-10-CM

## 2011-08-23 DIAGNOSIS — I739 Peripheral vascular disease, unspecified: Secondary | ICD-10-CM

## 2011-08-23 DIAGNOSIS — Z79891 Long term (current) use of opiate analgesic: Secondary | ICD-10-CM

## 2011-08-23 DIAGNOSIS — I10 Essential (primary) hypertension: Secondary | ICD-10-CM

## 2011-08-23 MED ORDER — DICLOFENAC SODIUM 75 MG PO TBEC: 75.0000 mg | DELAYED_RELEASE_TABLET | Freq: Two times a day (BID) | ORAL | Status: DC

## 2011-08-23 MED ORDER — AMLODIPINE BESYLATE 10 MG PO TABS: 10.0000 mg | ORAL_TABLET | Freq: Every day | ORAL | Status: DC

## 2011-08-23 MED ORDER — HYDROCODONE-ACETAMINOPHEN 7.5-750 MG PO TABS: 1.0000 | ORAL_TABLET | Freq: Four times a day (QID) | ORAL | Status: DC | PRN

## 2011-08-23 NOTE — Patient Instructions (Addendum)
Please, take all your medications as prescribed and call with any questions. Please, do not operate any machinery while taking Hydrocodone for pain. Please, fill in a prescription for a diclofenac for your knee pain; and knee highs for swelling in your legs. Please, note an increase in the dose of Amlodipine (Norvasc): take 10 mg once a day Please, follow up in 3 months or sooner.

## 2011-08-23 NOTE — Progress Notes (Signed)
Patient ID: Nicholas [name], male   DOB: 11/29/1934, 77 y.o.   MRN: [id number] HPI:    1. OA, generalized but worse in right knee. Has been treated with topical Diclofenac, Tramadol and PT in the past. Now requests "sometinthing stronger than Hydrocodone." Pain is described as stiffness and dull pain while walking 7/10 in intensity. Denies any fever, chills, rash, joint instability. Takes all his medications as prescribed with only mild pain relief. Review of Systems: Negative except per history of present illness  Physical Exam:  Nursing notes and vitals reviewed General:  alert, well-developed, and cooperative to examination.   Lungs:  normal respiratory effort, no accessory muscle use, normal breath sounds, no crackles, and no wheezes. Heart:  normal rate, regular rhythm, no murmurs, no gallop, and no rub.   Abdomen:  soft, non-tender, normal bowel sounds, no distention, no guarding, no rebound tenderness, no hepatomegaly, and no splenomegaly.   Extremities:  No cyanosis, clubbing, edema Neurologic:  alert & oriented X3, nonfocal exam  Meds: Medications Prior to Admission  Medication Sig Dispense Refill  . amLODipine (NORVASC) 5 MG tablet TAKE 1 TABLET BY MOUTH EVERY DAY  30 tablet  11  . aspirin 81 MG EC tablet Take 1 tablet (81 mg total) by mouth daily.  30 tablet  11  . carvedilol (COREG) 25 MG tablet TAKE 1 TABLET (25 MG TOTAL) BY MOUTH 2 (TWO) TIMES DAILY.  60 tablet  11  . CRESTOR 10 MG tablet TAKE 1 TABLET BY MOUTH AT BEDTIME  30 tablet  11  . CRESTOR 10 MG tablet TAKE 1 TABLET BY MOUTH AT BEDTIME  30 tablet  5  . [facility] ASPIRIN 81 MG EC tablet TAKE 1 TABLET BY MOUTH EVERY DAY  30 tablet  11  . diclofenac sodium (VOLTAREN) 1 % GEL Apply 1 application topically 3 (three) times daily as needed (for joint pain). To painful joints as directed  1 Tube  6  . enalapril-hydrochlorothiazide (VASERETIC) 10-25 MG per tablet TAKE 2 TABLETS BY MOUTH EVERY MORNING.  30 tablet  3  . gabapentin  (NEURONTIN) 600 MG tablet TAKE 1 TABLET (600 MG TOTAL) BY MOUTH DAILY.  45 tablet  4  . HYDROcodone-acetaminophen (NORCO) 7.5-325 MG per tablet Take 1 tablet by mouth every 4 (four) hours as needed. For pain  60 tablet  5  . loratadine (CLARITIN) 10 MG tablet Take 1 tablet (10 mg total) by mouth daily.  30 tablet  1  . pantoprazole (PROTONIX) 20 MG tablet TAKE 1 TABLET BY MOUTH ONCE A DAY  30 tablet  11  . rosuvastatin (CRESTOR) 10 MG tablet Take 1 tablet (10 mg total) by mouth daily.  30 tablet  5  . triamcinolone (KENALOG) 0.1 % ointment Apply topically 2 (two) times daily.  30 g  0   No current facility-administered medications on file as of 08/23/2011.    Allergies: Review of patient's allergies indicates no known allergies. Past Medical History  Diagnosis Date  . Gout     Lt Toe   Past Surgical History  Procedure Date  . Colon surgery    No family history on file. History   Social History  . Marital Status: Divorced    Spouse Name: N/A    Number of Children: N/A  . Years of Education: N/A   Occupational History  . Not on file.   Social History Main Topics  . Smoking status: Former Games developer  . Smokeless tobacco: Not on file  . Alcohol  Use: No  . Drug Use: Not on file  . Sexually Active: Not on file   Other Topics Concern  . Not on file   Social History Narrative  . No narrative on file    A/P: 1. HTN,poorly controlled -will increase Norvasc up to 10 mg Po daily -low salt diet -weight management -urine microalbumine  2. Severe OA of both knees - add Diclofenac 75 mg PO bid PRN with meals -continue with home PT exercises -continue with Diclofenac topical gel as Rx-ed -increase Vicodin to 7.5-325 mg PO QID PRN. Cautioned of risks of sedation, and addiction. Opiate contract is updated and the patient's signature obtained. Copy given to the patient. -UDS today  3. Peripheral vascular insufficiency -elevate LE's above heart level while being seated or   Supine. -low salt diet -compression knee highs with 26 mm HG   -

## 2011-08-24 LAB — PRESCRIPTION ABUSE MONITORING 15P, URINE
Amphetamine/Meth: NEGATIVE ng/mL
Barbiturate Screen, Urine: NEGATIVE ng/mL
Buprenorphine, Urine: NEGATIVE ng/mL
Fentanyl, Ur: NEGATIVE ng/mL
Meperidine, Ur: NEGATIVE ng/mL
Oxycodone Screen, Ur: NEGATIVE ng/mL
Propoxyphene: NEGATIVE ng/mL

## 2011-08-24 LAB — MICROALBUMIN / CREATININE URINE RATIO
Creatinine, Urine: 106.5 mg/dL
Microalb, Ur: 2.02 mg/dL — ABNORMAL HIGH (ref 0.00–1.89)

## 2011-08-25 LAB — OPIATES/OPIOIDS (LC/MS-MS)
Codeine Urine: NEGATIVE NG/ML
Heroin (6-AM), UR: NEGATIVE NG/ML

## 2011-09-25 ENCOUNTER — Other Ambulatory Visit: Payer: Self-pay | Admitting: *Deleted

## 2011-09-25 MED ORDER — GABAPENTIN 600 MG PO TABS: 600.0000 mg | ORAL_TABLET | Freq: Every day | ORAL | Status: DC

## 2011-10-07 ENCOUNTER — Other Ambulatory Visit: Payer: Self-pay | Admitting: Internal Medicine

## 2011-10-13 ENCOUNTER — Other Ambulatory Visit: Payer: Self-pay | Admitting: Internal Medicine

## 2011-10-18 ENCOUNTER — Ambulatory Visit (INDEPENDENT_AMBULATORY_CARE_PROVIDER_SITE_OTHER): Payer: PRIVATE HEALTH INSURANCE | Admitting: Internal Medicine

## 2011-10-18 ENCOUNTER — Encounter: Payer: Self-pay | Admitting: Internal Medicine

## 2011-10-18 VITALS — BP 136/78 | HR 61 | Temp 97.9°F | Ht 71.0 in | Wt 259.6 lb

## 2011-10-18 DIAGNOSIS — M1990 Unspecified osteoarthritis, unspecified site: Secondary | ICD-10-CM

## 2011-10-18 DIAGNOSIS — R600 Localized edema: Secondary | ICD-10-CM

## 2011-10-18 DIAGNOSIS — I10 Essential (primary) hypertension: Secondary | ICD-10-CM

## 2011-10-18 DIAGNOSIS — R609 Edema, unspecified: Secondary | ICD-10-CM | POA: Insufficient documentation

## 2011-10-18 MED ORDER — FUROSEMIDE 20 MG PO TABS: 20.0000 mg | ORAL_TABLET | Freq: Every day | ORAL | Status: DC

## 2011-10-18 MED ORDER — ENALAPRIL MALEATE 10 MG PO TABS: 10.0000 mg | ORAL_TABLET | Freq: Every day | ORAL | Status: DC

## 2011-10-18 NOTE — Assessment & Plan Note (Signed)
As per last visit, patient can continue with 7.5 mg Vicodin. He had stopped taking diclofenac because he thought it was a medicine to help his peripheral edema, which is worsened. I told him he could continue taking this. I also discussed the possibility of a sports medicine referral for joint injections. At present, however, it appears the patient's edema is causing him worsening pain, so we will work on that. - Continue Vicodin - Continue diclofenac - Consider sports medicine referral

## 2011-10-18 NOTE — Progress Notes (Signed)
Subjective:     Patient ID: Nicholas [name], male   DOB: [birth year], 77 y.o.   MRN: [id number]  HPI Patient is a 77 old gentleman with a history of hypertension, hyperlipidemia, and multi-joint osteoarthritis who presents for followup.  # OA: Patient continues to complain of pain in the back, hips, and knees. He was prescribed diclofenac at his last visit, but he thought this was a heart medicine that would help his peripheral edema, which it did not. He has stopped taking it. His peripheral edema is exacerbating his arthritis.  #Peripheral edema: Patient complains of progressively worsening bilateral peripheral edema, right greater than left. He says it has been progressing over the past 2-3 weeks, though he was evaluated in April for this problem. He was unable to obtain compression stockings, and he has not been elevating his legs. He denies orthopnea, chest pain, shortness of breath, or PND. He has no history of DVT.  Review of Systems As per history of present illness    Objective:   Physical Exam Filed Vitals:   10/18/11 1434 10/18/11 1515  BP: 146/81 136/78  Pulse: 65 61  Temp: 97.9 F (36.6 C)   TempSrc: Oral   Height: 5\' 11"  (1.803 m)   Weight: 259 lb 9.6 oz (117.754 kg)   SpO2: 95%   GEN: NAD.  Alert and oriented x 3.  Pleasant, conversant, and cooperative to exam. RESP:  CTAB, no w/r/r CARDIOVASCULAR: RRR, S1, S2, no m/r/g ABDOMEN: soft, NT/ND, NABS EXT: warm and dry. 1+ pitting edema in b/l LE, R>L      Assessment:         Plan:

## 2011-10-18 NOTE — Assessment & Plan Note (Signed)
Patient has noted progressive peripheral edema in the right greater than left leg. He does have edema on exam. He denies any symptoms consistent with CHF, but his last echo was in 2005 (normal) and he does have risk factors including hypertension. He has not been elevating his legs and was unable to obtain compression stockings. Differential includes heart failure, venous stasis, renal disease or low albumin. The patient has no history of any, no history of DVT that might promote venous stasis, and his skin is not consistent with venous stasis. Normal echo in the past. Normal CMP in the past. - 2-D echo ordered, was not scheduled because it will need preauthorization - Change HCTZ/enalapril to enalapril only - Start Lasix 20 mg daily - RTC in 2 weeks for a BMP check and symptom check

## 2011-11-01 ENCOUNTER — Ambulatory Visit (HOSPITAL_COMMUNITY): Payer: PRIVATE HEALTH INSURANCE | Attending: Internal Medicine

## 2011-11-09 ENCOUNTER — Other Ambulatory Visit: Payer: Self-pay | Admitting: Internal Medicine

## 2011-11-15 ENCOUNTER — Encounter: Payer: PRIVATE HEALTH INSURANCE | Admitting: Internal Medicine

## 2011-12-05 ENCOUNTER — Other Ambulatory Visit: Payer: Self-pay | Admitting: Internal Medicine

## 2011-12-09 ENCOUNTER — Other Ambulatory Visit: Payer: Self-pay | Admitting: Internal Medicine

## 2011-12-18 ENCOUNTER — Emergency Department (HOSPITAL_COMMUNITY)
Admission: EM | Admit: 2011-12-18 | Discharge: 2011-12-18 | Disposition: A | Discharge status: Home / Self Care | Payer: PRIVATE HEALTH INSURANCE | Attending: Emergency Medicine | Admitting: Emergency Medicine

## 2011-12-18 ENCOUNTER — Encounter (HOSPITAL_COMMUNITY): Payer: Self-pay | Admitting: Emergency Medicine

## 2011-12-18 DIAGNOSIS — I10 Essential (primary) hypertension: Secondary | ICD-10-CM | POA: Insufficient documentation

## 2011-12-18 DIAGNOSIS — R609 Edema, unspecified: Secondary | ICD-10-CM | POA: Insufficient documentation

## 2011-12-18 DIAGNOSIS — M7989 Other specified soft tissue disorders: Secondary | ICD-10-CM

## 2011-12-18 DIAGNOSIS — M25569 Pain in unspecified knee: Secondary | ICD-10-CM

## 2011-12-18 DIAGNOSIS — M25559 Pain in unspecified hip: Secondary | ICD-10-CM

## 2011-12-18 DIAGNOSIS — M1990 Unspecified osteoarthritis, unspecified site: Secondary | ICD-10-CM | POA: Insufficient documentation

## 2011-12-18 DIAGNOSIS — Z79899 Other long term (current) drug therapy: Secondary | ICD-10-CM | POA: Insufficient documentation

## 2011-12-18 DIAGNOSIS — K219 Gastro-esophageal reflux disease without esophagitis: Secondary | ICD-10-CM | POA: Insufficient documentation

## 2011-12-18 DIAGNOSIS — M79609 Pain in unspecified limb: Secondary | ICD-10-CM

## 2011-12-18 DIAGNOSIS — H543 Unqualified visual loss, both eyes: Secondary | ICD-10-CM | POA: Insufficient documentation

## 2011-12-18 HISTORY — DX: Unspecified visual loss: H54.7

## 2011-12-18 HISTORY — DX: Gastro-esophageal reflux disease without esophagitis: K21.9

## 2011-12-18 HISTORY — DX: Essential (primary) hypertension: I10

## 2011-12-18 MED ORDER — HYDROCODONE-ACETAMINOPHEN 5-500 MG PO TABS: 1.0000 | ORAL_TABLET | Freq: Four times a day (QID) | ORAL | Status: DC | PRN

## 2011-12-18 MED ORDER — IBUPROFEN 400 MG PO TABS
400.0000 mg | ORAL_TABLET | Freq: Once | ORAL | Status: AC
  Administered 2011-12-18: 400 mg via ORAL
  Filled 2011-12-18: qty 1

## 2011-12-18 MED ORDER — HYDROCODONE-ACETAMINOPHEN 5-325 MG PO TABS
1.0000 | ORAL_TABLET | Freq: Once | ORAL | Status: AC
  Administered 2011-12-18: 1 via ORAL
  Filled 2011-12-18: qty 1

## 2011-12-18 NOTE — Progress Notes (Signed)
*  PRELIMINARY RESULTS* Vascular Ultrasound Right lower extremity venous duplex has been completed.  Preliminary findings: Right= no evidence of DVT or baker's cyst.  Farrel Demark, RDMS, RVT  12/18/2011, 12:22 PM

## 2011-12-18 NOTE — ED Provider Notes (Signed)
History  This chart was scribed for Suzi Roots, MD by Bennett Scrape. This patient was seen in room TR04C/TR04C and the patient's care was started at 10:54AM.  CSN: [id number]  Arrival date & time 12/18/11  1001   None     Chief Complaint  Patient presents with  . Leg Pain     Patient is a 77 y.o. male presenting with leg pain. The history is provided by the patient. No language interpreter was used.  Leg Pain  The incident occurred more than 1 week ago. There was no injury mechanism. The pain is present in the right leg and left thigh. The pain has been constant since onset. Pertinent negatives include no numbness. He reports no foreign bodies present.    Prosper Paff is a 77 y.o. male who presents to the Emergency Department complaining of 2 weeks of constant left hip and bil knee pain. Constant, dull, non radiating, worse w wt bearing/walking/movement hip and knee. The pain started 2 to 3 years ago but is getting more severe. Pt reports that symptoms improve at night and are aggravated by walking. He denies any obvious injuries. He has a h/o GOUT, GERD and HTN. He is a former smoker but denies alcohol use.  No recent fall or acute/abrupt change in hip and knee pain. No back pain. No numbness/weakness. Also notes for past several weeks right leg swelling. No sob. No orthopnea or pnd. No cp. No hx dvt. No skin changes or erythema. No fever/chills.    Past Medical History  Diagnosis Date  . Gout     Lt Toe  . GERD (gastroesophageal reflux disease)   . Hypertension   . Blind     Past Surgical History  Procedure Date  . Colon surgery     History reviewed. No pertinent family history.  History  Substance Use Topics  . Smoking status: Former Games developer  . Smokeless tobacco: Not on file  . Alcohol Use: No      Review of Systems  Constitutional: Negative for fever and chills.  Respiratory: Negative for shortness of breath.   Cardiovascular: Positive for leg swelling.  Negative for chest pain.  Gastrointestinal: Negative for nausea and vomiting.  Musculoskeletal: Positive for arthralgias (in the left upper leg and right lower leg). Negative for back pain.  Skin: Negative for rash.  Neurological: Negative for weakness and numbness.    Allergies  Review of patient's allergies indicates no known allergies.  Home Medications   Current Outpatient Rx  Name Route Sig Dispense Refill  . AMLODIPINE BESYLATE 10 MG PO TABS Oral Take 10 mg by mouth daily.    . ASPIRIN EC 81 MG PO TBEC Oral Take 81 mg by mouth daily.    Marland Kitchen CARVEDILOL 25 MG PO TABS Oral Take 25 mg by mouth 2 (two) times daily with a meal.    . DICLOFENAC SODIUM 75 MG PO TBEC Oral Take 75 mg by mouth 2 (two) times daily.    Marland Kitchen DICLOFENAC SODIUM 1 % TD GEL Topical Apply 1 application topically 3 (three) times daily as needed. For joint pain.    . ENALAPRIL MALEATE 10 MG PO TABS Oral Take 1 tablet (10 mg total) by mouth daily. 30 tablet 11  . FUROSEMIDE 20 MG PO TABS Oral Take 1 tablet (20 mg total) by mouth daily. 30 tablet 11  . GABAPENTIN 600 MG PO TABS Oral Take 1 tablet (600 mg total) by mouth daily. 45 tablet 11  .  HYDROCODONE-ACETAMINOPHEN 7.5-325 MG PO TABS Oral Take 1 tablet by mouth every 4 (four) hours as needed. For pain.    Marland Kitchen PANTOPRAZOLE SODIUM 20 MG PO TBEC Oral Take 20 mg by mouth daily.      Triage Vitals: BP 111/95  Pulse 68  Temp 98.5 F (36.9 C) (Oral)  Resp 16  SpO2 92%  Physical Exam  Nursing note and vitals reviewed. Constitutional: He is oriented to person, place, and time. He appears well-developed and well-nourished. No distress.  HENT:  Head: Normocephalic and atraumatic.  Eyes: EOM are normal.  Neck: Neck supple. No tracheal deviation present.  Cardiovascular: Normal rate.   Pulmonary/Chest: Effort normal. No respiratory distress.  Musculoskeletal: Normal range of motion.       Mild edema to the right lower leg. Distal pulses palp. No skin changes or erythema.  Good rom at bil hips and knees passively without pain. No focal bony tenderness. No effusion/ or increased warmth to knees.   Neurological: He is alert and oriented to person, place, and time.  Skin: Skin is warm and dry.  Psychiatric: He has a normal mood and affect. His behavior is normal.    ED Course  Procedures (including critical care time)  DIAGNOSTIC STUDIES: Oxygen Saturation is 92% on room air, adequate by my interpretation.    COORDINATION OF CARE: 11:01AM-Discussed treatment plan which includes an [place] on the right leg and pain medications with pt at bedside and pt agreed to plan.     MDM  I personally performed the services described in this documentation, which was scribed in my presence. The recorded information has been reviewed and considered. Suzi Roots, MD   vicodin 1 po, motrin po. As RLE swollen as compared to right, will get vascular dopplers r/o dvt.  Pt has had previously imaged bil knees and hip - dx w oa in past. Recent opc visit for same.    Vascular doppler report neg as below   *PRELIMINARY RESULTS*  Vascular Ultrasound  Right lower extremity venous duplex has been completed. Preliminary findings: Right= no evidence of DVT or baker's cyst.  Farrel Demark, RDMS, RVT  12/18/2011, 12:22 PM             Suzi Roots, MD 12/18/11 1301

## 2011-12-18 NOTE — ED Notes (Signed)
Pt c/o left upper leg pain and right lower leg pain with some swelling that goes down at night x 2 weeks; pt denies obvious injury

## 2011-12-20 ENCOUNTER — Ambulatory Visit (INDEPENDENT_AMBULATORY_CARE_PROVIDER_SITE_OTHER): Payer: PRIVATE HEALTH INSURANCE | Admitting: Internal Medicine

## 2011-12-20 ENCOUNTER — Encounter: Payer: Self-pay | Admitting: Internal Medicine

## 2011-12-20 ENCOUNTER — Ambulatory Visit (HOSPITAL_COMMUNITY)
Admission: RE | Admit: 2011-12-20 | Discharge: 2011-12-20 | Disposition: A | Discharge status: Home / Self Care | Payer: PRIVATE HEALTH INSURANCE | Source: Ambulatory Visit | Attending: Internal Medicine | Admitting: Internal Medicine

## 2011-12-20 VITALS — BP 122/62 | HR 62 | Temp 98.2°F | Ht 71.0 in | Wt 256.2 lb

## 2011-12-20 DIAGNOSIS — R0602 Shortness of breath: Secondary | ICD-10-CM

## 2011-12-20 DIAGNOSIS — R062 Wheezing: Secondary | ICD-10-CM | POA: Insufficient documentation

## 2011-12-20 DIAGNOSIS — I10 Essential (primary) hypertension: Secondary | ICD-10-CM

## 2011-12-20 DIAGNOSIS — R600 Localized edema: Secondary | ICD-10-CM

## 2011-12-20 DIAGNOSIS — G8929 Other chronic pain: Secondary | ICD-10-CM

## 2011-12-20 LAB — COMPLETE METABOLIC PANEL WITH GFR
Albumin: 3.7 g/dL (ref 3.5–5.2)
Alkaline Phosphatase: 78 U/L (ref 39–117)
BUN: 19 mg/dL (ref 6–23)
CO2: 33 mEq/L — ABNORMAL HIGH (ref 19–32)
Calcium: 9.3 mg/dL (ref 8.4–10.5)
GFR, Est African American: 64 mL/min
GFR, Est Non African American: 55 mL/min — ABNORMAL LOW
Glucose, Bld: 100 mg/dL — ABNORMAL HIGH (ref 70–99)
Potassium: 3.7 mEq/L (ref 3.5–5.3)
Total Protein: 7.6 g/dL (ref 6.0–8.3)

## 2011-12-20 LAB — CBC WITH DIFFERENTIAL/PLATELET
Basophils Absolute: 0 10*3/uL (ref 0.0–0.1)
Basophils Relative: 0 % (ref 0–1)
HCT: 37.7 % — ABNORMAL LOW (ref 39.0–52.0)
Lymphocytes Relative: 33 % (ref 12–46)
MCHC: 31.8 g/dL (ref 30.0–36.0)
Neutro Abs: 2.1 10*3/uL (ref 1.7–7.7)
Neutrophils Relative %: 45 % (ref 43–77)
Platelets: 215 10*3/uL (ref 150–400)
RDW: 13.8 % (ref 11.5–15.5)
WBC: 4.6 10*3/uL (ref 4.0–10.5)

## 2011-12-20 LAB — PRO B NATRIURETIC PEPTIDE: Pro B Natriuretic peptide (BNP): 270.3 pg/mL — ABNORMAL HIGH (ref <126)

## 2011-12-20 LAB — TSH: TSH: 4.411 u[IU]/mL (ref 0.350–4.500)

## 2011-12-20 MED ORDER — HYDROCODONE-ACETAMINOPHEN 7.5-325 MG PO TABS: 1.0000 | ORAL_TABLET | ORAL | Status: DC | PRN

## 2011-12-20 MED ORDER — FUROSEMIDE 40 MG PO TABS: 40.0000 mg | ORAL_TABLET | Freq: Every day | ORAL | Status: DC

## 2011-12-20 NOTE — Patient Instructions (Signed)
For your shortness of breath and leg swelling, we are doing the following: -increase your Lasix (furosemide) medication to 40 mg, 1 tablet once per day -we are checking a chest x-ray today -we will help you schedule an echocardiogram -we are checking some labs, and I will call you with the lab results  Please return for a follow-up visit in 1 week to assess your shortness of breath.

## 2011-12-20 NOTE — Progress Notes (Signed)
HPI The patient is a 77 y.o. yo male with a history of osteoarthritis, blindness, HTN, and HL, presenting for a routine office visit.  The patient was recently seen for an office visit 10/18/11, with complaints of arthritis and edema.  The patient's HCTZ was stopped, and he was started on lasix 20/day.  The patient continued to have leg pain and swelling, and presented to the ED 2 days ago (12/18/11), where a doppler was negative for DVT.  The patient continues to note leg pain, described as left upper leg "muscle cramp" pain, from just proximal to his knee to his hip (though no pain at the knee itself).  He notes that percocet helps with the pain, and that he takes about 3 tabs/day, which helps his pain.  The patient notes a 2-3 day history of productive cough and shortness of breath.  He notes no sick contacts, no fevers/chills, sore throat, congestion.  He notes no PND or orthopnea, and no wheezing  ROS: General: no fevers, chills, changes in weight, changes in appetite Skin: no rash HEENT: no blurry vision, hearing changes, sore throat Pulm: no dyspnea, coughing, wheezing CV: no chest pain, palpitations, shortness of breath Abd: no abdominal pain, nausea/vomiting, diarrhea/constipation GU: no dysuria, hematuria, polyuria Ext: no arthralgias, myalgias Neuro: no weakness, numbness, or tingling  Filed Vitals:   12/20/11 1530  BP: 122/62  Pulse: 62  Temp: 98.2 F (36.8 C)  Pulse oximetry: 92%  PEX General: alert, cooperative, and in no apparent distress HEENT: pupils equal round and reactive to light, vision grossly intact, oropharynx clear and non-erythematous  Neck: supple, no lymphadenopathy Lungs: clear to ascultation bilaterally, normal work of respiration, no wheezes, rales, ronchi Heart: regular rate and rhythm, no murmurs, gallops, or rubs Abdomen: soft, non-tender, non-distended, normal bowel sounds Msk: no joint edema, warmth, or erythema Extremities: 2+ pitting edema  bilaterally Neurologic: alert & oriented X3, cranial nerves II-XII intact, strength grossly intact, sensation intact to light touch  Assessment/Plan

## 2011-12-27 ENCOUNTER — Ambulatory Visit (HOSPITAL_COMMUNITY)
Admission: RE | Admit: 2011-12-27 | Discharge: 2011-12-27 | Disposition: A | Discharge status: Home / Self Care | Payer: PRIVATE HEALTH INSURANCE | Source: Ambulatory Visit | Attending: Internal Medicine | Admitting: Internal Medicine

## 2011-12-27 ENCOUNTER — Ambulatory Visit (INDEPENDENT_AMBULATORY_CARE_PROVIDER_SITE_OTHER): Payer: PRIVATE HEALTH INSURANCE | Admitting: Internal Medicine

## 2011-12-27 ENCOUNTER — Encounter: Payer: Self-pay | Admitting: Internal Medicine

## 2011-12-27 ENCOUNTER — Telehealth: Payer: Self-pay | Admitting: *Deleted

## 2011-12-27 VITALS — BP 120/64 | HR 58 | Temp 98.4°F | Ht 71.0 in | Wt 256.5 lb

## 2011-12-27 DIAGNOSIS — I379 Nonrheumatic pulmonary valve disorder, unspecified: Secondary | ICD-10-CM | POA: Insufficient documentation

## 2011-12-27 DIAGNOSIS — R609 Edema, unspecified: Secondary | ICD-10-CM

## 2011-12-27 DIAGNOSIS — J449 Chronic obstructive pulmonary disease, unspecified: Secondary | ICD-10-CM

## 2011-12-27 DIAGNOSIS — J984 Other disorders of lung: Secondary | ICD-10-CM | POA: Insufficient documentation

## 2011-12-27 DIAGNOSIS — I517 Cardiomegaly: Secondary | ICD-10-CM

## 2011-12-27 DIAGNOSIS — J4489 Other specified chronic obstructive pulmonary disease: Secondary | ICD-10-CM

## 2011-12-27 DIAGNOSIS — I059 Rheumatic mitral valve disease, unspecified: Secondary | ICD-10-CM | POA: Insufficient documentation

## 2011-12-27 DIAGNOSIS — R0602 Shortness of breath: Secondary | ICD-10-CM | POA: Insufficient documentation

## 2011-12-27 DIAGNOSIS — R600 Localized edema: Secondary | ICD-10-CM

## 2011-12-27 HISTORY — DX: Chronic obstructive pulmonary disease, unspecified: J44.9

## 2011-12-27 LAB — BLOOD GAS, ARTERIAL
Acid-Base Excess: 5.5 mmol/L — ABNORMAL HIGH (ref 0.0–2.0)
Drawn by: 24486
FIO2: 0.21 %
O2 Saturation: 87.7 %
TCO2: 32.6 mmol/L (ref 0–100)
pCO2 arterial: 57.3 mmHg (ref 35.0–45.0)

## 2011-12-27 MED ORDER — ALBUTEROL SULFATE HFA 108 (90 BASE) MCG/ACT IN AERS: 2.0000 | INHALATION_SPRAY | Freq: Four times a day (QID) | RESPIRATORY_TRACT | Status: DC | PRN

## 2011-12-27 MED ORDER — FLUTICASONE-SALMETEROL 100-50 MCG/DOSE IN AEPB: 1.0000 | INHALATION_SPRAY | Freq: Two times a day (BID) | RESPIRATORY_TRACT | Status: DC

## 2011-12-27 MED ORDER — ALBUTEROL SULFATE (5 MG/ML) 0.5% IN NEBU: 2.5000 mg | INHALATION_SOLUTION | RESPIRATORY_TRACT | Status: DC

## 2011-12-27 MED ORDER — IPRATROPIUM BROMIDE 0.02 % IN SOLN
0.5000 mg | Freq: Once | RESPIRATORY_TRACT | Status: AC
  Administered 2011-12-27: 0.5 mg via RESPIRATORY_TRACT

## 2011-12-27 MED ORDER — ALBUTEROL SULFATE (2.5 MG/3ML) 0.083% IN NEBU
2.5000 mg | INHALATION_SOLUTION | Freq: Once | RESPIRATORY_TRACT | Status: AC
  Administered 2011-12-27: 2.5 mg via RESPIRATORY_TRACT

## 2011-12-27 MED ORDER — FUROSEMIDE 20 MG PO TABS
40.0000 mg | ORAL_TABLET | Freq: Once | ORAL | Status: AC
  Administered 2011-12-27: 40 mg via ORAL

## 2011-12-27 NOTE — Assessment & Plan Note (Signed)
BP well-controlled, will increase lasix as above

## 2011-12-27 NOTE — Progress Notes (Signed)
Pt"s oxygen saturation was 88% RA at rest.  Angelina Ok, RN 12/27/2011 9:45 AM.

## 2011-12-27 NOTE — Progress Notes (Signed)
  Echocardiogram 2D Echocardiogram has been performed.  Nicholas [name] 12/27/2011, 2:18 PM

## 2011-12-27 NOTE — Assessment & Plan Note (Addendum)
The patient notes a 2-3 day history of shortness of breath, with associated symptoms of LE edema, and pulse oximetry of 92%.  Symptoms may represent CHF exacerbation (echo from 2005 showed no CHF) vs URI vs pnuemonia (with desats).  The patient has no history of COPD, but does have a history of smoking. -check pro-BNP -check CXR today -order an Echo for sometime in the next few weeks -check CBC for anemia -check TSH -increase lasix from 20 mg/day to 40 mg/day -pending above work-up, consider PFT's if no other abnormality discovered -f/u in 1-2 weeks  Addendum: CXR unremarkable, pro-BNP normal for his age group

## 2011-12-27 NOTE — Telephone Encounter (Signed)
Call from Nicholas [name] from [facility] pt refused Oxygen delivery stating that he did not need it.  Call to pt stated that his girlfriend/wife said that he did not need the Oxygen.  Pt said that he was not at home when the oxygen was delivered.  Pt also said that there was a $24.00 charge that he did not know about.  Pt also said that he was told by Echo today that his heart was fine.  Pt was informed that the Oxygen was ordered for his lung problem.  Pt agreed to use the oxygen if he does not have to pay the $24.  Spoke with [facility] pt does not qualify for any discounts at this time and that the cost will be $26.14.  Spoke to pt said that his girlfriend/wfe had bills of her own so there was no money for the co-pay.  Pt was informed that the oxygen will help his leg pain as well as energy levels.  Pt said he really wanted to get rid of the leg pain and will see if he could pay at the beginning of the month.  Message was relayed to Dr. Burtis Junes.   Nicholas Ok, RN 12/27/2011 4:16 PM

## 2011-12-27 NOTE — Patient Instructions (Addendum)
It was a pleasure meeting you and your nephew Fredrik Cove today.  Because of your low oxygen readings when you were first seen in the clinic this morning we did some tests including the blood work and found that you possibly have a new diagnosis of COPD, chronic obstructive pulmonary disease. This is probably secondary to year-long history of smoking and continued exposure to secondhand smoke. Because her levels were low enough we feel is necessary for you to have home oxygen at 2 L when resting or sitting and 3 L when walking long distances or exercising. We will have pulmonary function tests done on 01/03/12 to determine at what stage of this disease that you have. -Start Advair inhaler daily every day whether you're feeling short of breath and not this will prevent future episodes of shortness of breath -Albuterol use this inhaler whenever you feel short of breath after your Advair -2 L of home oxygen and then 3 L when walking around-please also let everyone know who does smoke to not smoke nearly her oxygen tank as he can combust and that is a safety hazard both for you and the person smoking  In terms of your leg swelling and leg pain we are increasing her Lasix to 40 mg twice a day and continue that until your appointment on 01/03/12 and then go back to 40 mg once a day this will hopefully improve and decrease the fluid in your legs. If you notice an increase in about 3 pounds over 3 days please be seen either back in the clinic or in the emergency room. It is important also restrict her diet of salt and not eat more than 2 g per day -He will have an echo or pictures taken of your heart to see if that is what causing your leg swelling -Like to see you after both your lung tests and heart pictures are taken to discuss if any change needs to be made  Please do not hesitate to call the clinic with any questions at any time.  Thank you.
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
## 2011-12-29 ENCOUNTER — Telehealth: Payer: Self-pay | Admitting: Internal Medicine

## 2011-12-29 NOTE — Telephone Encounter (Signed)
F/u call to pt to monitor status and conflict with O2. Pt reports didn't want to pay $26 co-pay and therefore refused O2. He reports feeling well and not SOB or DOE and that the new inhalers prescribed are providing relief. The point of O2 was stressed since his saturation levels were in the 80s during his last 2 visits. Pt reports he plans to keep his appt on 01/03/12 for the PFTs and then will rediscuss the O2 therapy at that time. Overall his legs are feeling better and he reports decrease swelling.

## 2011-12-31 NOTE — Progress Notes (Signed)
INTERNAL MEDICINE TEACHING ATTENDING ADDENDUM - Jonah Blue, DO: I personally saw and evaluated Nicholas [name] in this clinic visit in conjunction with the resident, Dr.Nora [facility]. I have discussed patient's plan of care with medical resident during this visit. I have confirmed the physical exam findings and have read and agree with the clinic note including the plan with the following addition: Nicholas [name] may have evidence of RHF, COPD. He has significantly decreased breath sounds on exam. He is seen to desaturate on RA both at rest and exertion. I would begin diuresis, start Advair and continue albuterol. He needs PFT's and TTE. He will need 2 L O2 at rest and 3 L O2 with exertion.

## 2012-01-03 ENCOUNTER — Telehealth: Payer: Self-pay | Admitting: *Deleted

## 2012-01-03 ENCOUNTER — Encounter: Payer: PRIVATE HEALTH INSURANCE | Admitting: Internal Medicine

## 2012-01-03 ENCOUNTER — Inpatient Hospital Stay (HOSPITAL_COMMUNITY): Admission: RE | Admit: 2012-01-03 | Payer: PRIVATE HEALTH INSURANCE | Source: Ambulatory Visit

## 2012-01-03 NOTE — Telephone Encounter (Signed)
Call to Respiratory to see if pt came for his PFT's this am. Pt.appointment today at 10:00 AM.  Call to pt spoke with [facility] said that pt did not have transportation for his visits today.  Appointment for PFT's was rescheduled for 01/09/2012 at 9:00 AM.  Pt's appointment in the Clinics has been rescheduled for 01/17/2012 at 10:30 AM with Dr. Tonny Branch .  Rowan Blase was called and given the appointments.  Pt per Rowan Blase is using the Oxygen and plans to get more the first of the month.  Angelina Ok, RN 01/03/2012 11:55 AM.

## 2012-01-09 ENCOUNTER — Encounter (HOSPITAL_COMMUNITY): Payer: PRIVATE HEALTH INSURANCE

## 2012-01-16 ENCOUNTER — Ambulatory Visit (HOSPITAL_COMMUNITY)
Admission: RE | Admit: 2012-01-16 | Discharge: 2012-01-16 | Disposition: A | Discharge status: Home / Self Care | Payer: PRIVATE HEALTH INSURANCE | Source: Ambulatory Visit | Attending: Internal Medicine | Admitting: Internal Medicine

## 2012-01-16 DIAGNOSIS — J449 Chronic obstructive pulmonary disease, unspecified: Secondary | ICD-10-CM

## 2012-01-16 DIAGNOSIS — R0602 Shortness of breath: Secondary | ICD-10-CM | POA: Insufficient documentation

## 2012-01-16 MED ORDER — ALBUTEROL SULFATE (5 MG/ML) 0.5% IN NEBU
2.5000 mg | INHALATION_SOLUTION | Freq: Once | RESPIRATORY_TRACT | Status: AC
  Administered 2012-01-16: 2.5 mg via RESPIRATORY_TRACT

## 2012-01-17 ENCOUNTER — Encounter: Payer: Self-pay | Admitting: Internal Medicine

## 2012-01-17 ENCOUNTER — Ambulatory Visit (INDEPENDENT_AMBULATORY_CARE_PROVIDER_SITE_OTHER): Payer: PRIVATE HEALTH INSURANCE | Admitting: Internal Medicine

## 2012-01-17 VITALS — BP 158/89 | HR 65 | Temp 98.6°F | Wt 255.6 lb

## 2012-01-17 DIAGNOSIS — R609 Edema, unspecified: Secondary | ICD-10-CM

## 2012-01-17 DIAGNOSIS — I5032 Chronic diastolic (congestive) heart failure: Secondary | ICD-10-CM | POA: Insufficient documentation

## 2012-01-17 DIAGNOSIS — J449 Chronic obstructive pulmonary disease, unspecified: Secondary | ICD-10-CM

## 2012-01-17 DIAGNOSIS — Z23 Encounter for immunization: Secondary | ICD-10-CM

## 2012-01-17 DIAGNOSIS — I509 Heart failure, unspecified: Secondary | ICD-10-CM

## 2012-01-17 DIAGNOSIS — I10 Essential (primary) hypertension: Secondary | ICD-10-CM

## 2012-01-17 HISTORY — DX: Chronic diastolic (congestive) heart failure: I50.32

## 2012-01-17 NOTE — Patient Instructions (Addendum)
1.  Make sure you take the Lasix 40 mg tablet.  Take it first thing in the morning every morning.    2.  Take the Advair inhaler twice daily EVERY day.  This medicine is to keep you feeling better not treat an acute symptom.    3.  Keep moving every day if you can.  4.  Please bring all of your medications to your follow up visit.  Follow up with me in 2 weeks to recheck your blood pressure as well as to see how your leg is doing.

## 2012-01-17 NOTE — Progress Notes (Signed)
Subjective:   Patient ID: Nicholas [name] male   DOB: 1934/12/05 77 y.o.   MRN: [id number]  HPI: Nicholas [name] is a 77 y.o. man who presents to clinic today for follow up from his last appointment.  His nephew Fredrik Cove accompanies him today.    He underwent the TTE after his last appointment and is curious about the result.  He states that he continues to have problems with swelling in his legs, especially when he is up walking for a while.  He states that the swelling is better in the morning when he wakes up.  He has only been taking the lasix 2-3 times per week.   He states that he continues to have problems with left leg pain for the last month or two.  He states that the pain is on the side of the leg and goes up the outside of the hip.  It is worse when he is walking.  He states that when he stops and sits down for about 5 minutes that it gets better and he is able to go again.  He denies weakness in the leg or numbness and tingling in the leg.    He refuses to get the oxygen that was prescribed at his last appointment because he feels that he doesn't need it and that it would cost him 25 dollars or so per month.  He states he doesn't understand why we felt he needed the oxygen since he states that he doesn't feel short of breath.  He states that he has been using Advair as needed for his shortness of breath and that it "doesn't do a darn thing for my breathing."  He has not been using the albuterol.    He states that he would like to get his flu shot today.   Past Medical History  Diagnosis Date  . Gout     Lt Toe  . GERD (gastroesophageal reflux disease)   . Hypertension   . Blind    Current Outpatient Prescriptions  Medication Sig Dispense Refill  . albuterol (PROVENTIL HFA;VENTOLIN HFA) 108 (90 BASE) MCG/ACT inhaler Inhale 2 puffs into the lungs every 6 (six) hours as needed for wheezing.  1 Inhaler  2  . amLODipine (NORVASC) 10 MG tablet Take 10 mg by mouth daily.      Marland Kitchen  aspirin EC 81 MG tablet Take 81 mg by mouth daily.      . carvedilol (COREG) 25 MG tablet Take 25 mg by mouth 2 (two) times daily with a meal.      . diclofenac (VOLTAREN) 75 MG EC tablet Take 75 mg by mouth 2 (two) times daily.      . diclofenac sodium (VOLTAREN) 1 % GEL Apply 1 application topically 3 (three) times daily as needed. For joint pain.      Marland Kitchen enalapril (VASOTEC) 10 MG tablet Take 1 tablet (10 mg total) by mouth daily.  30 tablet  11  . Fluticasone-Salmeterol (ADVAIR DISKUS) 100-50 MCG/DOSE AEPB Inhale 1 puff into the lungs 2 (two) times daily.  1 each  30  . furosemide (LASIX) 40 MG tablet Take 1 tablet (40 mg total) by mouth daily.  30 tablet  11  . gabapentin (NEURONTIN) 600 MG tablet Take 1 tablet (600 mg total) by mouth daily.  45 tablet  11  . HYDROcodone-acetaminophen (NORCO) 7.5-325 MG per tablet Take 1 tablet by mouth every 4 (four) hours as needed. For pain.  120 tablet  5  .  HYDROcodone-acetaminophen (VICODIN) 5-500 MG per tablet Take 1 tablet by mouth every 6 (six) hours as needed for pain.  20 tablet  0  . pantoprazole (PROTONIX) 20 MG tablet Take 20 mg by mouth daily.       No current facility-administered medications for this visit.   Facility-Administered Medications Ordered in Other Visits  Medication Dose Route Frequency Provider Last Rate Last Dose  . albuterol (PROVENTIL) (5 MG/ML) 0.5% nebulizer solution 2.5 mg  2.5 mg Nebulization Once Leslye Peer, MD   2.5 mg at 01/16/12 1045   No family history on file. History   Social History  . Marital Status: Divorced    Spouse Name: N/A    Number of Children: N/A  . Years of Education: N/A   Social History Main Topics  . Smoking status: Former Games developer  . Smokeless tobacco: None  . Alcohol Use: No  . Drug Use: None  . Sexually Active: None   Other Topics Concern  . None   Social History Narrative  . None   Review of Systems: Constitutional: Denies fever, chills, diaphoresis, appetite change and  fatigue.  HEENT: Denies photophobia, eye pain, redness, hearing loss, ear pain, congestion, sore throat, rhinorrhea, sneezing, mouth sores, trouble swallowing, neck pain, neck stiffness and tinnitus.   Respiratory: Denies SOB, DOE, cough, chest tightness,  and wheezing.   Cardiovascular: Denies chest pain, palpitations and leg swelling.  Gastrointestinal: Denies nausea, vomiting, abdominal pain, diarrhea, constipation, blood in stool and abdominal distention.  Genitourinary: Denies dysuria, urgency, frequency, hematuria, flank pain and difficulty urinating.  Musculoskeletal: Denies myalgias, back pain, joint swelling, arthralgias and gait problem.  Skin: Positive for bilateral leg swelling.  Denies pallor, rash and wound.  Neurological: Denies dizziness, seizures, syncope, weakness, light-headedness, numbness and headaches.  Hematological: Denies adenopathy. Easy bruising, personal or family bleeding history  Psychiatric/Behavioral: Denies suicidal ideation, mood changes, confusion, nervousness, sleep disturbance and agitation  Objective:  Physical Exam: Filed Vitals:   01/17/12 1008  BP: 176/104  Pulse: 93  Temp: 98.6 F (37 C)  TempSrc: Oral  Weight: 255 lb 9.6 oz (115.939 kg)  SpO2: 92%   Constitutional: Vital signs reviewed.  Patient is a well-developed and well-nourished blind man in no acute distress and cooperative with exam. Alert and oriented x3.  Head: Normocephalic and atraumatic Ear: TM normal bilaterally Mouth: no erythema or exudates, MMM Eyes: conjunctivae normal, No scleral icterus.  Neck: Supple, Trachea midline normal ROM, No JVD, mass, thyromegaly, or carotid bruit present.  Cardiovascular: RRR, S1 normal, S2 normal, no MRG, pulses symmetric and intact bilaterally Pulmonary/Chest: end expiratory wheezes noted in all lung fields. no rales, or rhonchi Abdominal: Soft. Non-tender, non-distended, bowel sounds are normal, no masses, organomegaly, or guarding present.    GU: no CVA tenderness Musculoskeletal: There is tenderness along the IT band on the left and right legs with decreased ROM in forward flexion of the hips.   No joint deformities, erythema, or stiffness, ROM full and no nontender Hematology: no cervical, inginal, or axillary adenopathy.  Neurological: A&O x3, Strength is normal and symmetric bilaterally, cranial nerves are grossly intact, no focal motor deficit, sensory intact to light touch bilaterally.  Skin: 2+ pitting edema to the bilateral hip. Warm, dry and intact. No rash, cyanosis, or clubbing.  Psychiatric: Normal mood and affect. speech and behavior is normal. Judgment and thought content normal. Cognition and memory are normal.   Assessment & Plan:

## 2012-01-23 ENCOUNTER — Other Ambulatory Visit: Payer: Self-pay | Admitting: Internal Medicine

## 2012-01-24 ENCOUNTER — Encounter: Payer: Self-pay | Admitting: Internal Medicine

## 2012-01-31 ENCOUNTER — Ambulatory Visit (INDEPENDENT_AMBULATORY_CARE_PROVIDER_SITE_OTHER): Payer: PRIVATE HEALTH INSURANCE | Admitting: Internal Medicine

## 2012-01-31 ENCOUNTER — Encounter: Payer: Self-pay | Admitting: Internal Medicine

## 2012-01-31 VITALS — BP 144/74 | HR 73 | Temp 98.0°F | Ht 71.0 in | Wt 258.7 lb

## 2012-01-31 DIAGNOSIS — R609 Edema, unspecified: Secondary | ICD-10-CM

## 2012-01-31 DIAGNOSIS — M5430 Sciatica, unspecified side: Secondary | ICD-10-CM

## 2012-01-31 DIAGNOSIS — J449 Chronic obstructive pulmonary disease, unspecified: Secondary | ICD-10-CM

## 2012-01-31 DIAGNOSIS — R600 Localized edema: Secondary | ICD-10-CM

## 2012-01-31 DIAGNOSIS — I10 Essential (primary) hypertension: Secondary | ICD-10-CM

## 2012-01-31 DIAGNOSIS — J4489 Other specified chronic obstructive pulmonary disease: Secondary | ICD-10-CM

## 2012-01-31 MED ORDER — FUROSEMIDE 20 MG PO TABS: 20.0000 mg | ORAL_TABLET | Freq: Every day | ORAL | Status: DC

## 2012-01-31 MED ORDER — DICLOFENAC SODIUM 1 % TD GEL: 1.0000 "application " | Freq: Three times a day (TID) | TRANSDERMAL | Status: DC | PRN

## 2012-01-31 NOTE — Patient Instructions (Addendum)
1.  Make sure you are taking your breathing medication twice daily.  2. Decrease the Lasix to 20 mg tablets.  Take 1 daily for your swelling and blood pressure.  3.  Continue your other medications as prescribed.   4.  Follow up with Dr. Manson Passey in about 2-3 months.

## 2012-01-31 NOTE — Progress Notes (Signed)
Subjective:   Patient ID: Nicholas [name] male   DOB: 1935-01-18 77 y.o.   MRN: [id number]  HPI: [name] is a 77 y.o. man who presents to the clinic today for follow up on his chronic medical conditions including hypertension, COPD, and sciatica. See Problem focused Assessment and Plan for full details of his chronic medical conditions.   Past Medical History  Diagnosis Date  . Gout     Lt Toe  . GERD (gastroesophageal reflux disease)   . Hypertension   . Blind    Current Outpatient Prescriptions  Medication Sig Dispense Refill  . albuterol (PROVENTIL HFA;VENTOLIN HFA) 108 (90 BASE) MCG/ACT inhaler Inhale 2 puffs into the lungs every 6 (six) hours as needed for wheezing.  1 Inhaler  2  . amLODipine (NORVASC) 10 MG tablet Take 10 mg by mouth daily.      Marland Kitchen aspirin EC 81 MG tablet Take 81 mg by mouth daily.      . carvedilol (COREG) 25 MG tablet Take 25 mg by mouth 2 (two) times daily with a meal.      . CRESTOR 10 MG tablet Take 10 mg by mouth Daily.      . diclofenac (VOLTAREN) 75 MG EC tablet Take 75 mg by mouth 2 (two) times daily.      . diclofenac sodium (VOLTAREN) 1 % GEL Apply 1 application topically 3 (three) times daily as needed. For joint pain.      Marland Kitchen enalapril (VASOTEC) 10 MG tablet Take 1 tablet (10 mg total) by mouth daily.  30 tablet  11  . Fluticasone-Salmeterol (ADVAIR DISKUS) 100-50 MCG/DOSE AEPB Inhale 1 puff into the lungs 2 (two) times daily.  1 each  30  . furosemide (LASIX) 40 MG tablet Take 1 tablet (40 mg total) by mouth daily.  30 tablet  11  . gabapentin (NEURONTIN) 600 MG tablet Take 1 tablet (600 mg total) by mouth daily.  45 tablet  11  . HYDROcodone-acetaminophen (NORCO) 7.5-325 MG per tablet Take 1 tablet by mouth every 4 (four) hours as needed. For pain.  120 tablet  5  . HYDROcodone-acetaminophen (VICODIN) 5-500 MG per tablet Take 1 tablet by mouth every 6 (six) hours as needed for pain.  20 tablet  0  . pantoprazole (PROTONIX) 20 MG tablet  TAKE 1 TABLET BY MOUTH ONCE A DAY  30 tablet  11   No family history on file. History   Social History  . Marital Status: Divorced    Spouse Name: N/A    Number of Children: N/A  . Years of Education: N/A   Social History Main Topics  . Smoking status: Former Games developer  . Smokeless tobacco: None  . Alcohol Use: No  . Drug Use: None  . Sexually Active: None   Other Topics Concern  . None   Social History Narrative  . None   Review of Systems: A full 12 system ROS is negative except as noted in the HPI and A&P.   Objective:  Physical Exam: Filed Vitals:   01/31/12 1019  BP: 144/74  Pulse: 73  Temp: 98 F (36.7 C)  TempSrc: Oral  Height: 5\' 11"  (1.803 m)  Weight: 258 lb 11.2 oz (117.346 kg)  SpO2: 96%   Constitutional: Vital signs reviewed.  Patient is a well-developed and well-nourished elderly appearing man in no acute distress and cooperative with exam. Alert and oriented x3.  Head: Normocephalic and atraumatic Ear: TM normal bilaterally Mouth: no erythema or exudates,  MMM Eyes: PERRL, EOMI, conjunctivae normal, No scleral icterus.  Neck: Supple, Trachea midline normal ROM, No JVD, mass, thyromegaly, or carotid bruit present.  Cardiovascular: RRR, S1 normal, S2 normal, no MRG, pulses symmetric and intact bilaterally Pulmonary/Chest: CTAB, no wheezes, rales, or rhonchi Abdominal: Soft. Non-tender, non-distended, bowel sounds are normal, no masses, organomegaly, or guarding present.  GU: no CVA tenderness Musculoskeletal: there is moderate point tenderness over the bilateral SI joints.  No joint deformities, erythema, or stiffness, ROM full and no nontender Hematology: no cervical, inginal, or axillary adenopathy.  Neurological: A&O x3, Strength is normal and symmetric bilaterally, cranial nerve II-XII are grossly intact, no focal motor deficit, sensory intact to light touch bilaterally.  Skin: 1+ pitting edema to the knee bilaterally.  Warm, dry and intact. No rash,  cyanosis, or clubbing.  Psychiatric: Normal mood and affect. speech and behavior is normal. Judgment and thought content normal. Cognition and memory are normal.   Assessment & Plan:

## 2012-03-20 ENCOUNTER — Encounter: Payer: PRIVATE HEALTH INSURANCE | Admitting: Internal Medicine

## 2012-04-11 ENCOUNTER — Ambulatory Visit (INDEPENDENT_AMBULATORY_CARE_PROVIDER_SITE_OTHER): Payer: PRIVATE HEALTH INSURANCE | Admitting: Internal Medicine

## 2012-04-11 ENCOUNTER — Encounter: Payer: Self-pay | Admitting: Internal Medicine

## 2012-04-11 VITALS — BP 165/84 | HR 71 | Temp 97.8°F | Ht 71.0 in | Wt 250.8 lb

## 2012-04-11 DIAGNOSIS — N529 Male erectile dysfunction, unspecified: Secondary | ICD-10-CM

## 2012-04-11 DIAGNOSIS — M25559 Pain in unspecified hip: Secondary | ICD-10-CM

## 2012-04-11 DIAGNOSIS — F528 Other sexual dysfunction not due to a substance or known physiological condition: Secondary | ICD-10-CM

## 2012-04-11 DIAGNOSIS — G8929 Other chronic pain: Secondary | ICD-10-CM

## 2012-04-11 DIAGNOSIS — J449 Chronic obstructive pulmonary disease, unspecified: Secondary | ICD-10-CM

## 2012-04-11 DIAGNOSIS — I10 Essential (primary) hypertension: Secondary | ICD-10-CM

## 2012-04-11 MED ORDER — GABAPENTIN 600 MG PO TABS: 600.0000 mg | ORAL_TABLET | Freq: Two times a day (BID) | ORAL | Status: DC

## 2012-04-11 MED ORDER — ENALAPRIL MALEATE 20 MG PO TABS: 20.0000 mg | ORAL_TABLET | Freq: Every day | ORAL | Status: DC

## 2012-04-11 MED ORDER — TADALAFIL 2.5 MG PO TABS: 2.5000 mg | ORAL_TABLET | Freq: Every day | ORAL | Status: DC | PRN

## 2012-04-11 MED ORDER — PANTOPRAZOLE SODIUM 20 MG PO TBEC: 20.0000 mg | DELAYED_RELEASE_TABLET | Freq: Every day | ORAL | Status: DC

## 2012-04-11 MED ORDER — HYDROCODONE-ACETAMINOPHEN 10-325 MG PO TABS: 1.0000 | ORAL_TABLET | Freq: Four times a day (QID) | ORAL | Status: DC | PRN

## 2012-04-11 MED ORDER — ALBUTEROL SULFATE HFA 108 (90 BASE) MCG/ACT IN AERS: 2.0000 | INHALATION_SPRAY | Freq: Four times a day (QID) | RESPIRATORY_TRACT | Status: DC | PRN

## 2012-04-11 MED ORDER — ROSUVASTATIN CALCIUM 10 MG PO TABS: 10.0000 mg | ORAL_TABLET | Freq: Every day | ORAL | Status: DC

## 2012-04-11 NOTE — Assessment & Plan Note (Signed)
The patient has a history of ED, and continues to note symptoms.  He would like to try cialis, and has a Banker for a free 57-month supply of the 2.5 mg daily prescription.  He has no contraindications to this medication. -will try a 67-month trial of cialis 2.5 mg/day

## 2012-04-11 NOTE — Assessment & Plan Note (Addendum)
The patient has a history of chronic right leg pain.  He appears to be developing tolerance to hydrocodone.  We'll increase his dose of hydrocodone cautiously, given his age.  We had a long discussion about the side effects of narcotic pain medications, and patient was instructed to watch for constipation, dizziness, lightheadedness, and nausea. -increase hydrocodone-acetaminophen from 7.5-325 to 10-325 -signed new pain contract -continue gabapentin 600 mg BID prn

## 2012-04-11 NOTE — Assessment & Plan Note (Signed)
Patient continues to decline O2 therapy.  PFT report raised concern for perhaps interstitial fibrosis..  The patient declines further imaging today to evaluate this problem, stating that he has symptomatically improved without treatment. -continue to address at future visits

## 2012-04-11 NOTE — Assessment & Plan Note (Signed)
BP is elevated today.  Chart review reveals that it has been somewhat elevated the last 3 times he has been in clinic, though it was variably well-controlled before that.   -increase enalapril to 20 mg/day -check BMET at next visit, given CKD, to ensure creatinine does not rise by >20%

## 2012-04-11 NOTE — Patient Instructions (Signed)
For your blood pressure, we are increasing your Enalapril to 20 mg.  Take 1 tablet once per day.  Please return for a follow-up visit in 3 months.

## 2012-04-11 NOTE — Progress Notes (Signed)
HPI The patient is a 77 y.o. male with a history of blindness, HTN, HL, COPD, presenting for a follow-up visit.  The patient continues to note right leg pain, which is a chronic issue for the patient.  He notes that the pain is somewhat worse since the weather change, causing him to run out of his prescription of hydrocodone-acetaminophen 5 days early, due to taking up to 2 pills at a time for pain relief.  The patient's BP is elevated today.  At his last visit, lasix was decreased from 40 mg/day to 20 mg/day, due to symptoms of polyuria, which was difficult for the patient given his blindness.  The patient has a history of shortness of breath, and has been noted to have oxygen desaturations in the past.  He notes no SOB today, and O2 sat is 99% at rest.  The patient was previously prescribed home O2, but declined this therapy after learning that it would cost him $25/month.  The patient is still not interested in starting this treatment.  Echo shows EF 55%, and PFT's show an FEV1/FVC ratio of 81%.  The patient also notes difficulties obtaining and maintaining an erection.  He states he received a voucher to try cialis, and would like a prescription to try this medication.  The patient has no prescription for nitro-containing medications.  ROS: General: no fevers, chills, changes in weight, changes in appetite Skin: no rash HEENT: no blurry vision, hearing changes, sore throat Pulm: no dyspnea, coughing, wheezing CV: no chest pain, palpitations, shortness of breath Abd: no abdominal pain, nausea/vomiting, diarrhea/constipation GU: no dysuria, hematuria, polyuria Ext: +chronic leg pain Neuro: no weakness, numbness, or tingling  Filed Vitals:   04/11/12 1608  BP: 165/84  Pulse: 71  Temp: 97.8 F (36.6 C)    PEX General: alert, cooperative, and in no apparent distress HEENT: pupils equal round and reactive to light, vision grossly intact, oropharynx clear and non-erythematous  Neck:  supple, no lymphadenopathy Lungs: clear to ascultation bilaterally, normal work of respiration, no wheezes, rales, ronchi Heart: regular rate and rhythm, no murmurs, gallops, or rubs Abdomen: soft, non-tender, non-distended, normal bowel sounds Extremities: 2+ DP/PT pulses bilaterally, no cyanosis, clubbing, or edema Neurologic: alert & oriented X3, cranial nerves II-XII intact, strength grossly intact, sensation intact to light touch  Current Outpatient Prescriptions on File Prior to Visit  Medication Sig Dispense Refill  . albuterol (PROVENTIL HFA;VENTOLIN HFA) 108 (90 BASE) MCG/ACT inhaler Inhale 2 puffs into the lungs every 6 (six) hours as needed for wheezing.  1 Inhaler  2  . amLODipine (NORVASC) 10 MG tablet Take 10 mg by mouth daily.      Marland Kitchen aspirin EC 81 MG tablet Take 81 mg by mouth daily.      . carvedilol (COREG) 25 MG tablet Take 25 mg by mouth 2 (two) times daily with a meal.      . CRESTOR 10 MG tablet Take 10 mg by mouth Daily.      . diclofenac sodium (VOLTAREN) 1 % GEL Apply 1 application topically 3 (three) times daily as needed. For joint pain.  200 g  6  . enalapril (VASOTEC) 10 MG tablet Take 1 tablet (10 mg total) by mouth daily.  30 tablet  11  . Fluticasone-Salmeterol (ADVAIR DISKUS) 100-50 MCG/DOSE AEPB Inhale 1 puff into the lungs 2 (two) times daily.  1 each  30  . furosemide (LASIX) 20 MG tablet Take 1 tablet (20 mg total) by mouth daily.  30  tablet  11  . gabapentin (NEURONTIN) 600 MG tablet Take 1 tablet (600 mg total) by mouth daily.  45 tablet  11  . HYDROcodone-acetaminophen (NORCO) 7.5-325 MG per tablet Take 1 tablet by mouth every 4 (four) hours as needed. For pain.  120 tablet  5  . HYDROcodone-acetaminophen (VICODIN) 5-500 MG per tablet Take 1 tablet by mouth every 6 (six) hours as needed for pain.  20 tablet  0  . pantoprazole (PROTONIX) 20 MG tablet TAKE 1 TABLET BY MOUTH ONCE A DAY  30 tablet  11    Assessment/Plan

## 2012-05-16 ENCOUNTER — Telehealth: Payer: Self-pay | Admitting: Internal Medicine

## 2012-05-16 NOTE — Telephone Encounter (Signed)
I received a note on my desktop that this patient was trying to reach me.  I returned the patient's call.  He notes a 10-month history of "gas pains", described as bilateral "side pains" (not flank or abdominal pains), improved after passing gas.  He also notes constipation, using laxatives as needed.  Of note, his narcotic pain medication was increased at his last visit 1 month ago.  The patient asks for a clinic visit for evaluation.  Based on patient's descriptions, symptoms appear consistent with constipation, likely from narcotic usage.  Patient encouraged to start using OTC fiber supplement.  We'll set the patient up an appointment to be seen in [facility] as well.  Awanda Mink 05/16/2012, 6:48 PM

## 2012-05-22 ENCOUNTER — Encounter: Payer: Self-pay | Admitting: Internal Medicine

## 2012-05-22 ENCOUNTER — Ambulatory Visit (INDEPENDENT_AMBULATORY_CARE_PROVIDER_SITE_OTHER): Payer: PRIVATE HEALTH INSURANCE | Admitting: Internal Medicine

## 2012-05-22 VITALS — BP 178/77 | HR 77 | Temp 97.2°F | Ht 71.0 in | Wt 253.7 lb

## 2012-05-22 DIAGNOSIS — I10 Essential (primary) hypertension: Secondary | ICD-10-CM

## 2012-05-22 DIAGNOSIS — K5900 Constipation, unspecified: Secondary | ICD-10-CM

## 2012-05-22 DIAGNOSIS — R109 Unspecified abdominal pain: Secondary | ICD-10-CM | POA: Insufficient documentation

## 2012-05-22 DIAGNOSIS — R079 Chest pain, unspecified: Secondary | ICD-10-CM

## 2012-05-22 MED ORDER — HYDRALAZINE HCL 25 MG PO TABS: 25.0000 mg | ORAL_TABLET | Freq: Three times a day (TID) | ORAL | Status: DC

## 2012-05-22 MED ORDER — METHYLCELLULOSE (LAXATIVE) PO PACK: 1.0000 | PACK | ORAL | Status: DC | PRN

## 2012-05-22 NOTE — Assessment & Plan Note (Signed)
BP Readings from Last 3 Encounters:  05/22/12 178/77  04/11/12 165/84  01/31/12 144/74    Lab Results  Component Value Date   NA 142 12/20/2011   K 3.7 12/20/2011   CREATININE 1.26 12/20/2011    Assessment:  Blood pressure control: moderately elevated  Progress toward BP goal:  deteriorated  Comments:   Plan:  Medications:  will add low dose of hydrolazine 25 mg tid  Educational resources provided: brochure  Self management tools provided: home blood pressure logbook  Other plans: Patient's blood pressure is not well controlled. His blood pressure has been having taking the past several months. Will add low dose of hydralazine 25 mg 3 times a day and followup.

## 2012-05-22 NOTE — Patient Instructions (Signed)
1. Please start taking Methlycelluilose for your "gas".  2. Please start taking hydralazine 25 mg three times a day for your blood pressure from now. 3. If you have worsening of your symptoms or new symptoms arise, please call the clinic ([phone]), or go to the ER immediately if symptoms are severe.  You have done great job in taking all your medications. I appreciate it very much. Please continue doing that.

## 2012-05-22 NOTE — Progress Notes (Signed)
Patient ID: Nicholas [name], male   DOB: 02/20/1935, 78 y.o.   MRN: [id number] Subjective:   Patient ID: Nicholas [name] male   DOB: [birth year] 78 y.o.   MRN: [id number]  CC:   Acute visit for "gas pain" for more than 3 weeks.   HPI:  [name] is a 78 y.o. man with past medical history as outlined below, who presents for a followup visit.      1.) left flank pain: patient reports he has been having "gas pains" on the left lower chest below the rib cage. He said that he passes a lot of gas recently and little bit of constipated. His pain improves after passing gas. His pain is intermittent. It happens 2 or 3 times a week, each time it lasts about 15-20 minutes, mostly in the night. She denies chest pain or palpitation. Patient has mild shortness of breath which is at his baseline. His pain is not aggravated by deep breath or exertion. There is no nausea, vomiting, diarrhea. Patient did not have recent upper respiratory viral infection symptoms.  2.) HTN: Patient's blood pressure is not well controlled. Today blood pressure is 178/77. He is taking amlodipine 10 mg, Coreg 25 mg daily, enalapril 20 mg daily, Lasix 20 mg daily.    Past Medical History  Diagnosis Date  . Gout     Lt Toe  . GERD (gastroesophageal reflux disease)   . Hypertension   . Blind    Current Outpatient Prescriptions  Medication Sig Dispense Refill  . albuterol (PROVENTIL HFA;VENTOLIN HFA) 108 (90 BASE) MCG/ACT inhaler Inhale 2 puffs into the lungs every 6 (six) hours as needed for wheezing.  1 Inhaler  6  . amLODipine (NORVASC) 10 MG tablet Take 10 mg by mouth daily.      Marland Kitchen aspirin EC 81 MG tablet Take 81 mg by mouth daily.      . carvedilol (COREG) 25 MG tablet Take 25 mg by mouth 2 (two) times daily with a meal.      . diclofenac sodium (VOLTAREN) 1 % GEL Apply 1 application topically 3 (three) times daily as needed. For joint pain.  200 g  6  . enalapril (VASOTEC) 20 MG tablet Take 1 tablet (20 mg total) by  mouth daily.  30 tablet  11  . Fluticasone-Salmeterol (ADVAIR DISKUS) 100-50 MCG/DOSE AEPB Inhale 1 puff into the lungs 2 (two) times daily.  1 each  30  . furosemide (LASIX) 20 MG tablet Take 1 tablet (20 mg total) by mouth daily.  30 tablet  11  . gabapentin (NEURONTIN) 600 MG tablet Take 1 tablet (600 mg total) by mouth 2 (two) times daily.  60 tablet  11  . HYDROcodone-acetaminophen (NORCO) 10-325 MG per tablet Take 1 tablet by mouth every 6 (six) hours as needed for pain.  120 tablet  5  . pantoprazole (PROTONIX) 20 MG tablet Take 1 tablet (20 mg total) by mouth daily.  30 tablet  11  . rosuvastatin (CRESTOR) 10 MG tablet Take 1 tablet (10 mg total) by mouth daily.  30 tablet  11  . Tadalafil (CIALIS) 2.5 MG TABS Take 1 tablet (2.5 mg total) by mouth daily as needed for erectile dysfunction.  30 tablet  1   No family history on file. History   Social History  . Marital Status: Divorced    Spouse Name: N/A    Number of Children: N/A  . Years of Education: N/A   Social History Main Topics  .  Smoking status: Former Games developer  . Smokeless tobacco: None  . Alcohol Use: No  . Drug Use: None  . Sexually Active: None   Other Topics Concern  . None   Social History Narrative  . None    Review of Systems: As per HPI.  Objective:  Physical Exam: Filed Vitals:   05/22/12 0917  BP: 178/77  Pulse: 77  Temp: 97.2 F (36.2 C)  Height: 5\' 11"  (1.803 m)  Weight: 253 lb 11.2 oz (115.078 kg)  SpO2: 97%   General: Not in acute distress HEENT: PERRL, EOMI, no scleral icterus, No bruit or JVD Cardiac: S1/S2, RRR, No murmurs, gallops or rubs Pulm: Good air movement bilaterally, Clear to auscultation bilaterally, No rales, wheezing, rhonchi or rubs. Abd: Soft,  nondistended, nontender, no rebound pain, no organomegaly, BS present Ext: No rashes or edema, 2+DP/PT pulse bilaterally Musculoskeletal: No joint deformities, erythema, or stiffness, ROM full and nontender Skin: no rashes. No  skin bruise. Neuro: Alert and oriented X3, cranial nerves II-XII grossly intact, muscle strength 5/5 in all extremeties,  sensation to light touch intact. Brachial reflexes 1+ bilaterally. Knee reflexes 2+ bilaterally. Babinski's sign negative. Psych: patient is not psychotic, no suicidal or hemocidal ideation.    Assessment & Plan:

## 2012-05-22 NOTE — Assessment & Plan Note (Signed)
Etiology is not clear. The important differential diagnoses include, but unlikely, pulmonary embolism (patient is not pleuritic, patient is not tachycardia, oxygen saturation at 97% on room air) and ACS (patient's chest pain is not exertional), pancreatitis (no nausea, vomiting, or abdominal pain). Currently patient's vital signs are all stable. Physical examination is completely normal. Patient symptoms is likely related to constipation from narcotic usage.   -will treat with methylcellulose and follow up.

## 2012-05-29 ENCOUNTER — Other Ambulatory Visit: Payer: Self-pay | Admitting: Internal Medicine

## 2012-05-29 DIAGNOSIS — H548 Legal blindness, as defined in USA: Secondary | ICD-10-CM

## 2012-06-05 ENCOUNTER — Encounter: Payer: PRIVATE HEALTH INSURANCE | Admitting: Internal Medicine

## 2012-06-05 ENCOUNTER — Telehealth: Payer: Self-pay | Admitting: *Deleted

## 2012-06-05 NOTE — Telephone Encounter (Signed)
Pt calls and request a letter for [employer] housing authority stating that due to his blindness, age and illnesses that he needs someone to live with him to assist in his care and everyday needs. States he had spoken to dr brown about this need. Would like to be called when letter is complete and someone will pick it up. His ph# is on snapshot

## 2012-06-05 NOTE — Telephone Encounter (Signed)
The patient called me last week about the order to renew his [facility] aide.  I put it in as an order in Epic, but didn't realize he needed a letter to the housing authority.  I've now written and printed that letter, and it is at the front desk.  Please call the patient and let him know he can pick it up anytime.

## 2012-06-14 NOTE — Assessment & Plan Note (Signed)
He has significant peripheral edema.  I encouraged him to take his medications for swelling every day and we will recheck him in two weeks.

## 2012-06-14 NOTE — Assessment & Plan Note (Addendum)
We discussed today that his echocardiogram showed that he has problems with diastolic dysfunction that is likely secondary to many years of poorly controlled blood pressure.  We discussed that tighter blood pressure control and control of his salt and fluid intake will help him feel better and be able to do more.  His lower extremity swelling and fatigue with walking are likely secondary to [facility].  He was encouraged to take the lasix EVERY day and to come back for a recheck of his blood pressure in 2 weeks.

## 2012-06-14 NOTE — Assessment & Plan Note (Signed)
He continues to have some crackles on exam today.  He did the breathing tests yesterday and we do not have the results of that yet.  I encouraged him to take his Advair twice daily scheduled to keep him feeling well.  We will hold off on the oxygen at this time as he feels that he doesn't need it and his saturations are >90% today in the office.

## 2012-06-14 NOTE — Assessment & Plan Note (Signed)
01/17/2012:  He did not bring his medication with him today and doesn't know the names of his medications. We discussed that tight blood pressure control is the key to helping decrease his swelling and to help slow the progression of his heart failure.  I encouraged him to take his medications every day and to come back in two weeks to recheck his blood pressure.

## 2012-06-26 ENCOUNTER — Ambulatory Visit (INDEPENDENT_AMBULATORY_CARE_PROVIDER_SITE_OTHER): Payer: PRIVATE HEALTH INSURANCE | Admitting: Internal Medicine

## 2012-06-26 ENCOUNTER — Encounter: Payer: Self-pay | Admitting: Internal Medicine

## 2012-06-26 VITALS — BP 149/74 | HR 64 | Temp 98.2°F | Ht 71.0 in | Wt 258.7 lb

## 2012-06-26 DIAGNOSIS — E785 Hyperlipidemia, unspecified: Secondary | ICD-10-CM

## 2012-06-26 DIAGNOSIS — R221 Localized swelling, mass and lump, neck: Secondary | ICD-10-CM

## 2012-06-26 DIAGNOSIS — I10 Essential (primary) hypertension: Secondary | ICD-10-CM

## 2012-06-26 DIAGNOSIS — R109 Unspecified abdominal pain: Secondary | ICD-10-CM

## 2012-06-26 LAB — LIPID PANEL
Cholesterol: 130 mg/dL (ref 0–200)
LDL Cholesterol: 68 mg/dL (ref 0–99)
Triglycerides: 119 mg/dL (ref <150)

## 2012-06-26 MED ORDER — DOCUSATE SODIUM 250 MG PO CAPS: 250.0000 mg | ORAL_CAPSULE | Freq: Every day | ORAL | Status: DC

## 2012-06-26 MED ORDER — ENALAPRIL MALEATE 20 MG PO TABS: 40.0000 mg | ORAL_TABLET | Freq: Every day | ORAL | Status: DC

## 2012-06-26 NOTE — Patient Instructions (Signed)
General Instructions: Your blood pressure was elevated today.  Since it has been difficult to take Hydralazine 3 times per day, we will discontinue this medication.  Instead, we are increasing your Enalapril medication to 40 mg once per day.  For your constipation, start taking Docusate (also called Colace), 1 tablet once per day for 1 month.  Please return for a follow-up visit in 6 months.   Treatment Goals:  Goals (1 Years of Data) as of 06/26/12   None      Progress Toward Treatment Goals:  Treatment Goal 05/22/2012  Blood pressure deteriorated    Self Care Goals & Plans:  Self Care Goal 06/26/2012  Manage my medications take my medicines as prescribed; bring my medications to every visit; refill my medications on time  Eat healthy foods eat fruit for snacks and desserts; eat foods that are low in salt; eat smaller portions  Be physically active (No Data)       Care Management & Community Referrals:

## 2012-06-26 NOTE — Assessment & Plan Note (Signed)
-  recheck lipid panel today

## 2012-06-26 NOTE — Assessment & Plan Note (Signed)
BP Readings from Last 3 Encounters:  06/26/12 149/74  05/22/12 178/77  04/11/12 165/84    Lab Results  Component Value Date   NA 142 12/20/2011   K 3.7 12/20/2011   CREATININE 1.26 12/20/2011    Assessment:  Blood pressure control: mildly elevated  Progress toward BP goal:  deteriorated  Comments: BP still elevated, patient non-compliant with TID hydralazine.  Blindness contributes to non-compliance  Plan:  Medications:  discontinue hydralazine.  Increase enalapril to 40 mg daily  Educational resources provided:    Self management tools provided:    Other plans: recheck BMET at next visit

## 2012-06-26 NOTE — Assessment & Plan Note (Signed)
The patient continues to note abdominal pain, similar to prior visit (see note 05/22/12).  He continues to note constipation, and has not tried any treatment for constipation. -patient prescribed docusate, 1 tablet daily.  Strongly encouraged compliance

## 2012-06-26 NOTE — Assessment & Plan Note (Signed)
The patient had a midline neck swelling in 2012 of unknown etiology.  He saw general surgery for this, who ordered a CT of his neck for evaluation.  The patient never presented for this CT scan.  I re-addressed the issue today, and the patient notes that the area of swelling had resolved > 1 year ago, which is why he never presented for the scan.  Since the purpose of the scan was to rule out malignancy, and the area spontaneously resolved, malignancy is exceedingly unlikely, so I will cancel this test today.

## 2012-06-26 NOTE — Progress Notes (Signed)
HPI The patient is a 78 y.o. male with a history of HTN, HL, blindness, COPD, chronic diastolic heart failure, chronic pain, presenting for a follow-up visit.  The patient continues to note "gas pains", as described at his last OV and his last telephone encounter.  These were thought to be attributed to constipation, and the patient continues to note constipation.  He has not taken methycellulose or fiber supplement as prescribed at his last visit and telephone encounter.  He occasionally drinks prune juice for this issue.  At the patient's last visit, he was started on hydralazine TID for elevated BP.  He states that he is only somewhat complaint, taking an average of 1 tab/day.  ROS: General: no fevers, chills, changes in weight, changes in appetite Skin: no rash HEENT: no blurry vision, hearing changes, sore throat Pulm: no dyspnea, coughing, wheezing CV: no chest pain, palpitations, shortness of breath Abd: no abdominal pain, nausea/vomiting, diarrhea/constipation GU: no dysuria, hematuria, polyuria Ext: no arthralgias, myalgias Neuro: no weakness, numbness, or tingling  Filed Vitals:   06/26/12 1437  BP: 149/74  Pulse: 64  Temp: 98.2 F (36.8 C)    PEX General: alert, cooperative, and in no apparent distress HEENT: pupils equal round and reactive to light, vision grossly intact, oropharynx clear and non-erythematous  Neck: supple, no lymphadenopathy Lungs: clear to ascultation bilaterally, normal work of respiration, no wheezes, rales, ronchi Heart: regular rate and rhythm, no murmurs, gallops, or rubs Abdomen: soft, non-tender, non-distended, mild tympany to percussion, normal bowel sounds Extremities: no cyanosis, clubbing, or edema Neurologic: alert & oriented X3, cranial nerves II-XII intact, strength grossly intact, sensation intact to light touch  Current Outpatient Prescriptions on File Prior to Visit  Medication Sig Dispense Refill  . albuterol (PROVENTIL  HFA;VENTOLIN HFA) 108 (90 BASE) MCG/ACT inhaler Inhale 2 puffs into the lungs every 6 (six) hours as needed for wheezing.  1 Inhaler  6  . amLODipine (NORVASC) 10 MG tablet Take 10 mg by mouth daily.      Marland Kitchen aspirin EC 81 MG tablet Take 81 mg by mouth daily.      . carvedilol (COREG) 25 MG tablet Take 25 mg by mouth 2 (two) times daily with a meal.      . diclofenac sodium (VOLTAREN) 1 % GEL Apply 1 application topically 3 (three) times daily as needed. For joint pain.  200 g  6  . enalapril (VASOTEC) 20 MG tablet Take 1 tablet (20 mg total) by mouth daily.  30 tablet  11  . Fluticasone-Salmeterol (ADVAIR DISKUS) 100-50 MCG/DOSE AEPB Inhale 1 puff into the lungs 2 (two) times daily.  1 each  30  . furosemide (LASIX) 20 MG tablet Take 1 tablet (20 mg total) by mouth daily.  30 tablet  11  . gabapentin (NEURONTIN) 600 MG tablet Take 1 tablet (600 mg total) by mouth 2 (two) times daily.  60 tablet  11  . hydrALAZINE (APRESOLINE) 25 MG tablet Take 1 tablet (25 mg total) by mouth 3 (three) times daily.  90 tablet  3  . HYDROcodone-acetaminophen (NORCO) 10-325 MG per tablet Take 1 tablet by mouth every 6 (six) hours as needed for pain.  120 tablet  5  . methylcellulose packet Take 1 each by mouth as needed for constipation.  50 each  5  . pantoprazole (PROTONIX) 20 MG tablet Take 1 tablet (20 mg total) by mouth daily.  30 tablet  11  . rosuvastatin (CRESTOR) 10 MG tablet Take 1 tablet (  10 mg total) by mouth daily.  30 tablet  11  . Tadalafil (CIALIS) 2.5 MG TABS Take 1 tablet (2.5 mg total) by mouth daily as needed for erectile dysfunction.  30 tablet  1   No current facility-administered medications on file prior to visit.    Assessment/Plan

## 2012-08-17 ENCOUNTER — Other Ambulatory Visit: Payer: Self-pay | Admitting: Internal Medicine

## 2012-08-25 ENCOUNTER — Other Ambulatory Visit: Payer: Self-pay | Admitting: Internal Medicine

## 2012-08-28 ENCOUNTER — Telehealth: Payer: Self-pay | Admitting: *Deleted

## 2012-08-28 MED ORDER — NAPROXEN 500 MG PO TABS: 500.0000 mg | ORAL_TABLET | Freq: Every day | ORAL | Status: DC | PRN

## 2012-08-28 NOTE — Telephone Encounter (Signed)
Patient is already taking gabapentin, voltaren gel, and hydrocodone-acetaminophen 10-325.  We can try a short term course of naproxen.  Sent electronically to pharmacy.

## 2012-08-28 NOTE — Telephone Encounter (Signed)
Pt called asking for pain med for arthritis, c/o pain to hip area on rt side.  Onset of pain 2 days ago.   Using alcohol and cream without relief.  No know injury to this area, he has pain on and off a few times a year. Increase pain with movement. Pt # [id number] [facility] on [address]

## 2012-08-29 ENCOUNTER — Other Ambulatory Visit: Payer: Self-pay | Admitting: Internal Medicine

## 2012-08-29 NOTE — Telephone Encounter (Signed)
Pt received Rx and feels much better.

## 2012-09-03 ENCOUNTER — Other Ambulatory Visit: Payer: Self-pay | Admitting: Internal Medicine

## 2012-09-13 ENCOUNTER — Other Ambulatory Visit: Payer: Self-pay | Admitting: Internal Medicine

## 2012-10-04 ENCOUNTER — Other Ambulatory Visit: Payer: Self-pay | Admitting: *Deleted

## 2012-10-04 MED ORDER — HYDROCODONE-ACETAMINOPHEN 10-325 MG PO TABS: 1.0000 | ORAL_TABLET | Freq: Four times a day (QID) | ORAL | Status: DC | PRN

## 2012-10-04 NOTE — Telephone Encounter (Signed)
Rx called in 

## 2012-10-04 NOTE — Telephone Encounter (Signed)
Pt has appointment with Dr Manson Passey 6/5

## 2012-10-04 NOTE — Telephone Encounter (Signed)
Please phone in to pt pharmacy

## 2012-10-04 NOTE — Telephone Encounter (Signed)
Pt is out of med

## 2012-10-05 ENCOUNTER — Telehealth: Payer: Self-pay | Admitting: Internal Medicine

## 2012-10-05 NOTE — Telephone Encounter (Signed)
Called by pharmacy that hydrocodone rx was called in without the last part of Dr. Theora Gianotti DEA # and unable to bill insurance. Looked and verified that Dr. Manson Passey did refill this rx on Friday and provided my name and DEA # for the refill.   Hydrocodone # 120 (10/325) refills 5.   Will route this to his PCP for their information.  Genella Mech 9:44 AM 10/05/2012

## 2012-10-10 ENCOUNTER — Ambulatory Visit (INDEPENDENT_AMBULATORY_CARE_PROVIDER_SITE_OTHER): Payer: PRIVATE HEALTH INSURANCE | Admitting: Internal Medicine

## 2012-10-10 ENCOUNTER — Encounter: Payer: Self-pay | Admitting: Internal Medicine

## 2012-10-10 VITALS — BP 155/77 | HR 63 | Temp 98.6°F | Ht 71.0 in | Wt 248.2 lb

## 2012-10-10 DIAGNOSIS — I10 Essential (primary) hypertension: Secondary | ICD-10-CM

## 2012-10-10 DIAGNOSIS — Z0000 Encounter for general adult medical examination without abnormal findings: Secondary | ICD-10-CM

## 2012-10-10 DIAGNOSIS — R600 Localized edema: Secondary | ICD-10-CM

## 2012-10-10 DIAGNOSIS — R609 Edema, unspecified: Secondary | ICD-10-CM

## 2012-10-10 DIAGNOSIS — M48061 Spinal stenosis, lumbar region without neurogenic claudication: Secondary | ICD-10-CM

## 2012-10-10 DIAGNOSIS — J449 Chronic obstructive pulmonary disease, unspecified: Secondary | ICD-10-CM

## 2012-10-10 MED ORDER — NAPROXEN 500 MG PO TABS: 500.0000 mg | ORAL_TABLET | Freq: Every day | ORAL | Status: DC | PRN

## 2012-10-10 MED ORDER — PANTOPRAZOLE SODIUM 20 MG PO TBEC: 20.0000 mg | DELAYED_RELEASE_TABLET | Freq: Every day | ORAL | Status: DC

## 2012-10-10 MED ORDER — FUROSEMIDE 20 MG PO TABS: 20.0000 mg | ORAL_TABLET | Freq: Every day | ORAL | Status: DC

## 2012-10-10 MED ORDER — ASPIRIN EC 81 MG PO TBEC: 81.0000 mg | DELAYED_RELEASE_TABLET | Freq: Every day | ORAL | Status: DC

## 2012-10-10 MED ORDER — FLUTICASONE-SALMETEROL 100-50 MCG/DOSE IN AEPB: 1.0000 | INHALATION_SPRAY | Freq: Two times a day (BID) | RESPIRATORY_TRACT | Status: DC

## 2012-10-10 NOTE — Assessment & Plan Note (Signed)
BP Readings from Last 3 Encounters:  10/10/12 155/77  06/26/12 149/74  05/22/12 178/77    Lab Results  Component Value Date   NA 142 12/20/2011   K 3.7 12/20/2011   CREATININE 1.26 12/20/2011    Assessment: Blood pressure control: mildly elevated Progress toward BP goal:  unchanged Comments: The patient's BP is mildly elevated, likely due to missing some doses of his BP medications this morning  Plan: Medications:  continue current medications Educational resources provided:   Self management tools provided:   Other plans: Recheck BP at next visit

## 2012-10-10 NOTE — Assessment & Plan Note (Addendum)
The patient's lung disease is well-controlled.  The patient uses his albuterol inhaler about once/week. -continue advair, albuterol

## 2012-10-10 NOTE — Patient Instructions (Signed)
General Instructions: Your pain is due to a condition called Spinal Stenosis.  We can continue to manage this pain with pain medications, though this is likely a life-long process.  Your blood pressure was elevated today, which I think is due to running out of some of your blood pressure medication.  We have sent in refills to the pharmacy.  Please return for a follow-up visit in 6 months.   Treatment Goals:  Goals (1 Years of Data) as of 10/10/12   None      Progress Toward Treatment Goals:  Treatment Goal 10/10/2012  Blood pressure unchanged    Self Care Goals & Plans:  Self Care Goal 10/10/2012  Manage my medications refill my medications on time; take my medicines as prescribed; bring my medications to every visit  Monitor my health (No Data)  Eat healthy foods eat foods that are low in salt  Be physically active take a walk every day       Care Management & Community Referrals:  Referral 10/10/2012  Referrals made for care management support none needed

## 2012-10-10 NOTE — Progress Notes (Signed)
HPI The patient is a 78 y.o. male with a history of blindness, COPD, HTN, spinal stenosis, presenting for a follow-up visit.  The patient has a history of HTN.  At his last visit, we stopped hydralazine due to non-compliance, and increased enalapril to 40.  BP remains elevated today, though he notes not taking his amlodipine or enalapril this morning, and running out of his lasix.  The patient notes continued right lower back pain, radiating to his right hip, consistent with spinal stenosis.  Pain is fairly well managed with medications.  We spent some time discussing the long-term course of this condition.  ROS: General: no fevers, chills, changes in weight, changes in appetite Skin: no rash HEENT: no blurry vision, hearing changes, sore throat Pulm: no dyspnea, coughing, wheezing CV: no chest pain, palpitations, shortness of breath Abd: no abdominal pain, nausea/vomiting, diarrhea/constipation GU: no dysuria, hematuria, polyuria Ext: no arthralgias, myalgias Neuro: no weakness, numbness, or tingling  Filed Vitals:   10/10/12 0954  BP: 155/77  Pulse: 63  Temp: 98.6 F (37 C)    PEX General: alert, cooperative, and in no apparent distress HEENT: pupils equal round and reactive to light, vision grossly intact, oropharynx clear and non-erythematous  Neck: supple, no lymphadenopathy Lungs: clear to ascultation bilaterally, normal work of respiration, no wheezes, rales, ronchi Heart: regular rate and rhythm, no murmurs, gallops, or rubs Abdomen: soft, non-tender, non-distended, normal bowel sounds Back: tenderness to palpation at the right lower back Extremities: no cyanosis, clubbing, or edema Neurologic: alert & oriented X3, cranial nerves II-XII intact, strength grossly intact, sensation intact to light touch  Current Outpatient Prescriptions on File Prior to Visit  Medication Sig Dispense Refill  . albuterol (PROVENTIL HFA;VENTOLIN HFA) 108 (90 BASE) MCG/ACT inhaler Inhale 2  puffs into the lungs every 6 (six) hours as needed for wheezing.  1 Inhaler  6  . amLODipine (NORVASC) 10 MG tablet TAKE 1 TABLET (10 MG TOTAL) BY MOUTH DAILY.  30 tablet  11  . aspirin EC 81 MG tablet Take 81 mg by mouth daily.      . carvedilol (COREG) 25 MG tablet TAKE 1 TABLET (25 MG TOTAL) BY MOUTH 2 (TWO) TIMES DAILY.  60 tablet  11  . enalapril (VASOTEC) 20 MG tablet Take 2 tablets (40 mg total) by mouth daily.  60 tablet  11  . Fluticasone-Salmeterol (ADVAIR DISKUS) 100-50 MCG/DOSE AEPB Inhale 1 puff into the lungs 2 (two) times daily.  1 each  30  . furosemide (LASIX) 20 MG tablet Take 1 tablet (20 mg total) by mouth daily.  30 tablet  11  . gabapentin (NEURONTIN) 600 MG tablet Take 1 tablet (600 mg total) by mouth 2 (two) times daily.  60 tablet  11  . HYDROcodone-acetaminophen (NORCO) 10-325 MG per tablet Take 1 tablet by mouth every 6 (six) hours as needed for pain.  120 tablet  5  . methylcellulose packet Take 1 each by mouth as needed for constipation.  50 each  5  . naproxen (NAPROSYN) 500 MG tablet Take 1 tablet (500 mg total) by mouth daily as needed (arthritis pain).  30 tablet  0  . pantoprazole (PROTONIX) 20 MG tablet Take 1 tablet (20 mg total) by mouth daily.  30 tablet  11  . rosuvastatin (CRESTOR) 10 MG tablet Take 1 tablet (10 mg total) by mouth daily.  30 tablet  11  . STOOL SOFTENER 250 MG capsule TAKE 1 CAPSULE (250 MG TOTAL) BY MOUTH DAILY.  30 capsule  6  . Tadalafil (CIALIS) 2.5 MG TABS Take 1 tablet (2.5 mg total) by mouth daily as needed for erectile dysfunction.  30 tablet  1  . VOLTAREN 1 % GEL APPLY 1 APPLICATION TOPICALLY 3 (THREE) TIMES DAILY AS NEEDED FOR JOINT PAIN.  200 g  6   No current facility-administered medications on file prior to visit.    Assessment/Plan

## 2012-10-10 NOTE — Assessment & Plan Note (Signed)
The patient has a history of spinal stenosis.  Pain is fairly well managed with a combination of hydrocodone-acetaminophen, gabapentin, and occasional naproxen. -continue current medications

## 2012-10-11 NOTE — Progress Notes (Signed)
TEACHING ATTENDING ADDENDUM: I discussed this case with Dr. Brown at the time of the patient visit. I agree with the HPI, exam findings and have read the documentation provided by the resident,  and I concur with the plan of care. Please see the resident note for details of management.   

## 2012-10-22 ENCOUNTER — Encounter (HOSPITAL_COMMUNITY): Payer: Self-pay | Admitting: Emergency Medicine

## 2012-10-22 ENCOUNTER — Emergency Department (INDEPENDENT_AMBULATORY_CARE_PROVIDER_SITE_OTHER)
Admission: EM | Admit: 2012-10-22 | Discharge: 2012-10-22 | Disposition: A | Discharge status: Home / Self Care | Payer: PRIVATE HEALTH INSURANCE | Source: Home / Self Care

## 2012-10-22 DIAGNOSIS — I8310 Varicose veins of unspecified lower extremity with inflammation: Secondary | ICD-10-CM

## 2012-10-22 DIAGNOSIS — I8311 Varicose veins of right lower extremity with inflammation: Secondary | ICD-10-CM

## 2012-10-22 MED ORDER — SILVER SULFADIAZINE 1 % EX CREA
TOPICAL_CREAM | Freq: Once | CUTANEOUS | Status: AC
  Administered 2012-10-22: 13:00:00 via TOPICAL

## 2012-10-22 MED ORDER — CEPHALEXIN 500 MG PO CAPS: 500.0000 mg | ORAL_CAPSULE | Freq: Four times a day (QID) | ORAL | Status: DC

## 2012-10-22 MED ORDER — BACITRACIN 500 UNIT/GM EX OINT: 1.0000 "application " | TOPICAL_OINTMENT | Freq: Two times a day (BID) | CUTANEOUS | Status: DC

## 2012-10-22 NOTE — ED Notes (Signed)
Spoke to ortho, coming 

## 2012-10-22 NOTE — ED Notes (Signed)
Paged ortho tech 

## 2012-10-22 NOTE — Progress Notes (Signed)
Orthopedic Tech Progress Note Patient Details:  Nicholas [name] 1934/08/10 [id number] Application of unna boot requested by urgent care staff. Nurse applied skin cream to affected areas of Right LE before unna boot was applied. Unna boot applied to Right LE. Patient was asked if wrapping felt too tight or was uncomfortable and replied, "no." Unna boot wrapped with coflex and sealed off with paper tape to hold coflex. Application explained and care directions including not to remove dressing and keeping dressing dry until seen for a follow up appointment. Patient advised to make an appointment to follow up with primary doctor or come back to urgent care center in 3-4 days for reevaluation and treatment. Family present with patient.  Ortho Devices Type of Ortho Device: Radio broadcast assistant Ortho Device/Splint Location: Right LE Ortho Device/Splint Interventions: Application   Asia R Thompson 10/22/2012, 1:18 PM

## 2012-10-22 NOTE — ED Notes (Signed)
Left leg irritation, onset one week ago

## 2012-10-22 NOTE — ED Notes (Signed)
Discharge pending application of dressing

## 2012-10-22 NOTE — ED Provider Notes (Signed)
History     CSN: [id number]  Arrival date & time 10/22/12  1032   First MD Initiated Contact with Patient 10/22/12 1136      Chief Complaint  Patient presents with  . Leg Pain    (Consider location/radiation/quality/duration/timing/severity/associated sxs/prior treatment) HPI Comments: 68 her oh male presents with draining sores to the right foot and lower aspect of the right leg. He states these began about 2 weeks ago. He started as small papules that were mildly pruritic and he would scratch them. As he scratched them they became open sores. They have been draining primarily a honey-colored liquid at which a portion of it has contributed to scabbing.he denies pain. Denies fever, chills or other constitutional symptoms. He states his only medical problems are arthritis    Past Medical History  Diagnosis Date  . Gout     Lt Toe  . GERD (gastroesophageal reflux disease)   . Hypertension   . Blind     Past Surgical History  Procedure Laterality Date  . Colon surgery      No family history on file.  History  Substance Use Topics  . Smoking status: Former Games developer  . Smokeless tobacco: Not on file  . Alcohol Use: No      Review of Systems  Constitutional: Negative.   Respiratory: Negative.   Cardiovascular: Negative.   Gastrointestinal: Negative.   Skin: Positive for rash.  Hematological: Negative.     Allergies  Review of patient's allergies indicates no known allergies.  Home Medications   Current Outpatient Rx  Name  Route  Sig  Dispense  Refill  . albuterol (PROVENTIL HFA;VENTOLIN HFA) 108 (90 BASE) MCG/ACT inhaler   Inhalation   Inhale 2 puffs into the lungs every 6 (six) hours as needed for wheezing.   1 Inhaler   6   . amLODipine (NORVASC) 10 MG tablet      TAKE 1 TABLET (10 MG TOTAL) BY MOUTH DAILY.   30 tablet   11   . aspirin EC 81 MG tablet   Oral   Take 1 tablet (81 mg total) by mouth daily.   150 tablet   2   . bacitracin 500  UNIT/GM ointment   Topical   Apply 1 application topically 2 (two) times daily.   453 g   0   . carvedilol (COREG) 25 MG tablet      TAKE 1 TABLET (25 MG TOTAL) BY MOUTH 2 (TWO) TIMES DAILY.   60 tablet   11   . cephALEXin (KEFLEX) 500 MG capsule   Oral   Take 1 capsule (500 mg total) by mouth 4 (four) times daily.   28 capsule   0   . enalapril (VASOTEC) 20 MG tablet   Oral   Take 2 tablets (40 mg total) by mouth daily.   60 tablet   11   . Fluticasone-Salmeterol (ADVAIR DISKUS) 100-50 MCG/DOSE AEPB   Inhalation   Inhale 1 puff into the lungs 2 (two) times daily.   30 each   11   . furosemide (LASIX) 20 MG tablet   Oral   Take 1 tablet (20 mg total) by mouth daily.   30 tablet   11   . gabapentin (NEURONTIN) 600 MG tablet   Oral   Take 1 tablet (600 mg total) by mouth 2 (two) times daily.   60 tablet   11   . HYDROcodone-acetaminophen (NORCO) 10-325 MG per tablet   Oral  Take 1 tablet by mouth every 6 (six) hours as needed for pain.   120 tablet   5   . methylcellulose packet   Oral   Take 1 each by mouth as needed for constipation.   50 each   5   . naproxen (NAPROSYN) 500 MG tablet   Oral   Take 1 tablet (500 mg total) by mouth daily as needed (arthritis pain).   30 tablet   0   . pantoprazole (PROTONIX) 20 MG tablet   Oral   Take 1 tablet (20 mg total) by mouth daily.   30 tablet   11   . rosuvastatin (CRESTOR) 10 MG tablet   Oral   Take 1 tablet (10 mg total) by mouth daily.   30 tablet   11   . STOOL SOFTENER 250 MG capsule      TAKE 1 CAPSULE (250 MG TOTAL) BY MOUTH DAILY.   30 capsule   6   . Tadalafil (CIALIS) 2.5 MG TABS   Oral   Take 1 tablet (2.5 mg total) by mouth daily as needed for erectile dysfunction.   30 tablet   1   . VOLTAREN 1 % GEL      APPLY 1 APPLICATION TOPICALLY 3 (THREE) TIMES DAILY AS NEEDED FOR JOINT PAIN.   200 g   6     BP 138/59  Pulse 59  Temp(Src) 98.7 F (37.1 C) (Oral)  Resp 18  SpO2  96%  Physical Exam  Nursing note and vitals reviewed. Constitutional: He is oriented to person, place, and time. He appears well-developed and well-nourished. No distress.  Eyes:  Bilateral blindness.  Neck: Neck supple.  Cardiovascular: Normal rate.   Pulmonary/Chest: Effort normal.  Musculoskeletal:  2+ pitting edema to the right foot 1+ pitting edema to the right ankle. No tenderness.  Neurological: He is alert and oriented to person, place, and time. He exhibits normal muscle tone.  Skin: Skin is warm and dry. Rash noted. There is erythema.  There is mild scattered erythema to the papules, crusting and open wounds of the lower aspect of the right lower leg to include portions of the ankle and lesser to the foot. No lymphangitis.  Psychiatric: He has a normal mood and affect.    ED Course  Procedures (including critical care time)  Labs Reviewed - No data to display No results found.   1. Venous stasis dermatitis, right       MDM  Silvadene cream to the areas of the lesion, erythema and papules. Apply an episode over this area. It is recommended he call your primary care doctor today for an appointment on Thursday or Friday. At that point he may remove the boot and reassessed the lower extremity. If you are not able to obtain an appointment he may remove the boot Friday morning and start applying the bacitracin ointment and cover with calls. Try to obtain an appointment soon as possible early next week. He may need to be referred to a dermatologist. If there is erythema or swelling it extends up toward towards the knee or there is fever or other symptoms seek medical attention promptly. Keflex 500 mg 4 times a day for 7 days.        Hayden Rasmussen, NP 10/22/12 1233

## 2012-10-22 NOTE — ED Provider Notes (Signed)
Medical screening examination/treatment/procedure(s) were performed by resident physician or non-physician practitioner and as supervising physician I was immediately available for consultation/collaboration.   Barkley Bruns MD.   Linna Hoff, MD 10/22/12 [phone]

## 2012-10-26 ENCOUNTER — Encounter (HOSPITAL_COMMUNITY): Payer: Self-pay | Admitting: *Deleted

## 2012-10-26 ENCOUNTER — Emergency Department (HOSPITAL_COMMUNITY)
Admission: EM | Admit: 2012-10-26 | Discharge: 2012-10-26 | Disposition: A | Discharge status: Home / Self Care | Payer: PRIVATE HEALTH INSURANCE | Source: Home / Self Care

## 2012-10-26 DIAGNOSIS — I8311 Varicose veins of right lower extremity with inflammation: Secondary | ICD-10-CM

## 2012-10-26 DIAGNOSIS — L282 Other prurigo: Secondary | ICD-10-CM

## 2012-10-26 HISTORY — DX: Venous insufficiency (chronic) (peripheral): I87.2

## 2012-10-26 HISTORY — DX: Hyperlipidemia, unspecified: E78.5

## 2012-10-26 MED ORDER — HYDROXYZINE HCL 25 MG PO TABS: 25.0000 mg | ORAL_TABLET | Freq: Four times a day (QID) | ORAL | Status: DC | PRN

## 2012-10-26 MED ORDER — BACITRACIN 500 UNIT/GM EX OINT
1.0000 "application " | TOPICAL_OINTMENT | Freq: Once | CUTANEOUS | Status: AC
  Administered 2012-10-26: 1 via TOPICAL

## 2012-10-26 MED ORDER — METHYLPREDNISOLONE 4 MG PO KIT: PACK | ORAL | Status: DC

## 2012-10-26 NOTE — ED Notes (Signed)
Presents for re-eval of RLE; has had Elveria Rising in place since 6/17.  States it no longer seems to be draining through dressing; pain has improved some.  Has been taking hydrocodone and Keflex as prescribed.

## 2012-10-26 NOTE — ED Provider Notes (Signed)
History     CSN: [id number]  Arrival date & time 10/26/12  1116   None     Chief Complaint  Patient presents with  . Wound Check    (Consider location/radiation/quality/duration/timing/severity/associated sxs/prior treatment) HPI Comments: Patient returns for followup of venous stasis with dermatitis of the right lower extremity. He had been wearing it in the boot since his visit here 3 days ago. He had been instructed to remove it at home today and apply bacitracin ointment and then rewrap it and followup with his physician on Monday however the persons that applied in the boot told him to come back today. He denies any new symptoms or problems and states his lower extremity feels better. He is compliant with his medications. He has an additional complaint of small fine papules to his upper extremities and a few more to his trunk. He is asking for medications to help with itching.   Past Medical History  Diagnosis Date  . Gout     Lt Toe  . GERD (gastroesophageal reflux disease)   . Hypertension   . Blind   . Hyperlipidemia   . Venous stasis dermatitis     Past Surgical History  Procedure Laterality Date  . Colon surgery      No family history on file.  History  Substance Use Topics  . Smoking status: Former Games developer  . Smokeless tobacco: Not on file  . Alcohol Use: No      Review of Systems  Constitutional: Negative.   HENT: Negative.   Respiratory: Negative.   Cardiovascular: Negative.   Gastrointestinal: Negative.   Skin: Positive for rash.  Neurological: Negative.     Allergies  Review of patient's allergies indicates no known allergies.  Home Medications   Current Outpatient Rx  Name  Route  Sig  Dispense  Refill  . albuterol (PROVENTIL HFA;VENTOLIN HFA) 108 (90 BASE) MCG/ACT inhaler   Inhalation   Inhale 2 puffs into the lungs every 6 (six) hours as needed for wheezing.   1 Inhaler   6   . amLODipine (NORVASC) 10 MG tablet      TAKE 1 TABLET  (10 MG TOTAL) BY MOUTH DAILY.   30 tablet   11   . bacitracin 500 UNIT/GM ointment   Topical   Apply 1 application topically 2 (two) times daily.   453 g   0   . carvedilol (COREG) 25 MG tablet      TAKE 1 TABLET (25 MG TOTAL) BY MOUTH 2 (TWO) TIMES DAILY.   60 tablet   11   . cephALEXin (KEFLEX) 500 MG capsule   Oral   Take 1 capsule (500 mg total) by mouth 4 (four) times daily.   28 capsule   0   . enalapril (VASOTEC) 20 MG tablet   Oral   Take 2 tablets (40 mg total) by mouth daily.   60 tablet   11   . Fluticasone-Salmeterol (ADVAIR DISKUS) 100-50 MCG/DOSE AEPB   Inhalation   Inhale 1 puff into the lungs 2 (two) times daily.   30 each   11   . furosemide (LASIX) 20 MG tablet   Oral   Take 1 tablet (20 mg total) by mouth daily.   30 tablet   11   . gabapentin (NEURONTIN) 600 MG tablet   Oral   Take 1 tablet (600 mg total) by mouth 2 (two) times daily.   60 tablet   11   . HYDROcodone-acetaminophen (NORCO)  10-325 MG per tablet   Oral   Take 1 tablet by mouth every 6 (six) hours as needed for pain.   120 tablet   5   . naproxen (NAPROSYN) 500 MG tablet   Oral   Take 1 tablet (500 mg total) by mouth daily as needed (arthritis pain).   30 tablet   0   . pantoprazole (PROTONIX) 20 MG tablet   Oral   Take 1 tablet (20 mg total) by mouth daily.   30 tablet   11   . rosuvastatin (CRESTOR) 10 MG tablet   Oral   Take 1 tablet (10 mg total) by mouth daily.   30 tablet   11   . aspirin EC 81 MG tablet   Oral   Take 1 tablet (81 mg total) by mouth daily.   150 tablet   2   . methylcellulose packet   Oral   Take 1 each by mouth as needed for constipation.   50 each   5   . STOOL SOFTENER 250 MG capsule      TAKE 1 CAPSULE (250 MG TOTAL) BY MOUTH DAILY.   30 capsule   6   . Tadalafil (CIALIS) 2.5 MG TABS   Oral   Take 1 tablet (2.5 mg total) by mouth daily as needed for erectile dysfunction.   30 tablet   1   . VOLTAREN 1 % GEL       APPLY 1 APPLICATION TOPICALLY 3 (THREE) TIMES DAILY AS NEEDED FOR JOINT PAIN.   200 g   6     BP 121/65  Pulse 61  Temp(Src) 98.2 F (36.8 C) (Oral)  Resp 18  SpO2 94%  Physical Exam  Nursing note and vitals reviewed. Constitutional: He is oriented to person, place, and time. He appears well-developed and well-nourished. No distress.  Neck: Normal range of motion. Neck supple.  Cardiovascular: Normal rate.   Pulmonary/Chest: Effort normal.  Musculoskeletal: Normal range of motion. He exhibits edema.  Neurological: He is alert and oriented to person, place, and time. He exhibits normal muscle tone.  Skin: Skin is warm and dry. Rash noted.  Upon removal of the inability of the right lower extremity skin appears much improved. The lesions have dried. There are no signs of infection. No erythema or lymphangitis. The swelling has also diminished. Much of the areas are covered with residual zinc oxide.    ED Course  Procedures (including critical care time)  Labs Reviewed - No data to display No results found.   1. Venous stasis dermatitis, right   2. Pruritic rash       MDM  Apply thin layer of bacitracin and then a  dry dressing to the foot and lower most right extremity. Followup with your PCP on Monday as scheduled. 3 new symptoms problems or worsening may return. Medrol Dosepak for itching. He may address this problem with his PCP on Monday as well. Vistaril 50 mg 3 times a day when necessary itching.        Hayden Rasmussen, NP 10/26/12 1227  Hayden Rasmussen, NP 10/26/12 1824

## 2012-10-26 NOTE — ED Provider Notes (Signed)
Medical screening examination/treatment/procedure(s) were performed by non-physician practitioner and as supervising physician I was immediately available for consultation/collaboration.  Leslee Home, M.D.   Reuben Likes, MD 10/26/12 [phone]

## 2012-10-28 ENCOUNTER — Encounter: Payer: Self-pay | Admitting: Internal Medicine

## 2012-10-28 ENCOUNTER — Ambulatory Visit (INDEPENDENT_AMBULATORY_CARE_PROVIDER_SITE_OTHER): Payer: PRIVATE HEALTH INSURANCE | Admitting: Internal Medicine

## 2012-10-28 VITALS — BP 135/64 | HR 59 | Temp 96.6°F | Ht 71.0 in | Wt 239.7 lb

## 2012-10-28 DIAGNOSIS — R21 Rash and other nonspecific skin eruption: Secondary | ICD-10-CM

## 2012-10-28 DIAGNOSIS — I83009 Varicose veins of unspecified lower extremity with ulcer of unspecified site: Secondary | ICD-10-CM

## 2012-10-28 DIAGNOSIS — L309 Dermatitis, unspecified: Secondary | ICD-10-CM | POA: Insufficient documentation

## 2012-10-28 DIAGNOSIS — L03115 Cellulitis of right lower limb: Secondary | ICD-10-CM | POA: Insufficient documentation

## 2012-10-28 DIAGNOSIS — IMO0001 Reserved for inherently not codable concepts without codable children: Secondary | ICD-10-CM

## 2012-10-28 NOTE — Progress Notes (Signed)
HPI The patient is a 78 y.o. male with a history of blindness, HTN, COPD, diastolic CHF, presenting for an ED follow-up.  The patient was seen in the ED 6/17, and again 6/21, for a RLE venous stasis ulcer.  He first noticed the ulcer 2 weeks ago, after scratching his legs due to itching.  His daughter noticed the ulcer, and brought him to the ED, where he was diagnosed with a venous stasis ulcer.  He was instructed to apply silvadene cream, bactroban, and given a 7-day course of keflex.  The patient notes that the area has significantly improved.  He does, however, still note swelling in his bilateral legs.  He notes not taking his lasix in the last couple of weeks due to concern for polyuria while taking trips from his house.  He also has not been wearing his compression stockings.  The patient also notes an itchy rash on his bilateral arms and legs, present for about 1-2 weeks.  He was seen in the ED for this rash, and was given a medrol dosepack, as well as hydroxyzine.  Since then, the rash has significantly improved, and itching has resolved.  ROS: General: no fevers, chills, changes in weight, changes in appetite Skin: see HPI HEENT: no blurry vision, hearing changes, sore throat Pulm: no dyspnea, coughing, wheezing CV: no chest pain, palpitations, shortness of breath Abd: no abdominal pain, nausea/vomiting, diarrhea/constipation GU: no dysuria, hematuria, polyuria Ext: no arthralgias, myalgias.  See HPI Neuro: no weakness, numbness, or tingling  Filed Vitals:   10/28/12 1628  BP: 135/64  Pulse: 59  Temp: 96.6 F (35.9 C)    PEX General: alert, cooperative, and in no apparent distress HEENT: pupils equal round and reactive to light, vision grossly intact, oropharynx clear and non-erythematous  Neck: supple, no lymphadenopathy Lungs: clear to ascultation bilaterally, normal work of respiration, no wheezes, rales, ronchi Heart: regular rate and rhythm, no murmurs, gallops, or  rubs Abdomen: soft, non-tender, non-distended, normal bowel sounds Extremities: LUE with few scattered hyperpigmented papules.  Posterior RLE with an area of skin with scattered, confluent, superficial ulcerations, over an area 6 x 6.5 mm.  No drainage, erythema.  Bilateral LE with 1+ pitting edema. Neurologic: alert & oriented X3, cranial nerves II-XII intact, strength grossly intact, sensation intact to light touch  Current Outpatient Prescriptions on File Prior to Visit  Medication Sig Dispense Refill  . albuterol (PROVENTIL HFA;VENTOLIN HFA) 108 (90 BASE) MCG/ACT inhaler Inhale 2 puffs into the lungs every 6 (six) hours as needed for wheezing.  1 Inhaler  6  . amLODipine (NORVASC) 10 MG tablet TAKE 1 TABLET (10 MG TOTAL) BY MOUTH DAILY.  30 tablet  11  . aspirin EC 81 MG tablet Take 1 tablet (81 mg total) by mouth daily.  150 tablet  2  . bacitracin 500 UNIT/GM ointment Apply 1 application topically 2 (two) times daily.  453 g  0  . carvedilol (COREG) 25 MG tablet TAKE 1 TABLET (25 MG TOTAL) BY MOUTH 2 (TWO) TIMES DAILY.  60 tablet  11  . cephALEXin (KEFLEX) 500 MG capsule Take 1 capsule (500 mg total) by mouth 4 (four) times daily.  28 capsule  0  . enalapril (VASOTEC) 20 MG tablet Take 2 tablets (40 mg total) by mouth daily.  60 tablet  11  . Fluticasone-Salmeterol (ADVAIR DISKUS) 100-50 MCG/DOSE AEPB Inhale 1 puff into the lungs 2 (two) times daily.  30 each  11  . furosemide (LASIX) 20 MG tablet  Take 1 tablet (20 mg total) by mouth daily.  30 tablet  11  . gabapentin (NEURONTIN) 600 MG tablet Take 1 tablet (600 mg total) by mouth 2 (two) times daily.  60 tablet  11  . HYDROcodone-acetaminophen (NORCO) 10-325 MG per tablet Take 1 tablet by mouth every 6 (six) hours as needed for pain.  120 tablet  5  . hydrOXYzine (ATARAX/VISTARIL) 25 MG tablet Take 1 tablet (25 mg total) by mouth every 6 (six) hours as needed for itching.  20 tablet  0  . methylcellulose packet Take 1 each by mouth as  needed for constipation.  50 each  5  . methylPREDNISolone (MEDROL DOSEPAK) 4 MG tablet follow package directions  21 tablet  0  . naproxen (NAPROSYN) 500 MG tablet Take 1 tablet (500 mg total) by mouth daily as needed (arthritis pain).  30 tablet  0  . pantoprazole (PROTONIX) 20 MG tablet Take 1 tablet (20 mg total) by mouth daily.  30 tablet  11  . rosuvastatin (CRESTOR) 10 MG tablet Take 1 tablet (10 mg total) by mouth daily.  30 tablet  11  . STOOL SOFTENER 250 MG capsule TAKE 1 CAPSULE (250 MG TOTAL) BY MOUTH DAILY.  30 capsule  6  . Tadalafil (CIALIS) 2.5 MG TABS Take 1 tablet (2.5 mg total) by mouth daily as needed for erectile dysfunction.  30 tablet  1  . VOLTAREN 1 % GEL APPLY 1 APPLICATION TOPICALLY 3 (THREE) TIMES DAILY AS NEEDED FOR JOINT PAIN.  200 g  6   No current facility-administered medications on file prior to visit.    Assessment/Plan

## 2012-10-28 NOTE — Assessment & Plan Note (Signed)
The patient notes a RLE venous stasis ulcer.  He notes that it has improved substantially.  On physical exam, no evidence of infection, and the ulceration is very superficial. -bandage change daily with bactroban -complete course of Keflex given by ED -restart taking Lasix as prescribed -use compression stockings

## 2012-10-28 NOTE — Patient Instructions (Signed)
General Instructions: The ulcer on your leg is a Venous Stasis Ulcer (see information below).  To treat this -change the bandage on the area daily -continue to use bactroban, or neosporin, on the area -continue to take Lasix, your fluid pill, 1 tablet per day -wear your compression stockings to decrease swelling in your legs  For your rash, continue to use the Medrol Dose Pack, until the prescription has been completed. -you may continue to use hydroxyzine for itching  Please return for a follow-up visit in 2-3 weeks, to ensure rash has fully resolved, and ulcer is healing.   Treatment Goals:  Goals (1 Years of Data) as of 10/28/12   None      Progress Toward Treatment Goals:  Treatment Goal 10/28/2012  Blood pressure at goal    Self Care Goals & Plans:  Self Care Goal 10/10/2012  Manage my medications refill my medications on time; take my medicines as prescribed; bring my medications to every visit  Monitor my health (No Data)  Eat healthy foods eat foods that are low in salt  Be physically active take a walk every day       Care Management & Community Referrals:  Referral 10/28/2012  Referrals made for care management support none needed      Stasis Ulcer Stasis ulcers occur in the legs when the circulation is damaged. An ulcer may look like a small hole in the skin.  CAUSES Stasis ulcers occur because your veins do not work properly. Veins have valves that help the blood return to the heart. If these valves do not work right, blood flows backwards and backs up into the veins near the skin. This condition causes the veins to become larger because of increased pressure and may lead to a stasis ulcer. SYMPTOMS   Shallow (superficial) sore on the leg.  Clear drainage or weeping from the sore.  Leg pain or a feeling of heaviness. This may be worse at the end of the day.  Leg swelling.  Skin color changes. DIAGNOSIS  Your caregiver will make a diagnosis by  examining your leg. Your caregiver may order tests such as an ultrasound or other studies to evaluate the blood flow of the leg. HOME CARE INSTRUCTIONS   Do not stand or sit in one position for long periods of time. Do not sit with your legs crossed. Rest with your legs raised during the day. If possible, it is best if you can elevate your legs above your heart for 30 minutes, 3 to 4 times a day.  Wear elastic stockings or support hose. Do not wear other tight encircling garments around legs, pelvis, or waist. This causes increased pressure in your veins. If your caregiver has applied compressive medicated wraps, use them as instructed.  Walk as much as possible to increase blood flow. If you are taking long rides in a car or plane, take a break to walk around every 2 hours. If not already on aspirin, take a baby aspirin before long trips unless you have medical reasons that prohibit this.  Raise the foot of your bed at night with 2-inch blocks if approved by your caregiver. This may not be desirable if you have heart failure or breathing problems.  If you get a cut in the skin over the vein and the vein bleeds, lie down with your leg raised and gently clean the area with a clean cloth. Apply pressure on the cut until the bleeding stops. Then place a dressing  on the cut. See your caregiver if it continues to bleed or needs stitches. Also, see your caregiver if you develop an infection.Signs of an infection include a fever, redness, increased pain, and drainage of pus.  If your caregiver has given you a follow-up appointment, it is very important to keep that appointment. Not keeping the appointment could result in a chronic or permanent injury, pain, and disability. If there is any problem keeping the appointment, call your caregiver for assistance. SEEK IMMEDIATE MEDICAL CARE IF:  The ulcer area starts to break down.  You have pain, redness, tenderness, pus, or hard swelling in your leg over a  vein or near the ulcer.  Your leg pain is uncomfortable.  You develop an unexplained fever.  You develop chest pain or shortness of breath. Document Released: 01/17/2001 Document Revised: 07/17/2011 Document Reviewed: 08/14/2010 [facility] Patient Information 2014 [place], [place].

## 2012-10-28 NOTE — Assessment & Plan Note (Signed)
The patient notes a pruritic rash on his arms and legs, which has nearly resolved with a medrol dose pack.  He still has a few scattered hyperpigmented papules on his left forearm and right leg.  Given that this rash is in the stages of healing, it's difficult to know what the inciting lesion was. -continue to complete steroid dose pack -return in 2-3 weeks to ensure resolution of rash

## 2012-10-29 NOTE — Progress Notes (Signed)
Case discussed with Dr. Manson Passey at the time of the visit.  We reviewed the resident's history and exam and pertinent patient test results.  I agree with the assessment, diagnosis, and plan of care documented in the resident's note.  Nicholas [name] has a partially treated venous stasis ulcer and partially treated rash of unknown origin.  At this point, it is difficult to interpret findings due to the nature of being part way through a course of treatment with steroids.  Agree with Dr. Theora Gianotti plan to complete course of steroids and re-evaluate for resolution.

## 2012-11-04 NOTE — Assessment & Plan Note (Signed)
He continues to take his advair PRN and states that it doesn't help his breathing at all.  The results from his PFTs do not show true COPD but he is right on the edge.    We discussed that he shouldn't feel the effects of the advair other then that he doesn't get short of breath as often.  He was advised that if he has to use his albuterol more then 3-4 times per week that he should be on a controller medication.  He was encouraged to quit smoking.

## 2012-11-04 NOTE — Assessment & Plan Note (Signed)
His sciatica is better today though still tender.  We will continue with the gabapentin, ice, and rest to help it settle down.

## 2012-11-04 NOTE — Assessment & Plan Note (Signed)
He states that he has been taking his medications and he brought them with today.  His amlodipine bottle has not been filled this month and there are still tablets inside.  He also has no enalapril with him that he states he is going to pick up today.  He states that he has been taking his lasix 2-3 times per week instead of daily.  We will decrease the lasix to 20 mg daily and have him pick up the enalapril and the refill for his amlodipine.  He will follo wup in 1-2 months with his PCP Dr. Manson Passey

## 2012-11-06 ENCOUNTER — Other Ambulatory Visit: Payer: Self-pay | Admitting: *Deleted

## 2012-11-06 DIAGNOSIS — K5900 Constipation, unspecified: Secondary | ICD-10-CM

## 2012-11-06 DIAGNOSIS — I10 Essential (primary) hypertension: Secondary | ICD-10-CM

## 2012-11-07 MED ORDER — ROSUVASTATIN CALCIUM 10 MG PO TABS: 10.0000 mg | ORAL_TABLET | Freq: Every day | ORAL | Status: DC

## 2012-11-07 MED ORDER — GABAPENTIN 600 MG PO TABS: 600.0000 mg | ORAL_TABLET | Freq: Two times a day (BID) | ORAL | Status: DC

## 2012-11-07 MED ORDER — METHYLCELLULOSE (LAXATIVE) PO PACK: 1.0000 | PACK | ORAL | Status: DC | PRN

## 2012-11-07 MED ORDER — ASPIRIN 81 MG PO TBEC: 81.0000 mg | DELAYED_RELEASE_TABLET | Freq: Every day | ORAL | Status: AC

## 2012-11-07 MED ORDER — ALBUTEROL SULFATE HFA 108 (90 BASE) MCG/ACT IN AERS: 2.0000 | INHALATION_SPRAY | Freq: Four times a day (QID) | RESPIRATORY_TRACT | Status: DC | PRN

## 2012-11-09 ENCOUNTER — Emergency Department (HOSPITAL_COMMUNITY)
Admission: EM | Admit: 2012-11-09 | Discharge: 2012-11-09 | Disposition: A | Discharge status: Home / Self Care | Payer: PRIVATE HEALTH INSURANCE | Attending: Emergency Medicine | Admitting: Emergency Medicine

## 2012-11-09 DIAGNOSIS — I878 Other specified disorders of veins: Secondary | ICD-10-CM

## 2012-11-09 DIAGNOSIS — Z7982 Long term (current) use of aspirin: Secondary | ICD-10-CM | POA: Insufficient documentation

## 2012-11-09 DIAGNOSIS — Z87891 Personal history of nicotine dependence: Secondary | ICD-10-CM | POA: Insufficient documentation

## 2012-11-09 DIAGNOSIS — M109 Gout, unspecified: Secondary | ICD-10-CM | POA: Insufficient documentation

## 2012-11-09 DIAGNOSIS — IMO0002 Reserved for concepts with insufficient information to code with codable children: Secondary | ICD-10-CM | POA: Insufficient documentation

## 2012-11-09 DIAGNOSIS — I872 Venous insufficiency (chronic) (peripheral): Secondary | ICD-10-CM | POA: Insufficient documentation

## 2012-11-09 DIAGNOSIS — I10 Essential (primary) hypertension: Secondary | ICD-10-CM | POA: Insufficient documentation

## 2012-11-09 DIAGNOSIS — R21 Rash and other nonspecific skin eruption: Secondary | ICD-10-CM

## 2012-11-09 DIAGNOSIS — Z8679 Personal history of other diseases of the circulatory system: Secondary | ICD-10-CM | POA: Insufficient documentation

## 2012-11-09 DIAGNOSIS — Z79899 Other long term (current) drug therapy: Secondary | ICD-10-CM | POA: Insufficient documentation

## 2012-11-09 DIAGNOSIS — K219 Gastro-esophageal reflux disease without esophagitis: Secondary | ICD-10-CM | POA: Insufficient documentation

## 2012-11-09 DIAGNOSIS — L03115 Cellulitis of right lower limb: Secondary | ICD-10-CM

## 2012-11-09 DIAGNOSIS — L02419 Cutaneous abscess of limb, unspecified: Secondary | ICD-10-CM | POA: Insufficient documentation

## 2012-11-09 DIAGNOSIS — E785 Hyperlipidemia, unspecified: Secondary | ICD-10-CM | POA: Insufficient documentation

## 2012-11-09 DIAGNOSIS — H543 Unqualified visual loss, both eyes: Secondary | ICD-10-CM | POA: Insufficient documentation

## 2012-11-09 LAB — COMPREHENSIVE METABOLIC PANEL
AST: 13 U/L (ref 0–37)
Albumin: 2.7 g/dL — ABNORMAL LOW (ref 3.5–5.2)
Alkaline Phosphatase: 62 U/L (ref 39–117)
Chloride: 103 mEq/L (ref 96–112)
Creatinine, Ser: 0.97 mg/dL (ref 0.50–1.35)
Potassium: 3.4 mEq/L — ABNORMAL LOW (ref 3.5–5.1)
Total Bilirubin: 0.3 mg/dL (ref 0.3–1.2)
Total Protein: 6.1 g/dL (ref 6.0–8.3)

## 2012-11-09 LAB — CBC WITH DIFFERENTIAL/PLATELET
Eosinophils Absolute: 0.4 10*3/uL (ref 0.0–0.7)
Eosinophils Relative: 4 % (ref 0–5)
HCT: 32.7 % — ABNORMAL LOW (ref 39.0–52.0)
Hemoglobin: 10.5 g/dL — ABNORMAL LOW (ref 13.0–17.0)
Lymphs Abs: 1 10*3/uL (ref 0.7–4.0)
MCH: 27.3 pg (ref 26.0–34.0)
MCV: 84.9 fL (ref 78.0–100.0)
Monocytes Absolute: 1.1 10*3/uL — ABNORMAL HIGH (ref 0.1–1.0)
Monocytes Relative: 11 % (ref 3–12)
RBC: 3.85 MIL/uL — ABNORMAL LOW (ref 4.22–5.81)

## 2012-11-09 LAB — POCT I-STAT, CHEM 8
BUN: 19 mg/dL (ref 6–23)
Chloride: 103 mEq/L (ref 96–112)
Creatinine, Ser: 1.1 mg/dL (ref 0.50–1.35)
Sodium: 140 mEq/L (ref 135–145)
TCO2: 26 mmol/L (ref 0–100)

## 2012-11-09 LAB — CG4 I-STAT (LACTIC ACID): Lactic Acid, Venous: 1 mmol/L (ref 0.5–2.2)

## 2012-11-09 MED ORDER — HYDROXYZINE HCL 25 MG PO TABS
25.0000 mg | ORAL_TABLET | Freq: Once | ORAL | Status: AC
  Administered 2012-11-09: 25 mg via ORAL
  Filled 2012-11-09: qty 1

## 2012-11-09 MED ORDER — CEPHALEXIN 500 MG PO CAPS: 500.0000 mg | ORAL_CAPSULE | Freq: Three times a day (TID) | ORAL | Status: DC

## 2012-11-09 MED ORDER — TRIAMCINOLONE ACETONIDE 0.1 % EX CREA: TOPICAL_CREAM | Freq: Two times a day (BID) | CUTANEOUS | Status: DC

## 2012-11-09 NOTE — ED Provider Notes (Signed)
History    CSN: [id number] Arrival date & time 11/09/12  1031  First MD Initiated Contact with Patient 11/09/12 1105     Chief Complaint  Patient presents with  . Rash   (Consider location/radiation/quality/duration/timing/severity/associated sxs/prior Treatment) HPI Nicholas [name] is a 78 y.o. male who presents to ED with complaint of itching and rash over entire body. Pt states he has had this rash for years. States has been to UC few times. Was given itching mediation and antibiotic cream for right leg. States it has been draining. States it is mildly painful. He is putting baby powder on the right leg because "it is leaking."  Denies any fever, chills. No malaise. States "i just cant get this itching under control."    Past Medical History  Diagnosis Date  . Gout     Lt Toe  . GERD (gastroesophageal reflux disease)   . Hypertension   . Blind   . Hyperlipidemia   . Venous stasis dermatitis    Past Surgical History  Procedure Laterality Date  . Colon surgery     No family history on file. History  Substance Use Topics  . Smoking status: Former Games developer  . Smokeless tobacco: Not on file  . Alcohol Use: No    Review of Systems  Constitutional: Negative for fever and chills.  HENT: Negative for neck pain and neck stiffness.   Respiratory: Negative.   Cardiovascular: Negative.   Gastrointestinal: Negative.   Genitourinary: Negative for dysuria and flank pain.  Skin: Positive for color change, rash and wound.  Neurological: Negative for dizziness, weakness, light-headedness and headaches.    Allergies  Review of patient's allergies indicates no known allergies.  Home Medications   Current Outpatient Rx  Name  Route  Sig  Dispense  Refill  . albuterol (PROVENTIL HFA;VENTOLIN HFA) 108 (90 BASE) MCG/ACT inhaler   Inhalation   Inhale 2 puffs into the lungs every 6 (six) hours as needed for wheezing.   3 Inhaler   11   . amLODipine (NORVASC) 10 MG tablet     TAKE 1 TABLET (10 MG TOTAL) BY MOUTH DAILY.   30 tablet   11   . aspirin 81 MG EC tablet   Oral   Take 1 tablet (81 mg total) by mouth daily.   30 tablet   11   . bacitracin 500 UNIT/GM ointment   Topical   Apply 1 application topically 2 (two) times daily.   453 g   0   . carvedilol (COREG) 25 MG tablet      TAKE 1 TABLET (25 MG TOTAL) BY MOUTH 2 (TWO) TIMES DAILY.   60 tablet   11   . cephALEXin (KEFLEX) 500 MG capsule   Oral   Take 1 capsule (500 mg total) by mouth 4 (four) times daily.   28 capsule   0   . enalapril (VASOTEC) 20 MG tablet   Oral   Take 2 tablets (40 mg total) by mouth daily.   60 tablet   11   . Fluticasone-Salmeterol (ADVAIR DISKUS) 100-50 MCG/DOSE AEPB   Inhalation   Inhale 1 puff into the lungs 2 (two) times daily.   30 each   11   . furosemide (LASIX) 20 MG tablet   Oral   Take 1 tablet (20 mg total) by mouth daily.   30 tablet   11   . gabapentin (NEURONTIN) 600 MG tablet   Oral   Take 1 tablet (600 mg  total) by mouth 2 (two) times daily.   180 tablet   3   . HYDROcodone-acetaminophen (NORCO) 10-325 MG per tablet   Oral   Take 1 tablet by mouth every 6 (six) hours as needed for pain.   120 tablet   5   . hydrOXYzine (ATARAX/VISTARIL) 25 MG tablet   Oral   Take 1 tablet (25 mg total) by mouth every 6 (six) hours as needed for itching.   20 tablet   0   . methylcellulose packet   Oral   Take 1 each by mouth as needed for constipation.   50 each   5   . methylPREDNISolone (MEDROL DOSEPAK) 4 MG tablet      follow package directions   21 tablet   0   . naproxen (NAPROSYN) 500 MG tablet   Oral   Take 1 tablet (500 mg total) by mouth daily as needed (arthritis pain).   30 tablet   0   . pantoprazole (PROTONIX) 20 MG tablet   Oral   Take 1 tablet (20 mg total) by mouth daily.   30 tablet   11   . rosuvastatin (CRESTOR) 10 MG tablet   Oral   Take 1 tablet (10 mg total) by mouth daily.   90 tablet   3   .  STOOL SOFTENER 250 MG capsule      TAKE 1 CAPSULE (250 MG TOTAL) BY MOUTH DAILY.   30 capsule   6   . Tadalafil (CIALIS) 2.5 MG TABS   Oral   Take 1 tablet (2.5 mg total) by mouth daily as needed for erectile dysfunction.   30 tablet   1   . VOLTAREN 1 % GEL      APPLY 1 APPLICATION TOPICALLY 3 (THREE) TIMES DAILY AS NEEDED FOR JOINT PAIN.   200 g   6    BP 137/60  Pulse 77  Temp(Src) 99 F (37.2 C) (Oral)  Resp 18  SpO2 92% Physical Exam  Nursing note and vitals reviewed. Constitutional: He appears well-developed and well-nourished. No distress.  Cardiovascular: Normal rate, regular rhythm and normal heart sounds.   Pulmonary/Chest: Effort normal and breath sounds normal. No respiratory distress. He has no wheezes. He has no rales.  Musculoskeletal: He exhibits edema.  Dorsal pedal pulses intact  Skin: Skin is warm and dry.  Dry erythematous scaly rash to bilateral hands, neck, feet, inguinal areas. Right lower leg erythematous from below knee down. Tender to palpation over the foot.  Warm to the touch. Weeping clear drainage.  Bilateral 1+ pitting LE edema.     ED Course  Procedures (including critical care time) Labs Reviewed  CBC WITH DIFFERENTIAL   Results for orders placed during the hospital encounter of 11/09/12  CBC WITH DIFFERENTIAL      Result Value Range   WBC 10.1  4.0 - 10.5 K/uL   RBC 3.85 (*) 4.22 - 5.81 MIL/uL   Hemoglobin 10.5 (*) 13.0 - 17.0 g/dL   HCT 45.4 (*) 09.8 - 11.9 %   MCV 84.9  78.0 - 100.0 fL   MCH 27.3  26.0 - 34.0 pg   MCHC 32.1  30.0 - 36.0 g/dL   RDW 14.7  82.9 - 56.2 %   Platelets 236  150 - 400 K/uL   Neutrophils Relative % 75  43 - 77 %   Neutro Abs 7.6  1.7 - 7.7 K/uL   Lymphocytes Relative 10 (*) 12 - 46 %  Lymphs Abs 1.0  0.7 - 4.0 K/uL   Monocytes Relative 11  3 - 12 %   Monocytes Absolute 1.1 (*) 0.1 - 1.0 K/uL   Eosinophils Relative 4  0 - 5 %   Eosinophils Absolute 0.4  0.0 - 0.7 K/uL   Basophils Relative 0  0 -  1 %   Basophils Absolute 0.0  0.0 - 0.1 K/uL  COMPREHENSIVE METABOLIC PANEL      Result Value Range   Sodium 137  135 - 145 mEq/L   Potassium 3.4 (*) 3.5 - 5.1 mEq/L   Chloride 103  96 - 112 mEq/L   CO2 27  19 - 32 mEq/L   Glucose, Bld 146 (*) 70 - 99 mg/dL   BUN 18  6 - 23 mg/dL   Creatinine, Ser 4.78  0.50 - 1.35 mg/dL   Calcium 8.4  8.4 - 29.5 mg/dL   Total Protein 6.1  6.0 - 8.3 g/dL   Albumin 2.7 (*) 3.5 - 5.2 g/dL   AST 13  0 - 37 U/L   ALT 12  0 - 53 U/L   Alkaline Phosphatase 62  39 - 117 U/L   Total Bilirubin 0.3  0.3 - 1.2 mg/dL   GFR calc non Af Amer 78 (*) >90 mL/min   GFR calc Af Amer >90  >90 mL/min  CG4 I-STAT (LACTIC ACID)      Result Value Range   Lactic Acid, Venous 1.00  0.5 - 2.2 mmol/L  POCT I-STAT, CHEM 8      Result Value Range   Sodium 140  135 - 145 mEq/L   Potassium 3.3 (*) 3.5 - 5.1 mEq/L   Chloride 103  96 - 112 mEq/L   BUN 19  6 - 23 mg/dL   Creatinine, Ser 6.21  0.50 - 1.35 mg/dL   Glucose, Bld 308 (*) 70 - 99 mg/dL   Calcium, Ion 6.57 (*) 1.13 - 1.30 mmol/L   TCO2 26  0 - 100 mmol/L   Hemoglobin 11.2 (*) 13.0 - 17.0 g/dL   HCT 84.6 (*) 96.2 - 95.2 %   No results found.   No results found. 1. Cellulitis of right leg   2. Venous stasis   3. Rash     MDM  Pt with rash over entire body that he has had for 'years.' Rash most likely eczema. Will start on kenalog. RLE is redness, swelling, warm to the touch. Suspect most likely cellulitis from venous statis ulcerations. Pt with several recent vistis to UC, pre notes, leg did not appear infected on last visit. Documented "no erythema or signs of cellulitis." pt was instructed to apply bacitracin and keep it covered. He has been applying some bacitracin, but states mainly uses baby powder. I had to wash caked on baby powder off of his leg in order to examine it. I discussed pt with Dr. Fonnie Jarvis. At this time, pt is neurovascularly intact. Does not appear to be acutely septic. Will start on oral  antibiotics. Pt and his family instructed to follow up in 2-3 days with PCP or uc, emphasized importance of compliance with medications and follow up. Family and pt voiced understanding.   Filed Vitals:   11/09/12 1054  BP: 137/60  Pulse: 77  Temp: 99 F (37.2 C)  Resp: 18     Julianny Milstein A Angelyse Heslin, PA-C 11/09/12 1349

## 2012-11-09 NOTE — ED Provider Notes (Signed)
Medical screening examination/treatment/procedure(s) were conducted as a shared visit with non-physician practitioner(s) and myself.  I personally evaluated the patient during the encounter.  Stasis dermatitis now appears to have localized cellulitis right lower leg. Doubt nec. fasc.  Hurman Horn, MD 11/09/12 2242

## 2012-11-09 NOTE — ED Notes (Signed)
C/o itchy rash all over the body x 2 weeks. Sts he gets the same rash on and off for several years. Pimple rash noted on bilateral arms and legs. Pt sts he hasn't had any change in his daily routine and denied any product change. Pt noted to have somewhat poor hygiene, skin are dry and scaly. O2Sat was 87% Room air; 96% on 2 L [place]. Lungs are clear. Vital sign stable.

## 2012-11-11 ENCOUNTER — Encounter: Payer: Self-pay | Admitting: Internal Medicine

## 2012-11-11 ENCOUNTER — Ambulatory Visit (INDEPENDENT_AMBULATORY_CARE_PROVIDER_SITE_OTHER): Payer: PRIVATE HEALTH INSURANCE | Admitting: Internal Medicine

## 2012-11-11 VITALS — BP 149/73 | HR 61 | Temp 97.9°F | Wt 249.4 lb

## 2012-11-11 DIAGNOSIS — R21 Rash and other nonspecific skin eruption: Secondary | ICD-10-CM

## 2012-11-11 DIAGNOSIS — I10 Essential (primary) hypertension: Secondary | ICD-10-CM

## 2012-11-11 LAB — CBC WITH DIFFERENTIAL/PLATELET
Eosinophils Absolute: 0.6 10*3/uL (ref 0.0–0.7)
Eosinophils Relative: 7 % — ABNORMAL HIGH (ref 0–5)
HCT: 30.8 % — ABNORMAL LOW (ref 39.0–52.0)
Lymphocytes Relative: 15 % (ref 12–46)
Lymphs Abs: 1.2 10*3/uL (ref 0.7–4.0)
MCH: 26.4 pg (ref 26.0–34.0)
MCV: 80.4 fL (ref 78.0–100.0)
Monocytes Absolute: 1.3 10*3/uL — ABNORMAL HIGH (ref 0.1–1.0)
Monocytes Relative: 15 % — ABNORMAL HIGH (ref 3–12)
Neutrophils Relative %: 63 % (ref 43–77)
Platelets: 292 10*3/uL (ref 150–400)
RBC: 3.83 MIL/uL — ABNORMAL LOW (ref 4.22–5.81)
WBC: 8.5 10*3/uL (ref 4.0–10.5)

## 2012-11-11 MED ORDER — PREDNISONE 20 MG PO TABS: 20.0000 mg | ORAL_TABLET | Freq: Every day | ORAL | Status: DC

## 2012-11-11 NOTE — Patient Instructions (Signed)
General Instructions: Please take the prednisone 20mg  daily until you follow up with [place] and dermatology.  If you notice worsening of rash or pain, call us immediately or go to ED [phone]  Please stop taking the antibiotics   Treatment Goals:  Goals (1 Years of Data) as of 11/11/12   None      Progress Toward Treatment Goals:  Treatment Goal 11/11/2012  Blood pressure deteriorated    Self Care Goals & Plans:  Self Care Goal 10/28/2012  Manage my medications take my medicines as prescribed; bring my medications to every visit; refill my medications on time  Monitor my health -  Eat healthy foods -  Be physically active take a walk every day    Care Management & Community Referrals:  Referral 10/28/2012  Referrals made for care management support none needed      Prednisone tablets What is this medicine? PREDNISONE (PRED ni sone) is a corticosteroid. It is commonly used to treat inflammation of the skin, joints, lungs, and other organs. Common conditions treated include asthma, allergies, and arthritis. It is also used for other conditions, such as blood disorders and diseases of the adrenal glands. This medicine may be used for other purposes; ask your health care provider or pharmacist if you have questions. What should I tell my health care provider before I take this medicine? They need to know if you have any of these conditions: -Cushing's syndrome -diabetes -glaucoma -heart disease -high blood pressure -infection (especially a virus infection such as chickenpox, cold sores, or herpes) -kidney disease -liver disease -mental illness -myasthenia gravis -osteoporosis -seizures -stomach or intestine problems -thyroid disease -an unusual or allergic reaction to lactose, prednisone, other medicines, foods, dyes, or preservatives -pregnant or trying to get pregnant -breast-feeding How should I use this medicine? Take this medicine by mouth with a glass of water.  Follow the directions on the prescription label. Take this medicine with food. If you are taking this medicine once a day, take it in the morning. Do not take more medicine than you are told to take. Do not suddenly stop taking your medicine because you may develop a severe reaction. Your doctor will tell you how much medicine to take. If your doctor wants you to stop the medicine, the dose may be slowly lowered over time to avoid any side effects. Talk to your pediatrician regarding the use of this medicine in children. Special care may be needed. Overdosage: If you think you have taken too much of this medicine contact a poison control center or emergency room at once. NOTE: This medicine is only for you. Do not share this medicine with others. What if I miss a dose? If you miss a dose, take it as soon as you can. If it is almost time for your next dose, talk to your doctor or health care professional. You may need to miss a dose or take an extra dose. Do not take double or extra doses without advice. What may interact with this medicine? Do not take this medicine with any of the following medications: -metyrapone -mifepristone This medicine may also interact with the following medications: -aminoglutethimide -amphotericin B -aspirin and aspirin-like medicines -barbiturates -certain medicines for diabetes, like glipizide or glyburide -cholestyramine -cholinesterase inhibitors -cyclosporine -digoxin -diuretics -ephedrine -male hormones, like estrogens and birth control pills -isoniazid -ketoconazole -NSAIDS, medicines for pain and inflammation, like ibuprofen or naproxen -phenytoin -rifampin -toxoids -vaccines -warfarin This list may not describe all possible interactions. Give your health care  provider a list of all the medicines, herbs, non-prescription drugs, or dietary supplements you use. Also tell them if you smoke, drink alcohol, or use illegal drugs. Some items may interact  with your medicine. What should I watch for while using this medicine? Visit your doctor or health care professional for regular checks on your progress. If you are taking this medicine over a prolonged period, carry an identification card with your name and address, the type and dose of your medicine, and your doctor's name and address. This medicine may increase your risk of getting an infection. Tell your doctor or health care professional if you are around anyone with measles or chickenpox, or if you develop sores or blisters that do not heal properly. If you are going to have surgery, tell your doctor or health care professional that you have taken this medicine within the last twelve months. Ask your doctor or health care professional about your diet. You may need to lower the amount of salt you eat. This medicine may affect blood sugar levels. If you have diabetes, check with your doctor or health care professional before you change your diet or the dose of your diabetic medicine. What side effects may I notice from receiving this medicine? Side effects that you should report to your doctor or health care professional as soon as possible: -allergic reactions like skin rash, itching or hives, swelling of the face, lips, or tongue -changes in emotions or moods -changes in vision -depressed mood -eye pain -fever or chills, cough, sore throat, pain or difficulty passing urine -increased thirst -swelling of ankles, feet Side effects that usually do not require medical attention (report to your doctor or health care professional if they continue or are bothersome): -confusion, excitement, restlessness -headache -nausea, vomiting -skin problems, acne, thin and shiny skin -trouble sleeping -weight gain This list may not describe all possible side effects. Call your doctor for medical advice about side effects. You may report side effects to FDA at 1-800-FDA-1088. Where should I keep my  medicine? Keep out of the reach of children. Store at room temperature between 15 and 30 degrees C (59 and 86 degrees F). Protect from light. Keep container tightly closed. Throw away any unused medicine after the expiration date. NOTE: This sheet is a summary. It may not cover all possible information. If you have questions about this medicine, talk to your doctor, pharmacist, or health care provider.  2012, [employer]. (12/08/2010 10:57:14 AM)

## 2012-11-11 NOTE — Assessment & Plan Note (Addendum)
x1 month, worsening, pruritic, spares face but spread all over body, minimal improvement with keflex, marked improvement with steroids in the past.  Similar episode 5 years ago when treated with dermatologist.  Nicholas [name].  ?dyshidrotic eczema.  Does not appear infected.   -urgent dermatology consult -start prednisone 20mg  qd due to severity of rash -follow closely -d//c antibiotics

## 2012-11-11 NOTE — Assessment & Plan Note (Signed)
BP Readings from Last 3 Encounters:  11/11/12 149/73  11/09/12 144/53  10/28/12 135/64   Lab Results  Component Value Date   NA 140 11/09/2012   K 3.3* 11/09/2012   CREATININE 1.10 11/09/2012   Assessment: Blood pressure control: mildly elevated Progress toward BP goal:  deteriorated  Plan: Medications:  continue current medications norvasc 10, coreg 25, vasotec 40mg , and lasix 20mg  qd Educational resources provided:   Self management tools provided:   Other plans: likely also elevated due to pain.  Cbc cmet today

## 2012-11-11 NOTE — Progress Notes (Signed)
Subjective:   Patient ID: Nicholas [name] male   DOB: December 10, 1934 78 y.o.   MRN: [id number]  HPI: [name] is a 78 y.o. pleasant African American blind male with PMH of HTN and GERD presenting to clinic today with his daughter and grandson for continued complaints of worsening body rash.  He and his daughter claim the rash originally started on his left foot but has now spread all over his body.  It is red, itchy, and worse on RLE and hands and feet with desquamation and skin breakdown especially in those areas.  He says he had a similar rash ~5 years ago and was treated effectively with a dermatologist, but this time he has not been to the dermatologist and not much has helped.  He was given a steroid pack for a RLE ulcer that he says helped with the rash, however, when he stopped the steroids the rash returned.  He has completed a trial of keflex with minimal improvement and was last seen in ED again 11/09/12 and started on another course of Keflex for ?cellulitis. The daughter does claim there is minimal drainage from the RLE at times but that the ulcer has healed. He denies any fever, chills, SOB, chest pain, N/V/D, abdominal pain, or any urinary complaints.    Past Medical History  Diagnosis Date  . Gout     Lt Toe  . GERD (gastroesophageal reflux disease)   . Hypertension   . Blind   . Hyperlipidemia   . Venous stasis dermatitis    Current Outpatient Prescriptions  Medication Sig Dispense Refill  . albuterol (PROVENTIL HFA;VENTOLIN HFA) 108 (90 BASE) MCG/ACT inhaler Inhale 2 puffs into the lungs every 6 (six) hours as needed for wheezing.  3 Inhaler  11  . amLODipine (NORVASC) 10 MG tablet TAKE 1 TABLET (10 MG TOTAL) BY MOUTH DAILY.  30 tablet  11  . aspirin 81 MG EC tablet Take 1 tablet (81 mg total) by mouth daily.  30 tablet  11  . bacitracin 500 UNIT/GM ointment Apply 1 application topically 2 (two) times daily.  453 g  0  . carvedilol (COREG) 25 MG tablet TAKE 1 TABLET (25  MG TOTAL) BY MOUTH 2 (TWO) TIMES DAILY.  60 tablet  11  . enalapril (VASOTEC) 20 MG tablet Take 2 tablets (40 mg total) by mouth daily.  60 tablet  11  . Fluticasone-Salmeterol (ADVAIR DISKUS) 100-50 MCG/DOSE AEPB Inhale 1 puff into the lungs 2 (two) times daily.  30 each  11  . furosemide (LASIX) 20 MG tablet Take 1 tablet (20 mg total) by mouth daily.  30 tablet  11  . gabapentin (NEURONTIN) 600 MG tablet Take 1 tablet (600 mg total) by mouth 2 (two) times daily.  180 tablet  3  . HYDROcodone-acetaminophen (NORCO) 10-325 MG per tablet Take 1 tablet by mouth every 6 (six) hours as needed for pain.  120 tablet  5  . hydrOXYzine (ATARAX/VISTARIL) 25 MG tablet Take 1 tablet (25 mg total) by mouth every 6 (six) hours as needed for itching.  20 tablet  0  . methylcellulose packet Take 1 each by mouth as needed for constipation.  50 each  5  . naproxen (NAPROSYN) 500 MG tablet Take 1 tablet (500 mg total) by mouth daily as needed (arthritis pain).  30 tablet  0  . pantoprazole (PROTONIX) 20 MG tablet Take 1 tablet (20 mg total) by mouth daily.  30 tablet  11  . predniSONE (DELTASONE)  20 MG tablet Take 1 tablet (20 mg total) by mouth daily.  30 tablet  0  . rosuvastatin (CRESTOR) 10 MG tablet Take 1 tablet (10 mg total) by mouth daily.  90 tablet  3  . STOOL SOFTENER 250 MG capsule TAKE 1 CAPSULE (250 MG TOTAL) BY MOUTH DAILY.  30 capsule  6  . Tadalafil (CIALIS) 2.5 MG TABS Take 1 tablet (2.5 mg total) by mouth daily as needed for erectile dysfunction.  30 tablet  1  . triamcinolone cream (KENALOG) 0.1 % Apply topically 2 (two) times daily.  30 g  0  . VOLTAREN 1 % GEL APPLY 1 APPLICATION TOPICALLY 3 (THREE) TIMES DAILY AS NEEDED FOR JOINT PAIN.  200 g  6   No current facility-administered medications for this visit.   No family history on file. History   Social History  . Marital Status: Divorced    Spouse Name: N/A    Number of Children: N/A  . Years of Education: N/A   Social History Main  Topics  . Smoking status: Former Games developer  . Smokeless tobacco: None  . Alcohol Use: No  . Drug Use: No  . Sexually Active: None   Other Topics Concern  . None   Social History Narrative  . None   Review of Systems:  Constitutional:  Denies fever, chills, diaphoresis, appetite change and fatigue.   HEENT:  Denies congestion, sore throat, rhinorrhea, sneezing, mouth sores, trouble swallowing, neck pain   Respiratory:  Denies SOB, DOE, cough, and wheezing.   Cardiovascular:  Denies palpitations and leg swelling.   Gastrointestinal:  Denies nausea, vomiting, abdominal pain, diarrhea, constipation, blood in stool and abdominal distention.   Genitourinary:  Denies dysuria, urgency, frequency, hematuria, flank pain and difficulty urinating.   Musculoskeletal:  RLE pain and hands from desquamation of rash.  In wheelchair.   Skin:  Diffuse body rash  Neurological:  Denies dizziness, seizures, syncope, weakness, light-headedness, numbness and headaches.    Objective:  Physical Exam: Filed Vitals:   11/11/12 1618  BP: 149/73  Pulse: 61  Temp: 97.9 F (36.6 C)  TempSrc: Oral  Weight: 249 lb 6.4 oz (113.127 kg)  SpO2: 95%   Vitals reviewed. General: sitting in [place]air, NAD HEENT: PERRL, EOMI, no scleral icterus Cardiac: RRR, no rubs, murmurs or gallops Pulm: clear to auscultation bilaterally, no wheezes, rales, or rhonchi Abd: soft, nontender, nondistended, BS present Ext: warm and well perfused, desquamation and skin darkening of RLE, B/L hands, and numerous raised pruritic pappules all over body but sparing the face and soles of feet.   Neuro: alert and oriented X3, cranial nerves II-XII grossly intact, strength and sensation to light touch equal in bilateral upper and lower extremities   Assessment & Plan:  Discussed with Dr. Rogelia Boga ?dyshidrotic eczema--start prednisone 20mg  qd, urgent derm consult Cbc, cmet

## 2012-11-12 LAB — COMPREHENSIVE METABOLIC PANEL
ALT: 13 U/L (ref 0–53)
Albumin: 3.4 g/dL — ABNORMAL LOW (ref 3.5–5.2)
BUN: 22 mg/dL (ref 6–23)
CO2: 28 mEq/L (ref 19–32)
Calcium: 8.7 mg/dL (ref 8.4–10.5)
Chloride: 103 mEq/L (ref 96–112)
Creat: 1.12 mg/dL (ref 0.50–1.35)
Glucose, Bld: 96 mg/dL (ref 70–99)
Potassium: 3.5 mEq/L (ref 3.5–5.3)
Total Bilirubin: 0.3 mg/dL (ref 0.3–1.2)

## 2012-11-13 NOTE — Progress Notes (Signed)
I saw and evaluated the patient.  I personally confirmed the key portions of the history and exam documented by Dr. Qureshi and I reviewed pertinent patient test results.  The assessment, diagnosis, and plan were formulated together and I agree with the documentation in the resident's note. 

## 2012-11-21 ENCOUNTER — Encounter: Payer: PRIVATE HEALTH INSURANCE | Admitting: Internal Medicine

## 2012-12-05 ENCOUNTER — Encounter: Payer: Self-pay | Admitting: Internal Medicine

## 2012-12-05 ENCOUNTER — Ambulatory Visit (INDEPENDENT_AMBULATORY_CARE_PROVIDER_SITE_OTHER): Payer: PRIVATE HEALTH INSURANCE | Admitting: Internal Medicine

## 2012-12-05 VITALS — BP 130/60 | HR 66 | Temp 97.7°F | Ht 71.0 in | Wt 241.1 lb

## 2012-12-05 DIAGNOSIS — I83009 Varicose veins of unspecified lower extremity with ulcer of unspecified site: Secondary | ICD-10-CM

## 2012-12-05 DIAGNOSIS — L259 Unspecified contact dermatitis, unspecified cause: Secondary | ICD-10-CM

## 2012-12-05 DIAGNOSIS — L309 Dermatitis, unspecified: Secondary | ICD-10-CM

## 2012-12-05 DIAGNOSIS — IMO0001 Reserved for inherently not codable concepts without codable children: Secondary | ICD-10-CM

## 2012-12-05 MED ORDER — TRIAMCINOLONE ACETONIDE 0.1 % EX CREA: TOPICAL_CREAM | Freq: Two times a day (BID) | CUTANEOUS | Status: DC

## 2012-12-05 MED ORDER — NAPROXEN 500 MG PO TABS: 500.0000 mg | ORAL_TABLET | Freq: Every day | ORAL | Status: DC | PRN

## 2012-12-05 NOTE — Assessment & Plan Note (Signed)
RLE venous stasis ulcer has significantly improved.  He received a total of 2 rounds of keflex for possible cellulitis of the area, though I think the most benefit came from lasix and compression stockings. -continue lasix, compression stockings

## 2012-12-05 NOTE — Progress Notes (Signed)
HPI The patient is a 78 y.o. male with a history of blindness, HTN, chronic R hip pain, COPD, presenting for a follow-up visit.  The patient was seen on 7/7 for a generalized, itchy rash, though to represent eczema.  He was given prednisone 20 mg daily x30 days, and referred to dermatology.  The patient states he never went to dermatology.  He states that instead he mistakenly took prednisone 20 mg twice per day, and ran out of this medication 1-2 weeks ago.  He notes that the rash has significantly improved, but he still notes a few areas of itchy skin on his right lateral thigh.  ROS: General: no fevers, chills, changes in weight, changes in appetite Skin: see HPI HEENT: no hearing changes, sore throat Pulm: no dyspnea, coughing, wheezing CV: no chest pain, palpitations, shortness of breath Abd: no abdominal pain, nausea/vomiting, diarrhea/constipation GU: no dysuria, hematuria, polyuria Ext: no arthralgias, myalgias Neuro: no weakness, numbness, or tingling  Filed Vitals:   12/05/12 1504  BP: 130/60  Pulse: 66  Temp: 97.7 F (36.5 C)    PEX General: alert, cooperative, and in no apparent distress Skin: still some mild dry skin on proximal palms left thigh, though with no excoriations or abrasions HEENT: oropharynx clear and non-erythematous  Neck: supple, no lymphadenopathy Lungs: clear to ascultation bilaterally, normal work of respiration, no wheezes, rales, ronchi Heart: regular rate and rhythm, no murmurs, gallops, or rubs Abdomen: soft, non-tender, non-distended, normal bowel sounds Extremities: bilateral non-pitting edema noted.  Prior site of RLE venous stasis ulcer significantly improved, with no discharge, and only mild skin thickening and hyperpigmentation of the right posterior lower leg Neurologic: alert & oriented X3, cranial nerves II-XII grossly intact, strength grossly intact, sensation intact to light touch  Current Outpatient Prescriptions on File Prior to  Visit  Medication Sig Dispense Refill  . albuterol (PROVENTIL HFA;VENTOLIN HFA) 108 (90 BASE) MCG/ACT inhaler Inhale 2 puffs into the lungs every 6 (six) hours as needed for wheezing.  3 Inhaler  11  . amLODipine (NORVASC) 10 MG tablet TAKE 1 TABLET (10 MG TOTAL) BY MOUTH DAILY.  30 tablet  11  . aspirin 81 MG EC tablet Take 1 tablet (81 mg total) by mouth daily.  30 tablet  11  . bacitracin 500 UNIT/GM ointment Apply 1 application topically 2 (two) times daily.  453 g  0  . carvedilol (COREG) 25 MG tablet TAKE 1 TABLET (25 MG TOTAL) BY MOUTH 2 (TWO) TIMES DAILY.  60 tablet  11  . enalapril (VASOTEC) 20 MG tablet Take 2 tablets (40 mg total) by mouth daily.  60 tablet  11  . Fluticasone-Salmeterol (ADVAIR DISKUS) 100-50 MCG/DOSE AEPB Inhale 1 puff into the lungs 2 (two) times daily.  30 each  11  . furosemide (LASIX) 20 MG tablet Take 1 tablet (20 mg total) by mouth daily.  30 tablet  11  . gabapentin (NEURONTIN) 600 MG tablet Take 1 tablet (600 mg total) by mouth 2 (two) times daily.  180 tablet  3  . HYDROcodone-acetaminophen (NORCO) 10-325 MG per tablet Take 1 tablet by mouth every 6 (six) hours as needed for pain.  120 tablet  5  . hydrOXYzine (ATARAX/VISTARIL) 25 MG tablet Take 1 tablet (25 mg total) by mouth every 6 (six) hours as needed for itching.  20 tablet  0  . methylcellulose packet Take 1 each by mouth as needed for constipation.  50 each  5  . naproxen (NAPROSYN) 500 MG tablet  Take 1 tablet (500 mg total) by mouth daily as needed (arthritis pain).  30 tablet  0  . pantoprazole (PROTONIX) 20 MG tablet Take 1 tablet (20 mg total) by mouth daily.  30 tablet  11  . predniSONE (DELTASONE) 20 MG tablet Take 1 tablet (20 mg total) by mouth daily.  30 tablet  0  . rosuvastatin (CRESTOR) 10 MG tablet Take 1 tablet (10 mg total) by mouth daily.  90 tablet  3  . STOOL SOFTENER 250 MG capsule TAKE 1 CAPSULE (250 MG TOTAL) BY MOUTH DAILY.  30 capsule  6  . Tadalafil (CIALIS) 2.5 MG TABS Take 1  tablet (2.5 mg total) by mouth daily as needed for erectile dysfunction.  30 tablet  1  . triamcinolone cream (KENALOG) 0.1 % Apply topically 2 (two) times daily.  30 g  0  . VOLTAREN 1 % GEL APPLY 1 APPLICATION TOPICALLY 3 (THREE) TIMES DAILY AS NEEDED FOR JOINT PAIN.  200 g  6   No current facility-administered medications on file prior to visit.    Assessment/Plan

## 2012-12-05 NOTE — Patient Instructions (Signed)
General Instructions: Your itchy rash was likely due to Eczema.  I'm glad you've improved with the Prednisone tablets.  For any remaining rash, you may use Triamcinolone cream, twice per day to itchy areas.  Do not use for more than 2-3 weeks at a time.  Please return for a follow-up visit in 3 months.   Treatment Goals:  Goals (1 Years of Data) as of 12/05/12   None      Progress Toward Treatment Goals:  Treatment Goal 12/05/2012  Blood pressure at goal    Self Care Goals & Plans:  Self Care Goal 12/05/2012  Manage my medications take my medicines as prescribed; bring my medications to every visit  Monitor my health -  Eat healthy foods eat foods that are low in salt; eat baked foods instead of fried foods; eat fruit for snacks and desserts  Be physically active take a walk every day       Care Management & Community Referrals:  Referral 12/05/2012  Referrals made for care management support none needed

## 2012-12-05 NOTE — Assessment & Plan Note (Addendum)
The patient notes that his eczematous rash has improved after oral steroids.  Due to misunderstanding his prednisone dose, he has been out of this medication for the last 1-2 weeks, so we have missed the window to taper off of this medication.  Patient still with few residual patches of dry skin, significantly improved from prior. -use triamcinolone 0.1% cream BID for remaining pruritic patches, for the next 2-3 weeks -apply topical lotion to the area daily as well -no steroid taper needed, as pt has been out of prednisone x1-2 weeks

## 2012-12-06 NOTE — Progress Notes (Signed)
Case discussed with Dr. Brown at the time of the visit.  We reviewed the resident's history and exam and pertinent patient test results.  I agree with the assessment, diagnosis, and plan of care documented in the resident's note. 

## 2012-12-16 NOTE — Addendum Note (Signed)
Addended by: Neomia Dear on: 12/16/2012 01:41 PM   Modules accepted: Orders

## 2013-02-05 ENCOUNTER — Other Ambulatory Visit: Payer: Self-pay | Admitting: *Deleted

## 2013-02-05 MED ORDER — TRIAMCINOLONE ACETONIDE 0.1 % EX CREA: TOPICAL_CREAM | Freq: Two times a day (BID) | CUTANEOUS | Status: DC

## 2013-02-06 ENCOUNTER — Encounter: Payer: Self-pay | Admitting: Internal Medicine

## 2013-02-06 ENCOUNTER — Ambulatory Visit (INDEPENDENT_AMBULATORY_CARE_PROVIDER_SITE_OTHER): Payer: PRIVATE HEALTH INSURANCE | Admitting: Internal Medicine

## 2013-02-06 VITALS — BP 156/84 | HR 64 | Temp 98.7°F | Ht 71.0 in | Wt 246.3 lb

## 2013-02-06 DIAGNOSIS — R609 Edema, unspecified: Secondary | ICD-10-CM

## 2013-02-06 DIAGNOSIS — L259 Unspecified contact dermatitis, unspecified cause: Secondary | ICD-10-CM

## 2013-02-06 DIAGNOSIS — L309 Dermatitis, unspecified: Secondary | ICD-10-CM

## 2013-02-06 DIAGNOSIS — M48061 Spinal stenosis, lumbar region without neurogenic claudication: Secondary | ICD-10-CM

## 2013-02-06 DIAGNOSIS — Z23 Encounter for immunization: Secondary | ICD-10-CM

## 2013-02-06 DIAGNOSIS — I10 Essential (primary) hypertension: Secondary | ICD-10-CM

## 2013-02-06 DIAGNOSIS — Z0000 Encounter for general adult medical examination without abnormal findings: Secondary | ICD-10-CM

## 2013-02-06 MED ORDER — TRIAMCINOLONE ACETONIDE 0.1 % EX CREA: TOPICAL_CREAM | Freq: Two times a day (BID) | CUTANEOUS | Status: DC

## 2013-02-06 MED ORDER — HYDROCODONE-ACETAMINOPHEN 10-325 MG PO TABS: 1.0000 | ORAL_TABLET | Freq: Four times a day (QID) | ORAL | Status: DC | PRN

## 2013-02-06 MED ORDER — AMLODIPINE BESYLATE 10 MG PO TABS: ORAL_TABLET | ORAL | Status: DC

## 2013-02-06 NOTE — Patient Instructions (Signed)
General Instructions: We have refilled your steroid cream, blood pressure medications, and hydrocodone today.  You have received a flu shot today.  It is normal to experience some soreness in your arm for a few days.  Please return for a follow-up visit in 6 months.   Treatment Goals:  Goals (1 Years of Data) as of 02/06/13   None      Progress Toward Treatment Goals:  Treatment Goal 02/06/2013  Blood pressure unchanged    Self Care Goals & Plans:  Self Care Goal 12/05/2012  Manage my medications take my medicines as prescribed; bring my medications to every visit  Monitor my health -  Eat healthy foods eat foods that are low in salt; eat baked foods instead of fried foods; eat fruit for snacks and desserts  Be physically active take a walk every day       Care Management & Community Referrals:  Referral 02/06/2013  Referrals made for care management support none needed

## 2013-02-06 NOTE — Assessment & Plan Note (Signed)
Well-controlled with compression stockings and Lasix

## 2013-02-06 NOTE — Assessment & Plan Note (Signed)
The patient continues to note chronic lower back pain, but he notes that this pain is controlled by hydrocodone. Physical exam reveals no vertebral or paraspinal tenderness to palpation. -Continue current pain medications

## 2013-02-06 NOTE — Assessment & Plan Note (Signed)
Flu shot given today

## 2013-02-06 NOTE — Progress Notes (Signed)
HPI The patient is a 78 y.o. male with a history of blindness, hypertension, diastolic CHF, eczema, returning for a followup visit  The patient has a history of eczema, CBC with severe lesions on his palms requiring a course of oral steroids. Since that time his palmar eczema has significantly improved, but he still notes occasional patches of dry itchy skin on his bilateral lower extremities. He has been using triamcinolone cream occasionally for this, using it for no more than 1-2 weeks at a time followed by a one to two-week break.  The patient notes chronic low back pain, which has been mildly worse for the last 2-3 weeks, and which the patient attributes to a bad mattress.  No heavy lifting, fall, or trauma.  Pain came on gradually.  Pain is worse in the morning, and improves after moving around.  The patient uses hydrocodone for arthritis, which helps his back pain as well.  The patient wants a flu shot.  The patient's blood pressure is mildly elevated today, but he does note running out of his amlodipine medication recently.  ROS: General: no fevers, chills, changes in weight, changes in appetite Skin: no rash HEENT: no blurry vision, hearing changes, sore throat Pulm: +chronic cough, no dyspnea, wheezing CV: no chest pain, palpitations, shortness of breath Abd: no abdominal pain, nausea/vomiting, diarrhea/constipation GU: no dysuria, hematuria, polyuria Ext: no arthralgias, myalgias Neuro: no weakness, numbness, or tingling  Filed Vitals:   02/06/13 0930  BP: 156/84  Pulse: 64  Temp: 98.7 F (37.1 C)    PEX General: alert, cooperative, and in no apparent distress HEENT: pupils equal round and reactive to light, vision grossly intact, oropharynx clear and non-erythematous  Neck: supple, no lymphadenopathy Lungs: clear to ascultation bilaterally, normal work of respiration, no wheezes, rales, ronchi Heart: regular rate and rhythm, no murmurs, gallops, or rubs Abdomen: soft,  non-tender, non-distended, normal bowel sounds Back: No vertebral or paraspinal tenderness to palpation noted Extremities: Bilateral anterior lower legs with confluent patches of dry, scaly skin.  Neurologic: alert & oriented X3, cranial nerves II-XII intact, strength grossly intact, sensation intact to light touch  Current Outpatient Prescriptions on File Prior to Visit  Medication Sig Dispense Refill  . albuterol (PROVENTIL HFA;VENTOLIN HFA) 108 (90 BASE) MCG/ACT inhaler Inhale 2 puffs into the lungs every 6 (six) hours as needed for wheezing.  3 Inhaler  11  . amLODipine (NORVASC) 10 MG tablet TAKE 1 TABLET (10 MG TOTAL) BY MOUTH DAILY.  30 tablet  11  . aspirin 81 MG EC tablet Take 1 tablet (81 mg total) by mouth daily.  30 tablet  11  . bacitracin 500 UNIT/GM ointment Apply 1 application topically 2 (two) times daily.  453 g  0  . carvedilol (COREG) 25 MG tablet TAKE 1 TABLET (25 MG TOTAL) BY MOUTH 2 (TWO) TIMES DAILY.  60 tablet  11  . enalapril (VASOTEC) 20 MG tablet Take 2 tablets (40 mg total) by mouth daily.  60 tablet  11  . Fluticasone-Salmeterol (ADVAIR DISKUS) 100-50 MCG/DOSE AEPB Inhale 1 puff into the lungs 2 (two) times daily.  30 each  11  . furosemide (LASIX) 20 MG tablet Take 1 tablet (20 mg total) by mouth daily.  30 tablet  11  . gabapentin (NEURONTIN) 600 MG tablet Take 1 tablet (600 mg total) by mouth 2 (two) times daily.  180 tablet  3  . HYDROcodone-acetaminophen (NORCO) 10-325 MG per tablet Take 1 tablet by mouth every 6 (six) hours  as needed for pain.  120 tablet  5  . hydrOXYzine (ATARAX/VISTARIL) 25 MG tablet Take 1 tablet (25 mg total) by mouth every 6 (six) hours as needed for itching.  20 tablet  0  . methylcellulose packet Take 1 each by mouth as needed for constipation.  50 each  5  . naproxen (NAPROSYN) 500 MG tablet Take 1 tablet (500 mg total) by mouth daily as needed (arthritis pain).  30 tablet  2  . pantoprazole (PROTONIX) 20 MG tablet Take 1 tablet (20 mg  total) by mouth daily.  30 tablet  11  . predniSONE (DELTASONE) 20 MG tablet Take 1 tablet (20 mg total) by mouth daily.  30 tablet  0  . rosuvastatin (CRESTOR) 10 MG tablet Take 1 tablet (10 mg total) by mouth daily.  90 tablet  3  . STOOL SOFTENER 250 MG capsule TAKE 1 CAPSULE (250 MG TOTAL) BY MOUTH DAILY.  30 capsule  6  . Tadalafil (CIALIS) 2.5 MG TABS Take 1 tablet (2.5 mg total) by mouth daily as needed for erectile dysfunction.  30 tablet  1  . triamcinolone cream (KENALOG) 0.1 % Apply topically 2 (two) times daily. To itchy areas of arms and legs.  Use for only 2-3 weeks at a time.  85.2 g  1  . VOLTAREN 1 % GEL APPLY 1 APPLICATION TOPICALLY 3 (THREE) TIMES DAILY AS NEEDED FOR JOINT PAIN.  200 g  6   No current facility-administered medications on file prior to visit.    Assessment/Plan

## 2013-02-06 NOTE — Assessment & Plan Note (Signed)
BP Readings from Last 3 Encounters:  02/06/13 156/84  12/05/12 130/60  11/11/12 149/73    Lab Results  Component Value Date   NA 140 11/11/2012   K 3.5 11/11/2012   CREATININE 1.12 11/11/2012    Assessment: Blood pressure control: mildly elevated Progress toward BP goal:  unchanged Comments: Blood pressure mildly elevated in the setting of running out of amlodipine.  Plan: Medications:  Amlodipine refilled, continue remaining blood pressure medications Educational resources provided:   Self management tools provided:   Other plans: Recheck at next visit.

## 2013-02-06 NOTE — Assessment & Plan Note (Signed)
The patient's eczema has significantly improved, though still with mild recurrence of symptoms. -Continue to use triamcinolone cream as needed, for no more than 1-2 weeks at the time.

## 2013-02-07 NOTE — Progress Notes (Signed)
Case discussed with Dr. Brown at the time of the visit.  We reviewed the resident's history and exam and pertinent patient test results.  I agree with the assessment, diagnosis, and plan of care documented in the resident's note. 

## 2013-02-22 ENCOUNTER — Other Ambulatory Visit: Payer: Self-pay | Admitting: Internal Medicine

## 2013-02-28 ENCOUNTER — Telehealth: Payer: Self-pay | Admitting: *Deleted

## 2013-02-28 ENCOUNTER — Other Ambulatory Visit: Payer: Self-pay | Admitting: Internal Medicine

## 2013-02-28 MED ORDER — HYDROCODONE-ACETAMINOPHEN 10-325 MG PO TABS: 1.0000 | ORAL_TABLET | Freq: Four times a day (QID) | ORAL | Status: DC | PRN

## 2013-03-03 NOTE — Telephone Encounter (Signed)
Pt's friend, Rowan Blase, brought back previous rx for Norco which stated do not fill until Nov and it was destroyed (witnessed by Venita Sheffield H.) and correct rxs x 3 given to her. Dr Manson Passey awared.

## 2013-03-26 ENCOUNTER — Telehealth: Payer: Self-pay | Admitting: Internal Medicine

## 2013-03-26 NOTE — Telephone Encounter (Signed)
The patient calls, and notes occasional difficulty voiding fully.  He notes no dysuria.  He asks if there is anything we can do for this.  I encouraged the patient to present for evaluation of this problem.  It sounds like potential BPH, and he may benefit from flomax or similar medication, but I'd like to examine him and get a full history before committing to a course of action such as this.  Will send message to front desk to call patient for an appointment at his convenience.

## 2013-04-02 ENCOUNTER — Encounter: Payer: PRIVATE HEALTH INSURANCE | Admitting: Internal Medicine

## 2013-04-10 ENCOUNTER — Other Ambulatory Visit: Payer: Self-pay | Admitting: Internal Medicine

## 2013-04-28 ENCOUNTER — Encounter: Payer: Self-pay | Admitting: Internal Medicine

## 2013-04-28 ENCOUNTER — Ambulatory Visit (INDEPENDENT_AMBULATORY_CARE_PROVIDER_SITE_OTHER): Payer: PRIVATE HEALTH INSURANCE | Admitting: Internal Medicine

## 2013-04-28 VITALS — BP 126/59 | HR 68 | Temp 98.0°F | Ht 71.0 in | Wt 246.9 lb

## 2013-04-28 DIAGNOSIS — I10 Essential (primary) hypertension: Secondary | ICD-10-CM

## 2013-04-28 DIAGNOSIS — M25461 Effusion, right knee: Secondary | ICD-10-CM

## 2013-04-28 DIAGNOSIS — M48061 Spinal stenosis, lumbar region without neurogenic claudication: Secondary | ICD-10-CM

## 2013-04-28 MED ORDER — HYDROCODONE-ACETAMINOPHEN 10-325 MG PO TABS: 1.0000 | ORAL_TABLET | Freq: Four times a day (QID) | ORAL | Status: DC | PRN

## 2013-04-28 MED ORDER — HYDROCODONE-ACETAMINOPHEN 10-325 MG PO TABS
1.0000 | ORAL_TABLET | Freq: Four times a day (QID) | ORAL | Status: DC | PRN
Start: 2013-04-28 — End: 2013-04-28

## 2013-04-28 MED ORDER — MELOXICAM 7.5 MG PO TABS: 7.5000 mg | ORAL_TABLET | Freq: Every day | ORAL | Status: DC

## 2013-04-28 NOTE — Progress Notes (Signed)
HPI The patient is a 78 y.o. male with a history of blindness, HTN, chronic back/hip pain, COPD, presenting for a follow-up visit for chronic back pain.  The patient has a history of chronic back pain, with lumbar spine x-ray from 02/2007 showing diffuse degenerative disc disease and spondylosis throughout lumbar spine (also seen by CT 06/2005).  The patient notes that his chronic pain is mildly worse over the last 6 months.  The patient has been taking Hydrocodone-acetaminophen 10-325, #120/month, about 4 tabs/day.  Previously, we have tried trials of naproxen, which have been helpful, though he is not currently taking this medication.  Pain currently a 5/10.  The patient's BP was elevated at his last visit, after running out of amlodipine.  BP is well-controlled today.  The patient notes a mechanical fall 2 weeks ago, in which he tried walking with socks on a wood floor, slipped, and landed on his buttocks.  He notes no lasting injury from the fall.  However, he notes painless left knee swelling, which has persisted.  Of note, and a recent phone encounter, the patient noted incomplete voiding, without dysuria. He notes that he is taking cranberry tablets, and this has completely resolved the issue. He presently notes no dysuria, urinary frequency, urinary urgency, or suprapubic pain.  ROS: General: no fevers, chills, changes in weight, changes in appetite Skin: no rash HEENT: no blurry vision, hearing changes, sore throat Pulm: no dyspnea, coughing, wheezing CV: no chest pain, palpitations, shortness of breath Abd: no abdominal pain, nausea/vomiting, diarrhea/constipation GU: no dysuria, hematuria, polyuria Ext: see HPI Neuro: no weakness, numbness, or tingling  Filed Vitals:   04/28/13 1113  BP: 126/59  Pulse: 68  Temp: 98 F (36.7 C)    PEX General: alert, cooperative, and in no apparent distress HEENT: pupils equal round and reactive to light, vision grossly intact, oropharynx  clear and non-erythematous  Neck: supple Lungs: clear to ascultation bilaterally, normal work of respiration, no wheezes, rales, ronchi Heart: regular rate and rhythm, no murmurs, gallops, or rubs Abdomen: soft, non-tender, non-distended, normal bowel sounds Back: Mild tenderness to low back palpation Extremities: Right knee with palpable effusion, though with no erythema, pain, or warmth Neurologic: alert & oriented X3, cranial nerves II-XII intact, strength grossly intact, sensation intact to light touch  Current Outpatient Prescriptions on File Prior to Visit  Medication Sig Dispense Refill  . albuterol (PROVENTIL HFA;VENTOLIN HFA) 108 (90 BASE) MCG/ACT inhaler Inhale 2 puffs into the lungs every 6 (six) hours as needed for wheezing.  3 Inhaler  11  . amLODipine (NORVASC) 10 MG tablet TAKE 1 TABLET (10 MG TOTAL) BY MOUTH DAILY.  30 tablet  11  . aspirin 81 MG EC tablet Take 1 tablet (81 mg total) by mouth daily.  30 tablet  11  . bacitracin 500 UNIT/GM ointment Apply 1 application topically 2 (two) times daily.  453 g  0  . carvedilol (COREG) 25 MG tablet TAKE 1 TABLET (25 MG TOTAL) BY MOUTH 2 (TWO) TIMES DAILY.  60 tablet  11  . enalapril (VASOTEC) 20 MG tablet Take 2 tablets (40 mg total) by mouth daily.  60 tablet  11  . Fluticasone-Salmeterol (ADVAIR DISKUS) 100-50 MCG/DOSE AEPB Inhale 1 puff into the lungs 2 (two) times daily.  30 each  11  . furosemide (LASIX) 20 MG tablet Take 1 tablet (20 mg total) by mouth daily.  30 tablet  11  . gabapentin (NEURONTIN) 600 MG tablet Take 1 tablet (600 mg total)  by mouth 2 (two) times daily.  180 tablet  3  . HYDROcodone-acetaminophen (NORCO) 10-325 MG per tablet Take 1 tablet by mouth every 6 (six) hours as needed for pain.  120 tablet  0  . hydrOXYzine (ATARAX/VISTARIL) 25 MG tablet Take 1 tablet (25 mg total) by mouth every 6 (six) hours as needed for itching.  20 tablet  0  . methylcellulose packet Take 1 each by mouth as needed for  constipation.  50 each  5  . naproxen (NAPROSYN) 500 MG tablet TAKE 1 TABLET (500 MG TOTAL) BY MOUTH DAILY AS NEEDED (ARTHRITIS PAIN).  30 tablet  3  . pantoprazole (PROTONIX) 20 MG tablet Take 1 tablet (20 mg total) by mouth daily.  30 tablet  11  . predniSONE (DELTASONE) 20 MG tablet Take 1 tablet (20 mg total) by mouth daily.  30 tablet  0  . rosuvastatin (CRESTOR) 10 MG tablet Take 1 tablet (10 mg total) by mouth daily.  90 tablet  3  . STOOL SOFTENER 250 MG capsule TAKE 1 CAPSULE (250 MG TOTAL) BY MOUTH DAILY.  30 capsule  6  . Tadalafil (CIALIS) 2.5 MG TABS Take 1 tablet (2.5 mg total) by mouth daily as needed for erectile dysfunction.  30 tablet  1  . triamcinolone cream (KENALOG) 0.1 % Apply topically 2 (two) times daily. To itchy areas of arms and legs.  Use for only 2-3 weeks at a time.  85.2 g  4  . VOLTAREN 1 % GEL APPLY 3 TIMES DAILY AS NEEDED FOR JOINT PAIN  200 g  6   No current facility-administered medications on file prior to visit.    Assessment/Plan

## 2013-04-28 NOTE — Assessment & Plan Note (Signed)
BP Readings from Last 3 Encounters:  04/28/13 126/59  02/06/13 156/84  12/05/12 130/60    Lab Results  Component Value Date   NA 140 11/11/2012   K 3.5 11/11/2012   CREATININE 1.12 11/11/2012    Assessment: Blood pressure control: controlled Progress toward BP goal:  at goal Comments: Blood pressure is now well controlled.  Plan: Medications:  Continue amlodipine, enalapril, carvedilol, Lasix Educational resources provided:   Self management tools provided:   Other plans: Recheck at next visit

## 2013-04-28 NOTE — Assessment & Plan Note (Signed)
The patient notes painless right knee effusion after a mechanical fall 2 weeks ago. On physical exam, patient's effusion is present. I suspect that this is secondary to trauma, and should resolve spontaneously. If the effusion persists, we can consider arthrocentesis at his next visit.

## 2013-04-28 NOTE — Patient Instructions (Signed)
General Instructions: Your low back pain is due to "degenerative disc disease".  This is a chronic issue, though we can manage your pain with medications. -STOP taking Naproxen -start taking Meloxicam, 1 tablet once per day -you may continue to take hydrocodone-acetaminophen as prescribed  Your left knee swelling should improve with time.  If it does not, we can drain it at your next visit.  Please return for a follow-up visit in about 3-4 months.  Treatment Goals:  Goals (1 Years of Data) as of 04/28/13   None      Progress Toward Treatment Goals:  Treatment Goal 04/28/2013  Blood pressure at goal    Self Care Goals & Plans:  Self Care Goal 04/28/2013  Manage my medications take my medicines as prescribed; bring my medications to every visit; refill my medications on time  Monitor my health -  Eat healthy foods eat foods that are low in salt; eat baked foods instead of fried foods  Be physically active take a walk every day    No flowsheet data found.   Care Management & Community Referrals:  Referral 04/28/2013  Referrals made for care management support none needed

## 2013-04-28 NOTE — Assessment & Plan Note (Signed)
The patient has a history of chronic back pain, due to degenerative disc disease. -Continue hydrocodone acetaminophen as prescribed, #120 per month -Try a trial of meloxicam, 7.5 mg daily when necessary for additional short term pain relief

## 2013-04-28 NOTE — Progress Notes (Signed)
Case discussed with Dr. Brown at the time of the visit.  We reviewed the resident's history and exam and pertinent patient test results.  I agree with the assessment, diagnosis and plan of care documented in the resident's note. 

## 2013-05-13 ENCOUNTER — Ambulatory Visit (INDEPENDENT_AMBULATORY_CARE_PROVIDER_SITE_OTHER): Payer: PRIVATE HEALTH INSURANCE | Admitting: Internal Medicine

## 2013-05-13 ENCOUNTER — Encounter: Payer: Self-pay | Admitting: Internal Medicine

## 2013-05-13 VITALS — BP 185/81 | HR 62 | Temp 97.8°F | Wt 245.4 lb

## 2013-05-13 DIAGNOSIS — I10 Essential (primary) hypertension: Secondary | ICD-10-CM

## 2013-05-13 DIAGNOSIS — N508 Other specified disorders of male genital organs: Secondary | ICD-10-CM

## 2013-05-13 DIAGNOSIS — N5089 Other specified disorders of the male genital organs: Secondary | ICD-10-CM | POA: Insufficient documentation

## 2013-05-13 MED ORDER — AMLODIPINE BESYLATE 10 MG PO TABS: ORAL_TABLET | ORAL | Status: DC

## 2013-05-13 NOTE — Assessment & Plan Note (Addendum)
Painless soft tissue right testicular mass (no translucence) palpable on exam without surrounding erythema or fluctuance to suggest infection.  Differential includes varicocele, cyst, hydrocele and testicular cancer (though rare).  Not painful, so less concerning for torsion.  Etiology may be dCHF, but less likely given it is unilateral and does not appear fluid overloaded on exam (specifically reports that leg edema has improved - likely because he is no longer taking amlodipine as he thought it was d/c).  Mass not reducible into inguinal canal & no extrusion on coughing, so less likely hernia.  -Start with scrotal US, may need to consider further testing with bHCG, AFP if [place] suggestive of malignancy, RTC to discuss results.  ADDENDUM: [place] results: Enlarged right epididymis with increased flow to both the right epididymis and right testicle - most compatible with epididymo-orchitis. Recommend clinical followup. Bilateral hydroceles, moderate on the right and small on the left.  PLAN: Patient to return to clinic on 05/22/13 for UA/micro, HIV ab testing, urine GC/C, and Ceftriaxone 250mg  IM x 1; he will also start doxy 100mg  BID tomorrow for 10d; if symptoms do not abate, may need to consider surgical excision of hydrocele

## 2013-05-13 NOTE — Patient Instructions (Signed)
General Instructions:  -Please have a scrotal ultrasound done so we can further evaluate the etiology of your scrotal swelling -You do need to be taking amlodipine 10mg  daily - I have resent this medication to your pharmacy  Please be sure to bring all of your medications with you to every visit.  Should you have any new or worsening symptoms, please be sure to call the clinic at [phone].   Progress Toward Treatment Goals:  Treatment Goal 05/13/2013  Blood pressure deteriorated    Self Care Goals & Plans:  Self Care Goal 04/28/2013  Manage my medications take my medicines as prescribed; bring my medications to every visit; refill my medications on time  Monitor my health -  Eat healthy foods eat foods that are low in salt; eat baked foods instead of fried foods  Be physically active take a walk every day    No flowsheet data found.   Care Management & Community Referrals:  Referral 04/28/2013  Referrals made for care management support none needed

## 2013-05-13 NOTE — Assessment & Plan Note (Signed)
BP Readings from Last 3 Encounters:  05/13/13 185/81  04/28/13 126/59  02/06/13 156/84    Lab Results  Component Value Date   NA 140 11/11/2012   K 3.5 11/11/2012   CREATININE 1.12 11/11/2012    Assessment: Blood pressure control: moderately elevated Progress toward BP goal:  deteriorated Comments: patient thought amlodipine was d/c, he has been compliant with coreg, enalapril & lasix  Plan: Medications:  Restart amlodipine (discontinuation of this is likely why LE edema resolved), cont coreg, lasix and enalapril Other plans: return in 1 week for BP check
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
## 2013-05-16 ENCOUNTER — Ambulatory Visit (HOSPITAL_COMMUNITY)
Admission: RE | Admit: 2013-05-16 | Discharge: 2013-05-16 | Disposition: A | Discharge status: Home / Self Care | Payer: PRIVATE HEALTH INSURANCE | Source: Ambulatory Visit | Attending: Internal Medicine | Admitting: Internal Medicine

## 2013-05-16 DIAGNOSIS — N5089 Other specified disorders of the male genital organs: Secondary | ICD-10-CM

## 2013-05-16 DIAGNOSIS — N508 Other specified disorders of male genital organs: Secondary | ICD-10-CM | POA: Insufficient documentation

## 2013-05-16 DIAGNOSIS — N433 Hydrocele, unspecified: Secondary | ICD-10-CM | POA: Insufficient documentation

## 2013-05-21 MED ORDER — DOXYCYCLINE HYCLATE 100 MG PO CAPS: 100.0000 mg | ORAL_CAPSULE | Freq: Two times a day (BID) | ORAL | Status: DC

## 2013-05-21 MED ORDER — CEFTRIAXONE SODIUM 1 G IJ SOLR
250.0000 mg | Freq: Once | INTRAMUSCULAR | Status: AC
  Administered 2013-05-22: 250 mg via INTRAMUSCULAR

## 2013-05-21 NOTE — Addendum Note (Signed)
Addended by: Othella Boyer on: 05/21/2013 10:09 AM   Modules accepted: Orders

## 2013-05-22 ENCOUNTER — Other Ambulatory Visit (INDEPENDENT_AMBULATORY_CARE_PROVIDER_SITE_OTHER): Payer: PRIVATE HEALTH INSURANCE

## 2013-05-22 ENCOUNTER — Ambulatory Visit: Payer: PRIVATE HEALTH INSURANCE | Admitting: *Deleted

## 2013-05-22 ENCOUNTER — Other Ambulatory Visit (HOSPITAL_COMMUNITY)
Admission: RE | Admit: 2013-05-22 | Discharge: 2013-05-22 | Disposition: A | Discharge status: Home / Self Care | Payer: PRIVATE HEALTH INSURANCE | Source: Ambulatory Visit | Attending: Internal Medicine | Admitting: Internal Medicine

## 2013-05-22 DIAGNOSIS — N508 Other specified disorders of male genital organs: Secondary | ICD-10-CM

## 2013-05-22 DIAGNOSIS — N5089 Other specified disorders of the male genital organs: Secondary | ICD-10-CM

## 2013-05-22 DIAGNOSIS — Z113 Encounter for screening for infections with a predominantly sexual mode of transmission: Secondary | ICD-10-CM | POA: Insufficient documentation

## 2013-05-22 LAB — HIV ANTIBODY (ROUTINE TESTING W REFLEX): HIV: NONREACTIVE

## 2013-05-22 NOTE — Progress Notes (Signed)
Injection given as ordered.  Pt waited post injection.  No reaction noted

## 2013-05-23 LAB — URINALYSIS, ROUTINE W REFLEX MICROSCOPIC
GLUCOSE, UA: NEGATIVE mg/dL
HGB URINE DIPSTICK: NEGATIVE
Ketones, ur: NEGATIVE mg/dL
Leukocytes, UA: NEGATIVE
Nitrite: NEGATIVE
PROTEIN: NEGATIVE mg/dL
Specific Gravity, Urine: 1.023 (ref 1.005–1.030)
Urobilinogen, UA: 1 mg/dL (ref 0.0–1.0)
pH: 6 (ref 5.0–8.0)

## 2013-05-31 NOTE — Progress Notes (Signed)
I saw and evaluated the patient.  I personally confirmed the key portions of Dr. Raenette Rover history and exam and reviewed pertinent patient test results.  The assessment, diagnosis, and plan were formulated together and I agree with the documentation in the resident's note.

## 2013-06-06 ENCOUNTER — Encounter: Payer: Self-pay | Admitting: Internal Medicine

## 2013-06-06 ENCOUNTER — Ambulatory Visit (INDEPENDENT_AMBULATORY_CARE_PROVIDER_SITE_OTHER): Payer: PRIVATE HEALTH INSURANCE | Admitting: Internal Medicine

## 2013-06-06 VITALS — BP 131/70 | HR 66 | Temp 98.3°F | Ht 71.0 in | Wt 246.1 lb

## 2013-06-06 DIAGNOSIS — I10 Essential (primary) hypertension: Secondary | ICD-10-CM

## 2013-06-06 DIAGNOSIS — N5089 Other specified disorders of the male genital organs: Secondary | ICD-10-CM
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
## 2013-06-06 NOTE — Assessment & Plan Note (Addendum)
Pt comes in for a f/u visit for orchitis/hydrocele.  He completed a course of doxy 10days and injection of rocephin.  He denies any fever/chills, scrotal pain, erythema, skin breakdown, SOB, CP, N/V/D/C.  He states the swelling has only gone down slightly.  He reports elevating the scrotum and using ice packs.  He is requesting additional antibiotics for swelling.  He does additionally have bilateral hydroceles which are benign and would rarely require surgical intervention.  -continue conservative treatments including scrotal elevation and ice packs -f/u in 1 month for continued improvement in swelling -advised pt to be aware of worsening symptoms including increased pain, swelling, redness, warmth, skin breakdown

## 2013-06-06 NOTE — Patient Instructions (Addendum)
Thank you for your visit today. Please return to the internal medicine clinic in 1 month or sooner if needed for a recheck of your scrotal swelling.   Continue to elevate the scrotum and use an ice pack to decrease the swelling.   It may take some time for the swelling to decrease. Your current medical regimen is effective;  continue present plan and all medications. Please be sure to bring all of your medications with you to every visit.  Should you have any new or worsening symptoms, please be sure to call the clinic at [phone].  If you believe that you are suffering from a life threatening condition or one that may result in the loss of limb or function, then you should call 911 or proceed to the nearest Emergency Department.

## 2013-06-06 NOTE — Assessment & Plan Note (Signed)
Hypertension well controlled today BP: 131/70 mmHg   -continue current management

## 2013-06-09 NOTE — Progress Notes (Signed)
Case discussed with Dr. Gordy Levan at the time of the visit.  We reviewed the resident's history and exam and pertinent patient test results.  I agree with the assessment, diagnosis, and plan of care documented in the resident's note.

## 2013-07-18 ENCOUNTER — Other Ambulatory Visit: Payer: Self-pay | Admitting: Internal Medicine

## 2013-08-21 ENCOUNTER — Other Ambulatory Visit: Payer: Self-pay | Admitting: Internal Medicine

## 2013-08-25 ENCOUNTER — Other Ambulatory Visit: Payer: Self-pay | Admitting: *Deleted

## 2013-08-25 ENCOUNTER — Other Ambulatory Visit: Payer: Self-pay | Admitting: Internal Medicine

## 2013-08-26 MED ORDER — HYDROCODONE-ACETAMINOPHEN 10-325 MG PO TABS: 1.0000 | ORAL_TABLET | Freq: Four times a day (QID) | ORAL | Status: DC | PRN

## 2013-08-29 ENCOUNTER — Ambulatory Visit (INDEPENDENT_AMBULATORY_CARE_PROVIDER_SITE_OTHER): Payer: PRIVATE HEALTH INSURANCE | Admitting: Internal Medicine

## 2013-08-29 ENCOUNTER — Encounter: Payer: Self-pay | Admitting: Internal Medicine

## 2013-08-29 VITALS — BP 135/68 | HR 67 | Temp 97.5°F | Ht 71.0 in | Wt 256.0 lb

## 2013-08-29 DIAGNOSIS — R238 Other skin changes: Secondary | ICD-10-CM

## 2013-08-29 DIAGNOSIS — L309 Dermatitis, unspecified: Secondary | ICD-10-CM

## 2013-08-29 DIAGNOSIS — T148XXA Other injury of unspecified body region, initial encounter: Secondary | ICD-10-CM | POA: Insufficient documentation

## 2013-08-29 DIAGNOSIS — L259 Unspecified contact dermatitis, unspecified cause: Secondary | ICD-10-CM

## 2013-08-29 DIAGNOSIS — I10 Essential (primary) hypertension: Secondary | ICD-10-CM

## 2013-08-29 MED ORDER — SULFAMETHOXAZOLE-TMP DS 800-160 MG PO TABS: 1.0000 | ORAL_TABLET | Freq: Two times a day (BID) | ORAL | Status: DC

## 2013-08-29 NOTE — Assessment & Plan Note (Signed)
BP Readings from Last 3 Encounters:  08/29/13 135/68  06/06/13 131/70  05/13/13 185/81    Lab Results  Component Value Date   NA 140 11/11/2012   K 3.5 11/11/2012   CREATININE 1.12 11/11/2012    Assessment: Blood pressure control: controlled Progress toward BP goal:  at goal Comments: none  Plan: Medications:  continue current medications Educational resources provided: other (see comments);brochure;handout;video Self management tools provided: other (see comments) Other plans: f/u with PCP

## 2013-08-29 NOTE — Progress Notes (Addendum)
   Subjective:    Patient ID: Nicholas [name], male    DOB: 1934/10/09, 79 y.o.   MRN: [id number]  HPI Comments: 79 y.o Past Medical History Gout, eczema, GERD, Hypertension (BP 135/68), Blind, Hyperlipidemia, venous stasis dermatitis, COPD, Osteoarthritis, ERECTILE DYSFUNCTION, SPINAL STENOSIS (LUMBAR), chronic diastolic congestive heart failure (dys 1), EF 55% 12/2011   He presents for: BP 135/68 HR 67 1) Boil on leg x 1-2 months, Boils are all over body head to toe. He denies pain they are itching. He pulled a plug out of the boil on the left hip.   No one else is itching at home.  He has recently changed laundry detergents to gain instead of Armor Hammer        Review of Systems  Constitutional: Negative for fever and chills.  Respiratory: Negative for shortness of breath.   Cardiovascular: Negative for chest pain.  Skin: Positive for wound.       +boils to legs, chest, back        Objective:   Physical Exam  Nursing note and vitals reviewed. Constitutional: He is oriented to person, place, and time. Vital signs are normal. He appears well-developed and well-nourished. He is cooperative.  HENT:  Head: Normocephalic and atraumatic.  Mouth/Throat: Oropharynx is clear and moist and mucous membranes are normal. Abnormal dentition. No oropharyngeal exudate.  Eyes: Conjunctivae are normal. Pupils are equal, round, and reactive to light. Right eye exhibits no discharge. Left eye exhibits no discharge. No scleral icterus.  Cardiovascular: Normal rate, regular rhythm, S1 normal, S2 normal and normal heart sounds.   No murmur heard. No lower ext edema  Pulmonary/Chest: Effort normal and breath sounds normal.  Fines papules/pustules to chest and back   Musculoskeletal: He exhibits no edema.  Neurological: He is alert and oriented to person, place, and time.  In wheelchair today  Skin: Skin is warm and dry. No rash noted.     eczematous changes to chest, back, legs   Psychiatric: He  has a normal mood and affect. His speech is normal and behavior is normal. Judgment and thought content normal. Cognition and memory are normal.          Assessment & Plan:  F/u in 2 weeks prn

## 2013-08-29 NOTE — Patient Instructions (Addendum)
General Instructions: Please follow up in 1-2 weeks, sooner if needed  Read all the information below  Please follow up with wound center    Treatment Goals:  Goals (1 Years of Data) as of 08/29/13         06/06/13 05/13/13 05/13/13 04/28/13 02/06/13     Blood Pressure    . Blood Pressure < 140/90  131/70 185/81 191/81 126/59 156/84      Progress Toward Treatment Goals:  Treatment Goal 08/29/2013  Blood pressure at goal    Self Care Goals & Plans:  Self Care Goal 08/29/2013  Manage my medications take my medicines as prescribed; bring my medications to every visit; refill my medications on time  Monitor my health keep track of my blood pressure  Eat healthy foods drink diet soda or water instead of juice or soda; eat more vegetables; eat foods that are low in salt; eat baked foods instead of fried foods; eat fruit for snacks and desserts; eat smaller portions  Be physically active find an activity I enjoy  Meeting treatment goals maintain the current self-care plan    No flowsheet data found.   Care Management & Community Referrals:  Referral 08/29/2013  Referrals made for care management support none needed  Referrals made to community resources none       Abscess An abscess is an infected area that contains a collection of pus and debris.It can occur in almost any part of the body. An abscess is also known as a furuncle or boil. CAUSES  An abscess occurs when tissue gets infected. This can occur from blockage of oil or sweat glands, infection of hair follicles, or a minor injury to the skin. As the body tries to fight the infection, pus collects in the area and creates pressure under the skin. This pressure causes pain. People with weakened immune systems have difficulty fighting infections and get certain abscesses more often.  SYMPTOMS Usually an abscess develops on the skin and becomes a painful mass that is red, warm, and tender. If the abscess forms under the skin, you  may feel a moveable soft area under the skin. Some abscesses break open (rupture) on their own, but most will continue to get worse without care. The infection can spread deeper into the body and eventually into the bloodstream, causing you to feel ill.  DIAGNOSIS  Your caregiver will take your medical history and perform a physical exam. A sample of fluid may also be taken from the abscess to determine what is causing your infection. TREATMENT  Your caregiver may prescribe antibiotic medicines to fight the infection. However, taking antibiotics alone usually does not cure an abscess. Your caregiver may need to make a small cut (incision) in the abscess to drain the pus. In some cases, gauze is packed into the abscess to reduce pain and to continue draining the area. HOME CARE INSTRUCTIONS   Only take over-the-counter or prescription medicines for pain, discomfort, or fever as directed by your caregiver.  If you were prescribed antibiotics, take them as directed. Finish them even if you start to feel better.  If gauze is used, follow your caregiver's directions for changing the gauze.  To avoid spreading the infection:  Keep your draining abscess covered with a bandage.  Wash your hands well.  Do not share personal care items, towels, or whirlpools with others.  Avoid skin contact with others.  Keep your skin and clothes clean around the abscess.  Keep all follow-up appointments  as directed by your caregiver. SEEK MEDICAL CARE IF:   You have increased pain, swelling, redness, fluid drainage, or bleeding.  You have muscle aches, chills, or a general ill feeling.  You have a fever. MAKE SURE YOU:   Understand these instructions.  Will watch your condition.  Will get help right away if you are not doing well or get worse. Document Released: 02/01/2005 Document Revised: 10/24/2011 Document Reviewed: 07/07/2011 [facility] Patient Information 2014 [place].    DASH  Diet The DASH diet stands for "Dietary Approaches to Stop Hypertension." It is a healthy eating plan that has been shown to reduce high blood pressure (hypertension) in as little as 14 days, while also possibly providing other significant health benefits. These other health benefits include reducing the risk of breast cancer after menopause and reducing the risk of type 2 diabetes, heart disease, colon cancer, and stroke. Health benefits also include weight loss and slowing kidney failure in patients with chronic kidney disease.  DIET GUIDELINES  Limit salt (sodium). Your diet should contain less than 1500 mg of sodium daily.  Limit refined or processed carbohydrates. Your diet should include mostly whole grains. Desserts and added sugars should be used sparingly.  Include small amounts of heart-healthy fats. These types of fats include nuts, oils, and tub margarine. Limit saturated and trans fats. These fats have been shown to be harmful in the body. CHOOSING FOODS  The following food groups are based on a 2000 calorie diet. See your Registered Dietitian for individual calorie needs. Grains and Grain Products (6 to 8 servings daily)  Eat More Often: Whole-wheat bread, brown rice, whole-grain or wheat pasta, quinoa, popcorn without added fat or salt (air popped).  Eat Less Often: White bread, white pasta, white rice, cornbread. Vegetables (4 to 5 servings daily)  Eat More Often: Fresh, frozen, and canned vegetables. Vegetables may be raw, steamed, roasted, or grilled with a minimal amount of fat.  Eat Less Often/Avoid: Creamed or fried vegetables. Vegetables in a cheese sauce. Fruit (4 to 5 servings daily)  Eat More Often: All fresh, canned (in natural juice), or frozen fruits. Dried fruits without added sugar. One hundred percent fruit juice ( cup [237 mL] daily).  Eat Less Often: Dried fruits with added sugar. Canned fruit in light or heavy syrup. [employer], Fish, and Poultry (2  servings or less daily. One serving is 3 to 4 oz [85-114 g]).  Eat More Often: Ninety percent or leaner ground beef, tenderloin, sirloin. Round cuts of beef, chicken breast, [place] breast. All fish. Grill, bake, or broil your meat. Nothing should be fried.  Eat Less Often/Avoid: Fatty cuts of meat, [place], or chicken leg, thigh, or wing. Fried cuts of meat or fish. Dairy (2 to 3 servings)  Eat More Often: Low-fat or fat-free milk, low-fat plain or light yogurt, reduced-fat or part-skim cheese.  Eat Less Often/Avoid: Milk (whole, 2%).Whole milk yogurt. Full-fat cheeses. Nuts, Seeds, and Legumes (4 to 5 servings per week)  Eat More Often: All without added salt.  Eat Less Often/Avoid: Salted nuts and seeds, canned beans with added salt. Fats and Sweets (limited)  Eat More Often: Vegetable oils, tub margarines without trans fats, sugar-free gelatin. Mayonnaise and salad dressings.  Eat Less Often/Avoid: Coconut oils, palm oils, butter, stick margarine, cream, half and half, cookies, candy, pie. FOR MORE INFORMATION The Dash Diet Eating Plan: www.dashdiet.org Document Released: 04/13/2011 Document Revised: 07/17/2011 Document Reviewed: 04/13/2011 [facility] Patient Information 2014 [place], [place].  Hypertension As your  heart beats, it forces blood through your arteries. This force is your blood pressure. If the pressure is too high, it is called hypertension (HTN) or high blood pressure. HTN is dangerous because you may have it and not know it. High blood pressure may mean that your heart has to work harder to pump blood. Your arteries may be narrow or stiff. The extra work puts you at risk for heart disease, stroke, and other problems.  Blood pressure consists of two numbers, a higher number over a lower, 110/72, for example. It is stated as "110 over 72." The ideal is below 120 for the top number (systolic) and under 80 for the bottom (diastolic). Write down your blood pressure today. You  should pay close attention to your blood pressure if you have certain conditions such as:  Heart failure.  Prior heart attack.  Diabetes  Chronic kidney disease.  Prior stroke.  Multiple risk factors for heart disease. To see if you have HTN, your blood pressure should be measured while you are seated with your arm held at the level of the heart. It should be measured at least twice. A one-time elevated blood pressure reading (especially in the Emergency Department) does not mean that you need treatment. There may be conditions in which the blood pressure is different between your right and left arms. It is important to see your caregiver soon for a recheck. Most people have essential hypertension which means that there is not a specific cause. This type of high blood pressure may be lowered by changing lifestyle factors such as:  Stress.  Smoking.  Lack of exercise.  Excessive weight.  Drug/tobacco/alcohol use.  Eating less salt. Most people do not have symptoms from high blood pressure until it has caused damage to the body. Effective treatment can often prevent, delay or reduce that damage. TREATMENT  When a cause has been identified, treatment for high blood pressure is directed at the cause. There are a large number of medications to treat HTN. These fall into several categories, and your caregiver will help you select the medicines that are best for you. Medications may have side effects. You should review side effects with your caregiver. If your blood pressure stays high after you have made lifestyle changes or started on medicines,   Your medication(s) may need to be changed.  Other problems may need to be addressed.  Be certain you understand your prescriptions, and know how and when to take your medicine.  Be sure to follow up with your caregiver within the time frame advised (usually within two weeks) to have your blood pressure rechecked and to review your  medications.  If you are taking more than one medicine to lower your blood pressure, make sure you know how and at what times they should be taken. Taking two medicines at the same time can result in blood pressure that is too low. SEEK IMMEDIATE MEDICAL CARE IF:  You develop a severe headache, blurred or changing vision, or confusion.  You have unusual weakness or numbness, or a faint feeling.  You have severe chest or abdominal pain, vomiting, or breathing problems. MAKE SURE YOU:   Understand these instructions.  Will watch your condition.  Will get help right away if you are not doing well or get worse. Document Released: 04/24/2005 Document Revised: 07/17/2011 Document Reviewed: 12/13/2007 [facility] Patient Information 2014 [place]. Sulfamethoxazole; Trimethoprim, SMX-TMP tablets What is this medicine? SULFAMETHOXAZOLE; TRIMETHOPRIM or SMX-TMP (suhl fuh meth OK suh zohl;  trye METH oh prim) is a combination of a sulfonamide antibiotic and a second antibiotic, trimethoprim. It is used to treat or prevent certain kinds of bacterial infections. It will not work for colds, flu, or other viral infections. This medicine may be used for other purposes; ask your health care provider or pharmacist if you have questions. COMMON BRAND NAME(S): Bacter-Aid DS , Bactrim DS, Bactrim, Septra DS, Septra What should I tell my health care provider before I take this medicine? They need to know if you have any of these conditions: -anemia -asthma -being treated with anticonvulsants -if you frequently drink alcohol containing drinks -kidney disease -liver disease -low level of folic acid or [id number] dehydrogenase -poor nutrition or malabsorption -porphyria -severe allergies -thyroid disorder -an unusual or allergic reaction to sulfamethoxazole, trimethoprim, sulfa drugs, other medicines, foods, dyes, or preservatives -pregnant or trying to get pregnant -breast-feeding How  should I use this medicine? Take this medicine by mouth with a full glass of water. Follow the directions on the prescription label. Take your medicine at regular intervals. Do not take it more often than directed. Do not skip doses or stop your medicine early. Talk to your pediatrician regarding the use of this medicine in children. Special care may be needed. This medicine has been used in children as young as 80 months of age. Overdosage: If you think you have taken too much of this medicine contact a poison control center or emergency room at once. NOTE: This medicine is only for you. Do not share this medicine with others. What if I miss a dose? If you miss a dose, take it as soon as you can. If it is almost time for your next dose, take only that dose. Do not take double or extra doses. What may interact with this medicine? Do not take this medicine with any of the following medications: -aminobenzoate potassium -dofetilide -metronidazole This medicine may also interact with the following medications: -ACE inhibitors like benazepril, enalapril, lisinopril, and ramipril -cyclosporine -digoxin -diuretics -indomethacin -medicines for diabetes -methenamine -methotrexate -phenytoin -potassium supplements -pyrimethamine -sulfinpyrazone -tricyclic antidepressants -warfarin This list may not describe all possible interactions. Give your health care provider a list of all the medicines, herbs, non-prescription drugs, or dietary supplements you use. Also tell them if you smoke, drink alcohol, or use illegal drugs. Some items may interact with your medicine. What should I watch for while using this medicine? Tell your doctor or health care professional if your symptoms do not improve. Drink several glasses of water a day to reduce the risk of kidney problems. Do not treat diarrhea with over the counter products. Contact your doctor if you have diarrhea that lasts more than 2 days or if it is  severe and watery. This medicine can make you more sensitive to the sun. Keep out of the sun. If you cannot avoid being in the sun, wear protective clothing and use a sunscreen. Do not use sun lamps or tanning beds/booths. What side effects may I notice from receiving this medicine? Side effects that you should report to your doctor or health care professional as soon as possible: -allergic reactions like skin rash or hives, swelling of the face, lips, or tongue -breathing problems -fever or chills, sore throat -irregular heartbeat, chest pain -joint or muscle pain -pain or difficulty passing urine -red pinpoint spots on skin -redness, blistering, peeling or loosening of the skin, including inside the mouth -unusual bleeding or bruising -unusually weak or tired -yellowing of the eyes or  skin Side effects that usually do not require medical attention (report to your doctor or health care professional if they continue or are bothersome): -diarrhea -dizziness -headache -loss of appetite -nausea, vomiting -nervousness This list may not describe all possible side effects. Call your doctor for medical advice about side effects. You may report side effects to FDA at 1-800-FDA-1088. Where should I keep my medicine? Keep out of the reach of children. Store at room temperature between 20 to 25 degrees C (68 to 77 degrees F). Protect from light. Throw away any unused medicine after the expiration date. NOTE: This sheet is a summary. It may not cover all possible information. If you have questions about this medicine, talk to your doctor, pharmacist, or health care provider.  2014, [employer]. (2007-12-25 11:32:51)

## 2013-08-29 NOTE — Assessment & Plan Note (Signed)
Eczematous changes to skin disced changing to milder soaps/detergents, moisturizing, Using TMC 0.1% bid (not to face, groin, axillae)

## 2013-08-29 NOTE — Assessment & Plan Note (Addendum)
At least stage 2/3 wounds to b/l hips malodorous.  These may have been areas of previous abscess which patient has picked out "plug"  Rx Bactrim ds bid x 7 days-check BMET at f/u  Referred to wound care center  RTC 2 weeks

## 2013-09-01 NOTE — Progress Notes (Signed)
INTERNAL MEDICINE TEACHING ATTENDING ADDENDUM - Aldine Contes, MD: I reviewed and discussed at the time of visit with the resident Dr. Aundra Dubin, the patient's medical history, physical examination, diagnosis and results of tests and treatment and I agree with the patient's care as documented.

## 2013-09-05 ENCOUNTER — Ambulatory Visit: Payer: PRIVATE HEALTH INSURANCE | Admitting: Internal Medicine

## 2013-09-08 ENCOUNTER — Other Ambulatory Visit: Payer: Self-pay | Admitting: Internal Medicine

## 2013-09-09 ENCOUNTER — Ambulatory Visit (INDEPENDENT_AMBULATORY_CARE_PROVIDER_SITE_OTHER): Payer: PRIVATE HEALTH INSURANCE | Admitting: Internal Medicine

## 2013-09-09 ENCOUNTER — Encounter: Payer: Self-pay | Admitting: Internal Medicine

## 2013-09-09 VITALS — BP 102/66 | HR 74 | Temp 98.9°F | Ht 71.0 in | Wt 250.0 lb

## 2013-09-09 DIAGNOSIS — R609 Edema, unspecified: Secondary | ICD-10-CM

## 2013-09-09 DIAGNOSIS — M48061 Spinal stenosis, lumbar region without neurogenic claudication: Secondary | ICD-10-CM

## 2013-09-09 DIAGNOSIS — R238 Other skin changes: Secondary | ICD-10-CM

## 2013-09-09 DIAGNOSIS — T148XXA Other injury of unspecified body region, initial encounter: Secondary | ICD-10-CM

## 2013-09-09 DIAGNOSIS — I10 Essential (primary) hypertension: Secondary | ICD-10-CM

## 2013-09-09 MED ORDER — HYDROCODONE-ACETAMINOPHEN 10-325 MG PO TABS: 1.0000 | ORAL_TABLET | Freq: Four times a day (QID) | ORAL | Status: DC | PRN

## 2013-09-09 NOTE — Progress Notes (Signed)
HPI The patient is a 79 y.o. male with a history of blindness, HTN, chronic hip pain, presenting for a follow-up visit for leg wounds.  The patient was seen 4/24 for skin abscesses at several sites.  The patient has taken a course of bactrim, with significant improvement in these lesions, with complete resolution of most of the lesions, though with a persistent shallow ulcer at the left hip, which he is keeping bandaged.  He was referred to the [facility], but has not yet gone.  The patient notes a 1-month history of worsening bilateral LE edema.  He has used compression stockings in the past, which improved his symptoms, but the stockings broke about 3-4 months ago, and his swelling returned over the last few months.    The patient has chronic DDD with lumbar radiculopathy.  The patient takes Hydrocortisone, 4/day, which helps with the pain.  However, he feels that the pain is gradually worsening, and he is running out of his medication early.  ROS: General: no fevers, chills, changes in weight, changes in appetite Skin: see HPI HEENT: no blurry vision, hearing changes, sore throat Pulm: no dyspnea, coughing, wheezing CV: no chest pain, palpitations, shortness of breath Abd: no abdominal pain, nausea/vomiting, diarrhea/constipation GU: no dysuria, hematuria, polyuria Ext: no arthralgias, myalgias Neuro: no weakness, numbness, or tingling  Filed Vitals:   09/09/13 1110  BP: 102/66  Pulse: 74  Temp: 98.9 F (37.2 C)    PEX General: alert, cooperative, and in no apparent distress HEENT: pupils equal round and reactive to light, vision grossly intact, oropharynx clear and non-erythematous  Neck: supple Lungs: clear to ascultation bilaterally, normal work of respiration, no wheezes, rales, ronchi Heart: regular rate and rhythm, no murmurs, gallops, or rubs Abdomen: soft, non-tender, non-distended, normal bowel sounds Extremities: bilateral legs with at least 4 well-healed  lesions (previously abscesses). Large shallow clean-based ulcer on left lateral hip with no surrounding erythema or edema.  1+ pitting edema in bilateral legs to the level of the knees Neurologic: alert & oriented X3, cranial nerves II-XII intact, strength grossly intact, sensation intact to light touch  Current Outpatient Prescriptions on File Prior to Visit  Medication Sig Dispense Refill  . albuterol (PROVENTIL HFA;VENTOLIN HFA) 108 (90 BASE) MCG/ACT inhaler Inhale 2 puffs into the lungs every 6 (six) hours as needed for wheezing.  3 Inhaler  11  . amLODipine (NORVASC) 10 MG tablet TAKE 1 TABLET (10 MG TOTAL) BY MOUTH DAILY.  30 tablet  11  . aspirin 81 MG EC tablet Take 1 tablet (81 mg total) by mouth daily.  30 tablet  11  . bacitracin 500 UNIT/GM ointment Apply 1 application topically 2 (two) times daily.  453 g  0  . carvedilol (COREG) 25 MG tablet Take 1 tablet (25 mg total) by mouth 2 (two) times daily with a meal.  60 tablet  11  . enalapril (VASOTEC) 20 MG tablet Take 2 tablets (40 mg total) by mouth daily.  60 tablet  11  . Fluticasone-Salmeterol (ADVAIR DISKUS) 100-50 MCG/DOSE AEPB Inhale 1 puff into the lungs 2 (two) times daily.  30 each  11  . furosemide (LASIX) 20 MG tablet Take 1 tablet (20 mg total) by mouth daily.  30 tablet  11  . gabapentin (NEURONTIN) 600 MG tablet Take 1 tablet (600 mg total) by mouth 2 (two) times daily.  180 tablet  3  . HYDROcodone-acetaminophen (NORCO) 10-325 MG per tablet Take 1 tablet by mouth every 6 (  six) hours as needed.  120 tablet  0  . meloxicam (MOBIC) 7.5 MG tablet Take 1 tablet (7.5 mg total) by mouth daily as needed for pain.  30 tablet  2  . methylcellulose packet Take 1 each by mouth as needed for constipation.  50 each  5  . pantoprazole (PROTONIX) 20 MG tablet Take 1 tablet (20 mg total) by mouth daily.  30 tablet  11  . rosuvastatin (CRESTOR) 10 MG tablet Take 1 tablet (10 mg total) by mouth daily.  90 tablet  3  . STOOL SOFTENER 250 MG  capsule TAKE 1 CAPSULE (250 MG TOTAL) BY MOUTH DAILY.  30 capsule  6  . sulfamethoxazole-trimethoprim (BACTRIM DS) 800-160 MG per tablet Take 1 tablet by mouth 2 (two) times daily.  14 tablet  0  . Tadalafil (CIALIS) 2.5 MG TABS Take 1 tablet (2.5 mg total) by mouth daily as needed for erectile dysfunction.  30 tablet  1  . triamcinolone cream (KENALOG) 0.1 % Apply topically 2 (two) times daily. To itchy areas of arms and legs.  Use for only 2-3 weeks at a time.  85.2 g  4  . VOLTAREN 1 % GEL APPLY 3 TIMES DAILY AS NEEDED FOR JOINT PAIN  200 g  6   No current facility-administered medications on file prior to visit.    Assessment/Plan

## 2013-09-09 NOTE — Assessment & Plan Note (Signed)
The patient has a history of chronic DDD, on hydrocodone-acetaminophen.  The patient notes running out of his medication early due to gradually increasing pain. -we will increase the patient's hydrocodone-acetaminophen from 120 to 150 tabs/month

## 2013-09-09 NOTE — Assessment & Plan Note (Signed)
BP Readings from Last 3 Encounters:  09/09/13 102/66  08/29/13 135/68  06/06/13 131/70    Lab Results  Component Value Date   NA 140 11/11/2012   K 3.5 11/11/2012   CREATININE 1.12 11/11/2012    Assessment: Blood pressure control: controlled Progress toward BP goal:  at goal Comments: BP well-controlled  Plan: Medications:  continue current medications Educational resources provided:   Self management tools provided:   Other plans: Recheck at next visit

## 2013-09-09 NOTE — Patient Instructions (Signed)
General Instructions: For the swelling in your legs, we are prescribing compression stockings.  For the wound on your left thigh, continue to keep the area covered with a bandage, and check on the area on a regular basis.  If the area gets any worse, we will need to send you to the wound care center.  For your chronic hip pain, we are increasing your dose of Hydrocodone-Acetaminophen to 150 tablets/month.  Please return for a follow-up visit in 3 months.   Treatment Goals:  Goals (1 Years of Data) as of 09/09/13         As of Today 08/29/13 06/06/13 05/13/13 05/13/13     Blood Pressure    . Blood Pressure < 140/90  102/66 135/68 131/70 185/81 191/81      Progress Toward Treatment Goals:  Treatment Goal 09/09/2013  Blood pressure at goal    Self Care Goals & Plans:  Self Care Goal 08/29/2013  Manage my medications take my medicines as prescribed; bring my medications to every visit; refill my medications on time; follow the sick day instructions if I am sick  Monitor my health keep track of my weight  Eat healthy foods eat foods that are low in salt; eat baked foods instead of fried foods; eat fruit for snacks and desserts; eat more vegetables; eat smaller portions  Be physically active find an activity I enjoy  Meeting treatment goals maintain the current self-care plan    No flowsheet data found.   Care Management & Community Referrals:  Referral 09/09/2013  Referrals made for care management support none needed  Referrals made to community resources -

## 2013-09-09 NOTE — Assessment & Plan Note (Signed)
The abscesses on the patient's skin appear to have largely resolved.  The patient still has a large, shallow, broad ulcer on his left lateral hip, with no signs of active infection. -keep left hip wound covered with a bandage, and change dressing daily -the patient prefers to avoid going to the [facility] if possible.  We can continue to monitor the area, and refer if the area worsens

## 2013-09-09 NOTE — Assessment & Plan Note (Signed)
The patient notes worsening LE edema after his compression stockings tore.  He notes no orthopnea, PND. -re-measured patient for a new pair of compression stockings

## 2013-09-10 NOTE — Addendum Note (Signed)
Addended by: Hulan Fray on: 09/10/2013 12:31 PM   Modules accepted: Orders

## 2013-09-11 NOTE — Progress Notes (Signed)
Case discussed with Dr. Brown soon after the resident saw the patient.  We reviewed the resident's history and exam and pertinent patient test results.  I agree with the assessment, diagnosis, and plan of care documented in the resident's note. 

## 2013-09-12 ENCOUNTER — Other Ambulatory Visit: Payer: Self-pay | Admitting: *Deleted

## 2013-09-12 MED ORDER — TRIAMCINOLONE ACETONIDE 0.1 % EX CREA: TOPICAL_CREAM | Freq: Two times a day (BID) | CUTANEOUS | Status: DC

## 2013-09-25 ENCOUNTER — Other Ambulatory Visit: Payer: Self-pay | Admitting: Internal Medicine

## 2013-10-11 ENCOUNTER — Other Ambulatory Visit: Payer: Self-pay | Admitting: Internal Medicine

## 2013-10-14 ENCOUNTER — Other Ambulatory Visit: Payer: Self-pay | Admitting: Internal Medicine

## 2013-10-20 ENCOUNTER — Other Ambulatory Visit: Payer: Self-pay | Admitting: Internal Medicine

## 2013-10-22 ENCOUNTER — Encounter: Payer: Self-pay | Admitting: Internal Medicine

## 2013-10-22 ENCOUNTER — Ambulatory Visit (INDEPENDENT_AMBULATORY_CARE_PROVIDER_SITE_OTHER): Payer: PRIVATE HEALTH INSURANCE | Admitting: Internal Medicine

## 2013-10-22 VITALS — BP 127/74 | HR 66 | Temp 98.6°F | Ht 71.0 in | Wt 255.4 lb

## 2013-10-22 DIAGNOSIS — L02419 Cutaneous abscess of limb, unspecified: Secondary | ICD-10-CM

## 2013-10-22 DIAGNOSIS — L03119 Cellulitis of unspecified part of limb: Secondary | ICD-10-CM

## 2013-10-22 DIAGNOSIS — I10 Essential (primary) hypertension: Secondary | ICD-10-CM

## 2013-10-22 DIAGNOSIS — L03115 Cellulitis of right lower limb: Secondary | ICD-10-CM

## 2013-10-22 MED ORDER — FUROSEMIDE 20 MG PO TABS: 20.0000 mg | ORAL_TABLET | Freq: Every day | ORAL | Status: DC

## 2013-10-22 MED ORDER — CEPHALEXIN 500 MG PO CAPS: 500.0000 mg | ORAL_CAPSULE | Freq: Four times a day (QID) | ORAL | Status: AC

## 2013-10-22 MED ORDER — HYDROXYZINE HCL 10 MG PO TABS: 10.0000 mg | ORAL_TABLET | Freq: Three times a day (TID) | ORAL | Status: DC | PRN

## 2013-10-22 NOTE — Patient Instructions (Signed)
General Instructions: Please take your antibiotics cephalexin 500mg  EVERY 6 HOURS daily for 7 days starting today  Please return to clinic 6/23 or 6/24 for revaluation  If your swelling gets worse in the meantime, or you have fever, chills, pain call us and let us know [phone] or go to ER if severe  Try to wear your compression stockings  Take your lasix pill daily for at least one week, and then maybe we can do every other day.   Thank you for bringing your medicines today. This helps [place] keep you safe from mistakes.   Progress Toward Treatment Goals:  Treatment Goal 10/22/2013  Blood pressure at goal    Self Care Goals & Plans:  Self Care Goal 10/22/2013  Manage my medications bring my medications to every visit  Monitor my health -  Eat healthy foods -  Be physically active -  Meeting treatment goals -    No flowsheet data found.   Care Management & Community Referrals:  Referral 09/09/2013  Referrals made for care management support none needed  Referrals made to community resources -    Cephalexin tablets or capsules What is this medicine? CEPHALEXIN (sef a LEX in) is a cephalosporin antibiotic. It is used to treat certain kinds of bacterial infections It will not work for colds, flu, or other viral infections. This medicine may be used for other purposes; ask your health care provider or pharmacist if you have questions. COMMON BRAND NAME(S): Biocef, Keflex, Keftab What should I tell my health care provider before I take this medicine? They need to know if you have any of these conditions: -kidney disease -stomach or intestine problems, especially colitis -an unusual or allergic reaction to cephalexin, other cephalosporins, penicillins, other antibiotics, medicines, foods, dyes or preservatives -pregnant or trying to get pregnant -breast-feeding How should I use this medicine? Take this medicine by mouth with a full glass of water. Follow the directions on the  prescription label. This medicine can be taken with or without food. Take your medicine at regular intervals. Do not take your medicine more often than directed. Take all of your medicine as directed even if you think you are better. Do not skip doses or stop your medicine early. Talk to your pediatrician regarding the use of this medicine in children. While this drug may be prescribed for selected conditions, precautions do apply. Overdosage: If you think you have taken too much of this medicine contact a poison control center or emergency room at once. NOTE: This medicine is only for you. Do not share this medicine with others. What if I miss a dose? If you miss a dose, take it as soon as you can. If it is almost time for your next dose, take only that dose. Do not take double or extra doses. There should be at least 4 to 6 hours between doses. What may interact with this medicine? -probenecid -some other antibiotics This list may not describe all possible interactions. Give your health care provider a list of all the medicines, herbs, non-prescription drugs, or dietary supplements you use. Also tell them if you smoke, drink alcohol, or use illegal drugs. Some items may interact with your medicine. What should I watch for while using this medicine? Tell your doctor or health care professional if your symptoms do not begin to improve in a few days. Do not treat diarrhea with over the counter products. Contact your doctor if you have diarrhea that lasts more than 2 days or  if it is severe and watery. If you have diabetes, you may get a false-positive result for sugar in your urine. Check with your doctor or health care professional. What side effects may I notice from receiving this medicine? Side effects that you should report to your doctor or health care professional as soon as possible: -allergic reactions like skin rash, itching or hives, swelling of the face, lips, or tongue -breathing  problems -pain or trouble passing urine -redness, blistering, peeling or loosening of the skin, including inside the mouth -severe or watery diarrhea -unusually weak or tired -yellowing of the eyes, skin Side effects that usually do not require medical attention (report to your doctor or health care professional if they continue or are bothersome): -gas or heartburn -genital or anal irritation -headache -joint or muscle pain -nausea, vomiting This list may not describe all possible side effects. Call your doctor for medical advice about side effects. You may report side effects to FDA at 1-800-FDA-1088. Where should I keep my medicine? Keep out of the reach of children. Store at room temperature between 59 and 86 degrees F (15 and 30 degrees C). Throw away any unused medicine after the expiration date. NOTE: This sheet is a summary. It may not cover all possible information. If you have questions about this medicine, talk to your doctor, pharmacist, or health care provider.  2014, [employer]vier/Gold Standard. (2007-07-29 17:09:13)

## 2013-10-22 NOTE — Assessment & Plan Note (Addendum)
Noted to have venous stasis ulcer on RLE from before and treated previously with bactrim for cellulitis as well.   Recurrent episode of pain, itching, and swelling. Tender, erythematous, and warm to palpation on exam x at least 1 week. No fever or chills. ?etiology--possible exacerbation of chronic venous insufficiency. Denies obvious trauma to area.   -keflex given age and renal function previously slightly elevated x 7 days for now with RTC for follow up in 1 week or sooner if no improvement or if fever, chills and worsening condition -bmet -compression stockings as tolerated, otherwise elevate leg at rest -resume lasix daily  -may re-consider wound care consult if no improvement (canceled on last visit) -atarax prn itching
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
## 2013-10-22 NOTE — Assessment & Plan Note (Signed)
BP Readings from Last 3 Encounters:  10/22/13 127/74  09/09/13 102/66  08/29/13 135/68   Lab Results  Component Value Date   NA 140 11/11/2012   K 3.5 11/11/2012   CREATININE 1.12 11/11/2012   Assessment: Blood pressure control: controlled Progress toward BP goal:  at goal  Plan: Medications: continue current medications: norvasc (could be contributing to lower extremity edema so may consider changing in future), coreg 25mg  bid, enalapril 40mg , and lasix 20mg  (did not take today)

## 2013-10-23 LAB — BASIC METABOLIC PANEL WITH GFR
BUN: 13 mg/dL (ref 6–23)
CHLORIDE: 103 meq/L (ref 96–112)
CO2: 32 mEq/L (ref 19–32)
CREATININE: 1.33 mg/dL (ref 0.50–1.35)
Calcium: 8.4 mg/dL (ref 8.4–10.5)
GFR, EST NON AFRICAN AMERICAN: 51 mL/min — AB
GFR, Est African American: 59 mL/min — ABNORMAL LOW
Glucose, Bld: 91 mg/dL (ref 70–99)
POTASSIUM: 4.6 meq/L (ref 3.5–5.3)
SODIUM: 141 meq/L (ref 135–145)

## 2013-10-23 NOTE — Progress Notes (Signed)
INTERNAL MEDICINE TEACHING ATTENDING ADDENDUM - Aldine Contes, MD: I personally saw and evaluated Mr. Valtierra in this clinic visit in conjunction with the resident, Dr. Eula Fried. I have discussed patient's plan of care with medical resident during this visit. I have confirmed the physical exam findings and have read and agree with the clinic note including the plan with the following addition: - patient with stasis ulcer on LE with surrounding erythema and increased local warmth - likely cellulitis - Agree with keflex- 1 week course - patient to follow up in 1 week for re evaluation - c/w lasix daily for now given LE edema - agree with compression stockings

## 2013-10-29 ENCOUNTER — Other Ambulatory Visit: Payer: Self-pay | Admitting: Internal Medicine

## 2013-10-29 ENCOUNTER — Other Ambulatory Visit: Payer: Self-pay | Admitting: *Deleted

## 2013-10-30 ENCOUNTER — Other Ambulatory Visit: Payer: Self-pay | Admitting: Internal Medicine

## 2013-10-30 NOTE — Telephone Encounter (Signed)
Was done already 10/30/13 PM.

## 2013-10-30 NOTE — Telephone Encounter (Signed)
Pharmacy had requested medication after this note was written. Request was removed - med was approved. Hilda Blades Ditzler RN 10/30/13 2:30PM

## 2013-11-18 ENCOUNTER — Telehealth: Payer: Self-pay | Admitting: Internal Medicine

## 2013-11-18 NOTE — Telephone Encounter (Signed)
   Reason for call:   I received a call from [name] at 530  PM indicating that he needed to know when his appt was scheduled to be able to schedule his ride.   Pertinent Data:   Chart review   Assessment / Plan / Recommendations:   Pt has appt at 945 am on 11/20/13  As always, pt is advised that if symptoms worsen or new symptoms arise, they should go to an urgent care facility or to to ER for further evaluation.   Clinton Gallant, MD   11/18/2013, 5:35 PM

## 2013-11-20 ENCOUNTER — Ambulatory Visit: Payer: PRIVATE HEALTH INSURANCE | Admitting: Internal Medicine

## 2013-11-27 ENCOUNTER — Other Ambulatory Visit: Payer: Self-pay | Admitting: Internal Medicine

## 2013-12-17 ENCOUNTER — Ambulatory Visit: Payer: PRIVATE HEALTH INSURANCE | Admitting: Internal Medicine

## 2013-12-18 ENCOUNTER — Other Ambulatory Visit: Payer: Self-pay | Admitting: *Deleted

## 2013-12-19 ENCOUNTER — Encounter: Payer: Self-pay | Admitting: Internal Medicine

## 2013-12-19 ENCOUNTER — Ambulatory Visit (INDEPENDENT_AMBULATORY_CARE_PROVIDER_SITE_OTHER): Payer: PRIVATE HEALTH INSURANCE | Admitting: Internal Medicine

## 2013-12-19 VITALS — BP 127/66 | HR 63 | Temp 98.6°F | Wt 247.6 lb

## 2013-12-19 DIAGNOSIS — M150 Primary generalized (osteo)arthritis: Secondary | ICD-10-CM

## 2013-12-19 DIAGNOSIS — R7989 Other specified abnormal findings of blood chemistry: Secondary | ICD-10-CM

## 2013-12-19 DIAGNOSIS — I10 Essential (primary) hypertension: Secondary | ICD-10-CM

## 2013-12-19 DIAGNOSIS — M159 Polyosteoarthritis, unspecified: Secondary | ICD-10-CM

## 2013-12-19 DIAGNOSIS — L02419 Cutaneous abscess of limb, unspecified: Secondary | ICD-10-CM

## 2013-12-19 DIAGNOSIS — L03115 Cellulitis of right lower limb: Secondary | ICD-10-CM

## 2013-12-19 DIAGNOSIS — M1990 Unspecified osteoarthritis, unspecified site: Secondary | ICD-10-CM

## 2013-12-19 DIAGNOSIS — L03119 Cellulitis of unspecified part of limb: Secondary | ICD-10-CM

## 2013-12-19 LAB — BASIC METABOLIC PANEL WITH GFR
BUN: 11 mg/dL (ref 6–23)
CO2: 32 meq/L (ref 19–32)
Calcium: 8.5 mg/dL (ref 8.4–10.5)
Chloride: 99 mEq/L (ref 96–112)
Creat: 1.22 mg/dL (ref 0.50–1.35)
GFR, EST AFRICAN AMERICAN: 66 mL/min
GFR, Est Non African American: 57 mL/min — ABNORMAL LOW
Glucose, Bld: 88 mg/dL (ref 70–99)
Potassium: 4.9 mEq/L (ref 3.5–5.3)
Sodium: 141 mEq/L (ref 135–145)

## 2013-12-19 MED ORDER — FUROSEMIDE 20 MG PO TABS: 40.0000 mg | ORAL_TABLET | Freq: Every day | ORAL | Status: DC

## 2013-12-19 MED ORDER — HYDROCODONE-ACETAMINOPHEN 10-325 MG PO TABS: 1.0000 | ORAL_TABLET | Freq: Four times a day (QID) | ORAL | Status: DC | PRN

## 2013-12-19 MED ORDER — AMLODIPINE BESYLATE 5 MG PO TABS: ORAL_TABLET | ORAL | Status: DC

## 2013-12-19 MED ORDER — TRIAMCINOLONE ACETONIDE 0.1 % EX CREA: TOPICAL_CREAM | Freq: Two times a day (BID) | CUTANEOUS | Status: DC

## 2013-12-19 NOTE — Assessment & Plan Note (Signed)
Pt had a pain contract with Dr. Owens Shark for #150 vicodin 10-325 q6hours prn for pain. He gets his paper refills 3 months a time. No red flags were noted in his chart.   - refill given for the month of Aug-October. Next refill due will be starting November to be filled after 03/29/14.

## 2013-12-19 NOTE — Progress Notes (Signed)
Subjective:     Patient ID: Nicholas [name], male   DOB: 07/30/1934, 79 y.o.   MRN: [id number]  HPI Pt is a 79 y/o male w/ PMHx of blindness, HTN, GERD, OA, and CVD who presents to clinic for rt leg cellulitis x 1 week. He was recently seen in clinic in June for rt leg cellulitis and given keflex and atarax. He says the keflex helped the wound and that he was unable to afford the atarax for itchiness. He reports that this cellulitis feels the same as the previous one in June except that he is does not have any "balls" along legs explaining that he had knots along his entire rt leg.He denies fever, night sweats, or chills. Pt states rt leg usually gets swollen more so than the left which is recurrent issue for patient. He reports itchiness in both legs. He has not had a wound consult nor has any home health aid help him with medications or his rt leg CVD. He denies heart problems but per chart review he had an ECHO in 2013 that revealed grade 1 diastolic heart failure. He currently takes lasix 20mg  once a day. He states he can lay flat at night to sleep, denies difficulty breathing, SOB, or chest pain.   He also has chronic back pain from osteoarthritis and is requesting refills of vicodin. He had a pain contract with Dr. Owens Shark for vicodin 10-325 q6h PRN #150. He usually gets his pain meds 3 months in advance so that he does not have to return to clinic each time.    Review of Systems  Constitutional: Negative for fever and chills.  Respiratory: Negative for shortness of breath.   Cardiovascular: Positive for leg swelling. Negative for chest pain.  Gastrointestinal: Negative for abdominal pain.  Musculoskeletal: Positive for back pain.  Skin:       Rt leg cellulitis.        Objective:   Physical Exam  Constitutional: He appears well-developed and well-nourished.  Eyes:  Wearing sunglasses   Cardiovascular: Normal rate and regular rhythm.   Pulmonary/Chest: Effort normal and breath sounds  normal. He has no rales.  Abdominal: Soft. Bowel sounds are normal.  Skin:  Rt leg above ankle with erythematous 2cm in diameter bump with clear weeping pustules, shiny skin on anterior shin with leg hairs above mid shin length. Chronic darkened skin changes. Horizontal 1 inch in length opened wound on anterior shin by ankle with white base that is dry and non irritated. Rt leg with 2+ pitting edema and lt left w/ 1+ pitting edema.       Assessment:     Please see problem based assessment and plan.      Plan:     Please see problem based assessment and plan.

## 2013-12-19 NOTE — Patient Instructions (Signed)
Please take norvasc 5 mg daily and lasix 40mg  daily.  We will follow up in 1 week to check blood pressure and reassess right leg swelling.

## 2013-12-19 NOTE — Assessment & Plan Note (Signed)
BP Readings from Last 3 Encounters:  12/19/13 127/66  10/22/13 127/74  09/09/13 102/66    Lab Results  Component Value Date   NA 141 10/22/2013   K 4.6 10/22/2013   CREATININE 1.33 10/22/2013    Assessment: Blood pressure control:  controlled Progress toward BP goal:   at goal Comments: pt on norvasc 10mg , lasix 20mg , coreg 25mg , enalapril 40mg   Plan: Medications:  changed lasix to 40mg  daily and decreased norvasc to 5mg  daily, all other meds kept the same Educational resources provided:  none Self management tools provided:  none Other plans: check BMP today. Will schedule for 1 week f/u to recheck BPs since decreasing norvasc to 5mg 

## 2013-12-19 NOTE — Assessment & Plan Note (Addendum)
Pt presents to clinic for rt leg cellulitis x [redacted] week along w/ swollen rt leg. He denies fevers, chills, or night sweats. BP today is 127/66, chart review reveals good control of blood pressures.   - check HbA1c today (came back at 6.1) for recurrent cellulitis and hx of elevated glucose document on 11/09/12 at 146 - check BMP, pt on norvasc 10mg  which we decreased to 5mg  today-- LE edema is a side effect of norvasc. - ABIs for PVD for slowly healing wounds on legs - ECHO to reassess diastolic heart failure, also increase lasix to 40mg  once daily to help with edema - wound care consult made as patient is blind and has only family to help him w/ medications and foot care.  - f/u in one week to recheck blood pressure since we are decreasing norvasc dose.

## 2013-12-19 NOTE — Progress Notes (Signed)
Patient ID: Nicholas [name], male   DOB: 1934-09-12, 79 y.o.   MRN: [id number]  Met with patient. Right lower extremity wounds - mostly skin breaks with clean margins and base and fluid oozing out of the skin of edematous foot. Stasis dermatitis seen, early erythematous pressure changes above ankle. Mild tenderness, no calf tenderness, weak pulses in right foot. Wounds not purulent, not warm. No fever, no chills reported.   Lower extremity venous stasis with pressure related changes on the skin of his legs - possible underlying causes: elderly man with diastolic dysfunction, on amlodipine 10 mg daily.   I discussed with Dr Nicholas [name] - and we have decided that we will decrease amlodipine to 5 mg, increase lasix to 40 mg daily, he is already on max dose of enalapril. I don't think he needs antibiotics at this point - the wounds are new, and not infected. We will schedule him for an Echo and right leg ABIs. BMP today.  We will call the patient back in 1 week to reassess blood pressure. He might need to be taken off amlodipine and started on clonidine (caution advised since he already is on target dose coreg and HR is 60-65). Wound care consult - home health face to face to be done and Dr Nicholas [name] will talk with [name].    I saw and evaluated the patient.  I personally confirmed the key portions of the history and exam documented by Dr. Hulen [name] and I reviewed pertinent patient test results.  The assessment, diagnosis, and plan were formulated together and I agree with the documentation in the resident's note.

## 2013-12-24 ENCOUNTER — Encounter: Payer: Self-pay | Admitting: Internal Medicine

## 2013-12-24 ENCOUNTER — Ambulatory Visit (INDEPENDENT_AMBULATORY_CARE_PROVIDER_SITE_OTHER): Payer: PRIVATE HEALTH INSURANCE | Admitting: Internal Medicine

## 2013-12-24 VITALS — BP 110/69 | HR 85 | Temp 98.5°F | Ht 71.0 in | Wt 236.6 lb

## 2013-12-24 DIAGNOSIS — R238 Other skin changes: Secondary | ICD-10-CM

## 2013-12-24 DIAGNOSIS — L02419 Cutaneous abscess of limb, unspecified: Secondary | ICD-10-CM

## 2013-12-24 DIAGNOSIS — T148XXA Other injury of unspecified body region, initial encounter: Secondary | ICD-10-CM

## 2013-12-24 DIAGNOSIS — L03115 Cellulitis of right lower limb: Secondary | ICD-10-CM

## 2013-12-24 DIAGNOSIS — I10 Essential (primary) hypertension: Secondary | ICD-10-CM

## 2013-12-24 DIAGNOSIS — L03119 Cellulitis of unspecified part of limb: Secondary | ICD-10-CM

## 2013-12-24 NOTE — Patient Instructions (Signed)
Thank you for your visit today.   Please return to the internal medicine clinic in 4-6 weeks.       Your current medical regimen is effective;  continue present plan and take all medications as prescribed.   I have made the following referrals for you: wound care.  Please get your ABIs to check the circulation in your leg. Also please get your echocardiogram or picture of your heart.  Please be sure to bring all of your medications with you to every visit; this includes herbal supplements, vitamins, eye drops, and any over-the-counter medications.   Should you have any questions regarding your medications and/or any new or worsening symptoms, please be sure to call the clinic at [phone].   If you believe that you are suffering from a life threatening condition or one that may result in the loss of limb or function, then you should call 911 or proceed to the nearest Emergency Department.     A healthy lifestyle and preventative care can promote health and wellness.   Maintain regular health, dental, and eye exams.  Eat a healthy diet. Foods like vegetables, fruits, whole grains, low-fat dairy products, and lean protein foods contain the nutrients you need without too many calories. Decrease your intake of foods high in solid fats, added sugars, and salt. Get information about a proper diet from your caregiver, if necessary.  Regular physical exercise is one of the most important things you can do for your health. Most adults should get at least 150 minutes of moderate-intensity exercise (any activity that increases your heart rate and causes you to sweat) each week. In addition, most adults need muscle-strengthening exercises on 2 or more days a week.   Maintain a healthy weight. The body mass index (BMI) is a screening tool to identify possible weight problems. It provides an estimate of body fat based on height and weight. Your caregiver can help determine your BMI, and can help  you achieve or maintain a healthy weight. For adults 20 years and older:  A BMI below 18.5 is considered underweight.  A BMI of 18.5 to 24.9 is normal.  A BMI of 25 to 29.9 is considered overweight.  A BMI of 30 and above is considered obese.
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
## 2013-12-24 NOTE — Assessment & Plan Note (Signed)
Pt comes in for chronic wounds of the RLE that do not appear infected--no erythema, warmth, or drainage.  No fever/chills, N/V/D/C.  Pt was supposed to get ABIs during last visit but has not done this.  -referral to wound care clinic -

## 2013-12-25 NOTE — Progress Notes (Signed)
Case discussed with Dr. Gill soon after the resident saw the patient.  We reviewed the resident's history and exam and pertinent patient test results.  I agree with the assessment, diagnosis, and plan of care documented in the resident's note. 

## 2013-12-25 NOTE — Assessment & Plan Note (Signed)
Pt with chronic healing wounds on the right LE that are without erythema, foul smelling, warmth, or drainage.  Has chronic venous stasis dermatitis.  Denies any fever/chills, N/V/D.   -referral to wound care  -reminded to get ABIs (maybe problem with insurance?)

## 2013-12-25 NOTE — Assessment & Plan Note (Addendum)
Pt here for recheck of BP.  BP today 110/69 after decreasing amlodipine to 5mg . -would consider d/c amlodipine in 79yo with BP at goal -follow-up with PCP -reminded to get echocardiogram  -continue meds

## 2013-12-26 ENCOUNTER — Telehealth: Payer: Self-pay | Admitting: Licensed Clinical Social Worker

## 2013-12-26 NOTE — Telephone Encounter (Signed)
CSW was made aware of [name]'s referral for home health services on 12/25/13.  Referral was originally placed as an inpatient order.  CSW placed call to Mr. Olmeda to discuss services.  Pt currently not at home.  CSW left message requesting return call. CSW provided contact hours and phone number.  CSW will follow up on next business day.

## 2013-12-29 NOTE — Telephone Encounter (Signed)
CSW placed call for home health agency preference.  Pt states he has updated his PCP on his [employer] and is awaiting updated change.  CSW informed [name], will try to proceed with referral noting change of PCP has been requested.  Pt in agreement.  [name] has no preference of agency and in agreement to utilize [place].  Referral sent to [facility].

## 2013-12-30 ENCOUNTER — Telehealth: Payer: Self-pay | Admitting: *Deleted

## 2013-12-30 NOTE — Telephone Encounter (Signed)
Please ask Nicholas [name] about the ABIs being completed--they were ordered but not done due to a problem with his insurance card.  However, the RN should still be able to provide wound care without compression until he is able to get ABIs.  Thanks!

## 2013-12-30 NOTE — Telephone Encounter (Signed)
Call from [place], [place] with [facility] - # [phone]  Nurse wants to know if pt has had Doppler studies, she wants results before she tries UNA boot or compression dressing to legs. Pt seen in clinic on 8/19 , will refer question to Dr Gordy Levan as she saw pt for Cellulitis on leg.

## 2013-12-30 NOTE — Telephone Encounter (Signed)
Contacted pt with appt for ABIs and 2D Echo both test are scheduled for Mon August 31st here at [facility] for Echo and 11am for ABIs.  Spoke with pt and pt's sig other, both verbalized understanding.Despina Hidden Cassady8/25/20154:23 PM

## 2013-12-30 NOTE — Telephone Encounter (Signed)
Call returned to RN, Levander Campion.  No answer message left for her to call clinic. Pt needs to make a name change on insurance card then we can schedule ABI's

## 2013-12-31 NOTE — Telephone Encounter (Signed)
Another call placed to [place] with [facility].  No answer, message left to call clinic about test for pt.and wound care.

## 2013-12-31 NOTE — Telephone Encounter (Signed)
Dianne RN with [facility] returned my call and I informed her of pt's schedule to have ABI's done. She will continue wound care until results are back. She is working of medication management with pt and noted he has not been taking a lot of his meds.  Pt admits to skipping some doses.  Nurse will continue to work on this. Lastly nurse asked for an order for Physical Therapy to evaluation and treat.  Verbal Order given for this.  Is this okay with you?

## 2013-12-31 NOTE — Telephone Encounter (Signed)
Yes, that is fine. Thanks. 

## 2014-01-05 ENCOUNTER — Ambulatory Visit (HOSPITAL_COMMUNITY)
Admission: RE | Admit: 2014-01-05 | Discharge: 2014-01-05 | Disposition: A | Discharge status: Home / Self Care | Payer: PRIVATE HEALTH INSURANCE | Source: Ambulatory Visit | Attending: Internal Medicine | Admitting: Internal Medicine

## 2014-01-05 DIAGNOSIS — I517 Cardiomegaly: Secondary | ICD-10-CM

## 2014-01-05 DIAGNOSIS — L02419 Cutaneous abscess of limb, unspecified: Secondary | ICD-10-CM | POA: Insufficient documentation

## 2014-01-05 DIAGNOSIS — Z87891 Personal history of nicotine dependence: Secondary | ICD-10-CM | POA: Diagnosis not present

## 2014-01-05 DIAGNOSIS — J449 Chronic obstructive pulmonary disease, unspecified: Secondary | ICD-10-CM | POA: Insufficient documentation

## 2014-01-05 DIAGNOSIS — E785 Hyperlipidemia, unspecified: Secondary | ICD-10-CM | POA: Insufficient documentation

## 2014-01-05 DIAGNOSIS — I509 Heart failure, unspecified: Secondary | ICD-10-CM | POA: Insufficient documentation

## 2014-01-05 DIAGNOSIS — L03115 Cellulitis of right lower limb: Secondary | ICD-10-CM

## 2014-01-05 DIAGNOSIS — L03119 Cellulitis of unspecified part of limb: Principal | ICD-10-CM

## 2014-01-05 DIAGNOSIS — I10 Essential (primary) hypertension: Secondary | ICD-10-CM | POA: Insufficient documentation

## 2014-01-05 DIAGNOSIS — J4489 Other specified chronic obstructive pulmonary disease: Secondary | ICD-10-CM | POA: Insufficient documentation

## 2014-01-05 NOTE — Progress Notes (Signed)
  Echocardiogram 2D Echocardiogram has been performed.  Nicholas [name] 01/05/2014, 10:14 AM

## 2014-01-05 NOTE — Progress Notes (Signed)
VASCULAR LAB PRELIMINARY  ARTERIAL  ABI completed: Bilateral ABI demonstrates mild arterial disease, with the left slightly worse than the right.    RIGHT    LEFT    PRESSURE WAVEFORM  PRESSURE WAVEFORM  BRACHIAL 135 Tri BRACHIAL 126 Tri  DP   DP    AT 122 Mono  AT 108 Mono   PT 107 Mono  PT 90 Mono   PER   PER    GREAT TOE  NA GREAT TOE  NA    RIGHT LEFT  ABI 0.9 0.8     Landry Mellow, RDMS, RVT  01/05/2014, 10:41 AM

## 2014-01-15 ENCOUNTER — Other Ambulatory Visit: Payer: Self-pay | Admitting: *Deleted

## 2014-01-16 MED ORDER — ROSUVASTATIN CALCIUM 10 MG PO TABS: 10.0000 mg | ORAL_TABLET | Freq: Every day | ORAL | Status: DC

## 2014-01-21 ENCOUNTER — Encounter: Payer: Self-pay | Admitting: Internal Medicine

## 2014-01-21 ENCOUNTER — Ambulatory Visit (INDEPENDENT_AMBULATORY_CARE_PROVIDER_SITE_OTHER): Payer: PRIVATE HEALTH INSURANCE | Admitting: Internal Medicine

## 2014-01-21 VITALS — BP 118/68 | HR 69 | Temp 97.8°F | Ht 71.0 in | Wt 246.6 lb

## 2014-01-21 DIAGNOSIS — M159 Polyosteoarthritis, unspecified: Secondary | ICD-10-CM

## 2014-01-21 DIAGNOSIS — K219 Gastro-esophageal reflux disease without esophagitis: Secondary | ICD-10-CM

## 2014-01-21 DIAGNOSIS — I5032 Chronic diastolic (congestive) heart failure: Secondary | ICD-10-CM

## 2014-01-21 DIAGNOSIS — Z23 Encounter for immunization: Secondary | ICD-10-CM

## 2014-01-21 DIAGNOSIS — M150 Primary generalized (osteo)arthritis: Secondary | ICD-10-CM

## 2014-01-21 DIAGNOSIS — I509 Heart failure, unspecified: Secondary | ICD-10-CM

## 2014-01-21 DIAGNOSIS — I10 Essential (primary) hypertension: Secondary | ICD-10-CM

## 2014-01-21 DIAGNOSIS — T148XXA Other injury of unspecified body region, initial encounter: Secondary | ICD-10-CM

## 2014-01-21 DIAGNOSIS — R238 Other skin changes: Secondary | ICD-10-CM

## 2014-01-21 MED ORDER — MELOXICAM 7.5 MG PO TABS: ORAL_TABLET | ORAL | Status: DC

## 2014-01-21 MED ORDER — PANTOPRAZOLE SODIUM 20 MG PO TBEC: 20.0000 mg | DELAYED_RELEASE_TABLET | Freq: Every day | ORAL | Status: DC

## 2014-01-21 NOTE — Assessment & Plan Note (Signed)
BP looks good today. Filed Vitals:   01/21/14 1135  BP: 118/68  Pulse: 69  Temp: 97.8 F (36.6 C)   Continue current regimen: amlodipine 5mg , lasix 40mg  daily, coreg 25mg  BID, enapril 40 mg daily.  Didn't d/c amlodopine since his BP is controlled now. Don't want to mess it up. Even though its side effect is lower extremity edema, he is getting better with the lasix. May consider d/cing it later if strongly felt.

## 2014-01-21 NOTE — Assessment & Plan Note (Signed)
Had ABI and ECHO done to evaluate lower extremity swelling and poor wound healing. abi shows mild arterial disease L worse than Right.  Echo shows stable diastolic CHF with preserved EF. hgba1c is 6.1, checked to r/o diabetes as cause of poor wound healing.  Wound has healed well. No signs of infection at all. Doesn't even need wound care nursing anymore since it's so much better. Likely 2/2 to venous statis. Continue lasix.

## 2014-01-21 NOTE — Assessment & Plan Note (Signed)
Refilled ppi.

## 2014-01-21 NOTE — Assessment & Plan Note (Signed)
Refilled maloxicam.

## 2014-01-21 NOTE — Progress Notes (Signed)
   Subjective:    Patient ID: Nicholas [name], male    DOB: 11-14-1934, 79 y.o.   MRN: [id number]  HPI  79 yo male with HTN, GERD, HLD, OA, blindness, L4-L5, L5-S1 stenosis. Here for follow up of test results.  Had multiple wounds on the right leg with increased edema in the past visits. Obtained ABI, ECHO, checked for diabetes since he had poor wound healing and lower extremity edema.  ABI showing Right ABI 0.9 and left 0.8. Indicating mild arterial disease, L worse than R. Echo showing 55-60% EF no regional wall motion abnormalities, dilation of left atrium and right atrium, mild pulmonary HTN.   Received wound care nursing for right leg wound, it's healing very well.  Leg swelling is lot better, he is able to put on shoes now which he couldn't before. Denies any sob, cp, cough, n/v, fever, chills, headache, numbness, weakness, tingling.  Continues to have chronic hip pain. Wants refill of maloxicam. maloxicam helps somewhat.    Review of Systems  Constitutional: Negative for fever, chills, activity change, appetite change and fatigue.  HENT: Negative for congestion, drooling, ear discharge, ear pain, nosebleeds, postnasal drip, rhinorrhea, sinus pressure and sore throat.   Eyes: Negative for pain and discharge.  Respiratory: Negative for cough, chest tightness, shortness of breath and wheezing.   Cardiovascular: Positive for leg swelling. Negative for chest pain and palpitations.  Gastrointestinal: Negative for nausea, diarrhea, constipation, abdominal distention and anal bleeding.  Genitourinary: Negative for dysuria, urgency, hematuria, decreased urine volume and difficulty urinating.  Musculoskeletal: Positive for arthralgias and back pain. Negative for joint swelling, neck pain and neck stiffness.  Skin: Negative.   Allergic/Immunologic: Negative.   Neurological: Negative for dizziness, seizures, syncope, speech difficulty, weakness, light-headedness, numbness and headaches.    Hematological: Negative.   Psychiatric/Behavioral: Negative.        Objective:   Physical Exam  Constitutional: He is oriented to person, place, and time. He appears well-developed and well-nourished. No distress.  HENT:  Head: Normocephalic and atraumatic.  Right Ear: External ear normal.  Left Ear: External ear normal.  Nose: Nose normal.  Mouth/Throat: Oropharynx is clear and moist.  Eyes: Right eye exhibits no discharge. Left eye exhibits no discharge. No scleral icterus.  Blind.  Neck: Normal range of motion. Neck supple. No JVD present. No thyromegaly present.  Cardiovascular: Normal rate, regular rhythm, S1 normal, S2 normal, normal heart sounds and intact distal pulses.  Exam reveals no gallop and no friction rub.   No murmur heard. Pulmonary/Chest: Effort normal and breath sounds normal. No respiratory distress. He has no wheezes. He has no rales. He exhibits no tenderness.  Abdominal: Soft. Bowel sounds are normal. He exhibits no distension and no mass. There is no tenderness. There is no rebound and no guarding.  Musculoskeletal: Normal range of motion. He exhibits no edema and no tenderness.  Has 2+ pitting edema on BLE upto shins.  Has 2 wounds on RLE extremity anteriorly on the ankle area that's healing very well. No erythema, discharge, warmth.   Lymphadenopathy:    He has no cervical adenopathy.  Neurological: He is alert and oriented to person, place, and time. He has normal strength and normal reflexes. No sensory deficit.  Skin: No rash noted. He is not diaphoretic. No erythema. No pallor.  Psychiatric: He has a normal mood and affect. His behavior is normal.        Assessment & Plan:  See problem based a&p.

## 2014-01-21 NOTE — Assessment & Plan Note (Signed)
Last echo 01/05/2014 shows stable diastolic CHF with right and left atrium dilation, EF 55-60%.

## 2014-01-21 NOTE — Patient Instructions (Signed)
Take your medicines are you are doing right now.  Follow up in 1 month to see how things are going.  Keep exercising and eating healthy diet.  General Instructions:   Thank you for bringing your medicines today. This helps [place] keep you safe from mistakes.   Progress Toward Treatment Goals:  Treatment Goal 10/22/2013  Blood pressure at goal    Self Care Goals & Plans:  Self Care Goal 01/21/2014  Manage my medications take my medicines as prescribed; bring my medications to every visit; refill my medications on time  Monitor my health -  Eat healthy foods eat foods that are low in salt; eat baked foods instead of fried foods; eat fruit for snacks and desserts; eat more vegetables  Be physically active find an activity I enjoy  Meeting treatment goals -    No flowsheet data found.   Care Management & Community Referrals:  Referral 09/09/2013  Referrals made for care management support none needed  Referrals made to community resources -

## 2014-01-24 ENCOUNTER — Other Ambulatory Visit: Payer: Self-pay | Admitting: Internal Medicine

## 2014-01-27 ENCOUNTER — Encounter (HOSPITAL_BASED_OUTPATIENT_CLINIC_OR_DEPARTMENT_OTHER): Payer: PRIVATE HEALTH INSURANCE | Attending: General Surgery

## 2014-01-27 NOTE — Addendum Note (Signed)
Addended by: Hulan Fray on: 01/27/2014 05:21 PM   Modules accepted: Orders

## 2014-02-04 NOTE — Progress Notes (Signed)
Medicine attending: I personally interviewed and reviewed medical history, presenting problems, lab database, and medications, with resident physician Dr. Dellia Nims on the day of the patient's visit and I concurred with his evaluation and management plan. Due to a computer issue, noted not coated to my box until today. Murriel Hopper, M.D., [place]

## 2014-02-10 ENCOUNTER — Telehealth: Payer: Self-pay | Admitting: Pharmacist

## 2014-02-10 NOTE — Telephone Encounter (Signed)
[facility] REFERRAL TO CLINICAL PHARMACY  Nicholas [name] is a 79 y.o. male who was reviewed by clinical pharmacy for medication adherence.   A/P:  Thank you for including me in [facility] care.   Patient reports good adherence and no concerns at this time.  Reviewed and updated medication list.  Patient advised to contact me if any further concerns arise.

## 2014-02-19 ENCOUNTER — Other Ambulatory Visit: Payer: Self-pay | Admitting: *Deleted

## 2014-02-19 MED ORDER — GABAPENTIN 600 MG PO TABS: 600.0000 mg | ORAL_TABLET | Freq: Two times a day (BID) | ORAL | Status: DC

## 2014-03-18 ENCOUNTER — Ambulatory Visit (INDEPENDENT_AMBULATORY_CARE_PROVIDER_SITE_OTHER): Payer: PRIVATE HEALTH INSURANCE | Admitting: Internal Medicine

## 2014-03-18 ENCOUNTER — Encounter: Payer: Self-pay | Admitting: Internal Medicine

## 2014-03-18 VITALS — BP 177/69 | HR 56 | Temp 98.1°F | Ht 71.0 in | Wt 246.8 lb

## 2014-03-18 DIAGNOSIS — I10 Essential (primary) hypertension: Secondary | ICD-10-CM

## 2014-03-18 DIAGNOSIS — M150 Primary generalized (osteo)arthritis: Secondary | ICD-10-CM

## 2014-03-18 DIAGNOSIS — Z0000 Encounter for general adult medical examination without abnormal findings: Secondary | ICD-10-CM

## 2014-03-18 DIAGNOSIS — M159 Polyosteoarthritis, unspecified: Secondary | ICD-10-CM

## 2014-03-18 MED ORDER — HYDROCODONE-ACETAMINOPHEN 10-325 MG PO TABS: 1.0000 | ORAL_TABLET | Freq: Four times a day (QID) | ORAL | Status: DC | PRN

## 2014-03-18 MED ORDER — HYDROCHLOROTHIAZIDE 12.5 MG PO CAPS: 12.5000 mg | ORAL_CAPSULE | Freq: Every day | ORAL | Status: DC

## 2014-03-18 NOTE — Progress Notes (Signed)
INTERNAL MEDICINE TEACHING ATTENDING ADDENDUM - Aldine Contes, MD: I personally saw and evaluated Mr. Mooradian in this clinic visit in conjunction with the resident, Dr. Ethelene Hal. I have discussed patient's plan of care with medical resident during this visit. I have confirmed the physical exam findings and have read and agree with the clinic note including the plan with the following addition: - BP elevated. Pt is non compliant with norvasc and lasix - Will start HCTZ today. Norvasc and lasix dc'd (pt is non compliant) - c/w pain control for now

## 2014-03-18 NOTE — Assessment & Plan Note (Signed)
BP Readings from Last 3 Encounters:  03/18/14 177/69  01/21/14 118/68  12/24/13 110/69    Lab Results  Component Value Date   NA 141 12/19/2013   K 4.9 12/19/2013   CREATININE 1.22 12/19/2013    Assessment: Blood pressure control: moderately elevated Progress toward BP goal:  deteriorated Comments: Mr Si has not been taking his amlodipine as he thinks it makes his legs swell. He is not taking his lasix everyday as it makes him urinate. Review of previous echo in 12/2013 does not show clear reason for lasix. He had EF 90-24% with no systolic or diastolic dysfunction noted. He is not fluid overloaded on exam.  Plan: Medications:  We will discontinue the amlodipine 5 mg daily, lasix 40 mg daily and begin HCTZ 12.5 mg daily. We will continue the enalapril 40 mg daily and carvedilol 25 mg BID Educational resources provided:   Self management tools provided:   Other plans: recheck BP and BMP in one month

## 2014-03-18 NOTE — Assessment & Plan Note (Signed)
Nicholas [name] notes chronic pain in his back. This is unchanged from previous pain for several years per his wife. He has a pain contract with Dr Owens Shark for vicodin 10-325 q6hprn #150/month. We will give him one month Rx today and when he comes for BP check in one month asked him to bring his bottle so we can see how many he is actually taking and adjust as needed.

## 2014-03-18 NOTE — Progress Notes (Signed)
   Subjective:    Patient ID: Karl Bales, male    DOB: [birth year], 79 y.o.   MRN: [id number]  HPI  Mr Wenker is a 79 year old man with HTN, GERD, HL, OA, blindness, L4L% and L5S1 stenosis here for routine care. Please see problem-based charting assessment and plan note for further details of medical issues addressed at today's visit.   Review of Systems  Constitutional: Negative for fever, chills, diaphoresis and activity change.  Respiratory: Negative for cough and shortness of breath.   Cardiovascular: Negative for chest pain and palpitations.  Gastrointestinal: Negative for nausea, vomiting, abdominal pain, diarrhea and constipation.  Musculoskeletal: Positive for back pain and arthralgias.  Neurological: Negative for dizziness, syncope, weakness, light-headedness, numbness and headaches.       Objective:   Physical Exam  Constitutional: He is oriented to person, place, and time. He appears well-developed and well-nourished. No distress.  HENT:  Head: Normocephalic and atraumatic.  Mouth/Throat: Oropharynx is clear and moist.  Eyes: EOM are normal. Pupils are equal, round, and reactive to light.  Cardiovascular: Normal rate, regular rhythm, normal heart sounds and intact distal pulses.  Exam reveals no gallop and no friction rub.   No murmur heard. Pulmonary/Chest: Effort normal and breath sounds normal. No respiratory distress. He has no wheezes.  Abdominal: Soft. Bowel sounds are normal. He exhibits no distension. There is no tenderness.  Musculoskeletal: He exhibits no edema.  No tenderness on spine palpation.  Neurological: He is alert and oriented to person, place, and time. No cranial nerve deficit.  Skin: He is not diaphoretic.  Vitals reviewed.         Assessment & Plan:

## 2014-03-18 NOTE — Assessment & Plan Note (Signed)
We gave Mr Knoedler Rx for zostavax today.

## 2014-03-18 NOTE — Patient Instructions (Signed)
It was a pleasure to see you today. We have given you a new blood pressure medication (hydrochlorothiazide 12.5 mg daily). Please stop taking the lasix and amlodipine. We will recheck your blood pressure in one month.  Please return to clinic or seek medical attention if you have any new or worsening chest pain, shortness of breath, or other worrisome medical condition. We look forward to seeing you again in one month.  [name], MD  General Instructions:   Thank you for bringing your medicines today. This helps [place] keep you safe from mistakes.   Progress Toward Treatment Goals:  Treatment Goal 03/18/2014  Blood pressure deteriorated    Self Care Goals & Plans:  Self Care Goal 03/18/2014  Manage my medications take my medicines as prescribed; refill my medications on time; bring my medications to every visit  Monitor my health -  Eat healthy foods eat more vegetables; eat foods that are low in salt; eat baked foods instead of fried foods  Be physically active find an activity I enjoy  Meeting treatment goals -    No flowsheet data found.   Care Management & Community Referrals:  Referral 09/09/2013  Referrals made for care management support none needed  Referrals made to community resources -

## 2014-04-01 ENCOUNTER — Telehealth: Payer: Self-pay | Admitting: *Deleted

## 2014-04-01 NOTE — Telephone Encounter (Signed)
Esmond Harps called upset about Rx from 03/18/14 from Dr Ethelene Hal for [facility]. Talked to [place] at [facility] last refill 02/27/14 #150 should last 38 days according to directions. Rx can be filled 04/02/14 - open [place] states  the doctors in clinic that he could take extra as needed. Suggest to keep count of extra Rx pain med. States he has been out of med for pain. She states pt needs to come back next month. Hilda Blades Rangel Echeverri RN 04/01/14 11AM

## 2014-04-10 ENCOUNTER — Other Ambulatory Visit: Payer: Self-pay | Admitting: Internal Medicine

## 2014-04-17 ENCOUNTER — Encounter: Payer: Self-pay | Admitting: Internal Medicine

## 2014-04-17 ENCOUNTER — Ambulatory Visit (INDEPENDENT_AMBULATORY_CARE_PROVIDER_SITE_OTHER): Payer: PRIVATE HEALTH INSURANCE | Admitting: Internal Medicine

## 2014-04-17 VITALS — BP 142/67 | HR 89 | Temp 98.3°F | Ht 67.0 in | Wt 270.6 lb

## 2014-04-17 DIAGNOSIS — M150 Primary generalized (osteo)arthritis: Secondary | ICD-10-CM

## 2014-04-17 DIAGNOSIS — M159 Polyosteoarthritis, unspecified: Secondary | ICD-10-CM

## 2014-04-17 DIAGNOSIS — I10 Essential (primary) hypertension: Secondary | ICD-10-CM

## 2014-04-17 LAB — BASIC METABOLIC PANEL WITH GFR
BUN: 22 mg/dL (ref 6–23)
CALCIUM: 8.8 mg/dL (ref 8.4–10.5)
CO2: 25 meq/L (ref 19–32)
Chloride: 102 mEq/L (ref 96–112)
Creat: 1.05 mg/dL (ref 0.50–1.35)
GFR, EST AFRICAN AMERICAN: 79 mL/min
GFR, Est Non African American: 68 mL/min
GLUCOSE: 127 mg/dL — AB (ref 70–99)
Potassium: 4.1 mEq/L (ref 3.5–5.3)
SODIUM: 140 meq/L (ref 135–145)

## 2014-04-17 MED ORDER — HYDROCODONE-ACETAMINOPHEN 10-325 MG PO TABS: 1.0000 | ORAL_TABLET | Freq: Four times a day (QID) | ORAL | Status: DC | PRN

## 2014-04-17 MED ORDER — ALBUTEROL SULFATE HFA 108 (90 BASE) MCG/ACT IN AERS: 2.0000 | INHALATION_SPRAY | Freq: Four times a day (QID) | RESPIRATORY_TRACT | Status: DC | PRN

## 2014-04-17 MED ORDER — DICLOFENAC SODIUM 1 % TD GEL: TRANSDERMAL | Status: DC

## 2014-04-17 MED ORDER — FLUTICASONE-SALMETEROL 100-50 MCG/DOSE IN AEPB: 1.0000 | INHALATION_SPRAY | Freq: Two times a day (BID) | RESPIRATORY_TRACT | Status: DC
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
## 2014-04-17 NOTE — Patient Instructions (Signed)
General Instructions:   Please bring your medicines with you each time you come to clinic.  Medicines may include prescription medications, over-the-counter medications, herbal remedies, eye drops, vitamins, or other pills.   Progress Toward Treatment Goals:  Treatment Goal 03/18/2014  Blood pressure deteriorated    Self Care Goals & Plans:  Self Care Goal 04/17/2014  Manage my medications take my medicines as prescribed; bring my medications to every visit; refill my medications on time  Monitor my health -  Eat healthy foods drink diet soda or water instead of juice or soda; eat more vegetables; eat foods that are low in salt; eat baked foods instead of fried foods; eat fruit for snacks and desserts  Be physically active -  Meeting treatment goals -    No flowsheet data found.   Care Management & Community Referrals:  Referral 09/09/2013  Referrals made for care management support none needed  Referrals made to community resources -

## 2014-04-17 NOTE — Assessment & Plan Note (Addendum)
BP Readings from Last 3 Encounters:  04/17/14 142/67  03/18/14 177/69  01/21/14 118/68    Lab Results  Component Value Date   NA 141 12/19/2013   K 4.9 12/19/2013   CREATININE 1.22 12/19/2013    Assessment: Blood pressure control: controlled Progress toward BP goal:  at goal Comments: Pt taking HCTZ 12.72m daily, enalapril 40mg  daily, and carvedilol 25mg  BID.   Plan: Medications:  continue current medications: If his BP is elevated at his next visit, can increase HCTZ to 25mg  Educational resources provided: brochure (has information) Self management tools provided:   Other plans: Checking BMP today 1 mo after starting diuretic.

## 2014-04-20 NOTE — Progress Notes (Signed)
Internal Medicine Clinic Attending  Case discussed with Dr. Glenn soon after the resident saw the patient.  We reviewed the resident's history and exam and pertinent patient test results.  I agree with the assessment, diagnosis, and plan of care documented in the resident's note. 

## 2014-04-20 NOTE — Addendum Note (Signed)
Addended by: Gilles Chiquito B on: 04/20/2014 09:52 AM   Modules accepted: Level of Service

## 2014-04-26 ENCOUNTER — Other Ambulatory Visit: Payer: Self-pay | Admitting: Internal Medicine

## 2014-05-21 MED ORDER — HYDROCODONE-ACETAMINOPHEN 10-325 MG PO TABS: 1.0000 | ORAL_TABLET | Freq: Four times a day (QID) | ORAL | Status: DC | PRN

## 2014-05-21 NOTE — Addendum Note (Signed)
Addended by: Otho Bellows on: 05/21/2014 04:46 PM   Modules accepted: Orders

## 2014-07-02 ENCOUNTER — Other Ambulatory Visit: Payer: Self-pay | Admitting: *Deleted

## 2014-07-02 DIAGNOSIS — I10 Essential (primary) hypertension: Secondary | ICD-10-CM

## 2014-07-02 DIAGNOSIS — K219 Gastro-esophageal reflux disease without esophagitis: Secondary | ICD-10-CM

## 2014-07-02 MED ORDER — MELOXICAM 7.5 MG PO TABS: ORAL_TABLET | ORAL | Status: DC

## 2014-07-02 MED ORDER — HYDROCHLOROTHIAZIDE 12.5 MG PO CAPS: 12.5000 mg | ORAL_CAPSULE | Freq: Every day | ORAL | Status: DC

## 2014-07-02 MED ORDER — PANTOPRAZOLE SODIUM 20 MG PO TBEC: 20.0000 mg | DELAYED_RELEASE_TABLET | Freq: Every day | ORAL | Status: DC

## 2014-07-02 NOTE — Telephone Encounter (Signed)
90 day supply PLEASE

## 2014-07-06 ENCOUNTER — Encounter: Payer: Self-pay | Admitting: *Deleted

## 2014-07-08 ENCOUNTER — Ambulatory Visit (INDEPENDENT_AMBULATORY_CARE_PROVIDER_SITE_OTHER): Payer: Medicare HMO | Admitting: Internal Medicine

## 2014-07-08 ENCOUNTER — Encounter: Payer: Self-pay | Admitting: Internal Medicine

## 2014-07-08 VITALS — BP 126/58 | HR 70 | Temp 98.2°F | Resp 20 | Ht 69.0 in | Wt 253.2 lb

## 2014-07-08 DIAGNOSIS — Z23 Encounter for immunization: Secondary | ICD-10-CM

## 2014-07-08 DIAGNOSIS — M159 Polyosteoarthritis, unspecified: Secondary | ICD-10-CM

## 2014-07-08 DIAGNOSIS — Z0000 Encounter for general adult medical examination without abnormal findings: Secondary | ICD-10-CM | POA: Diagnosis not present

## 2014-07-08 DIAGNOSIS — I10 Essential (primary) hypertension: Secondary | ICD-10-CM | POA: Diagnosis not present

## 2014-07-08 DIAGNOSIS — M150 Primary generalized (osteo)arthritis: Secondary | ICD-10-CM | POA: Diagnosis not present

## 2014-07-08 MED ORDER — HYDROCODONE-ACETAMINOPHEN 10-325 MG PO TABS: 1.0000 | ORAL_TABLET | Freq: Four times a day (QID) | ORAL | Status: DC | PRN
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
## 2014-07-08 NOTE — Patient Instructions (Signed)
It was a pleasure to see you today. Please go to your colonoscopy. We made no medicine changes. Please return to clinic or seek medical attention if you have any new or worsening trouble breathing, chest pain, or other worrisome medical condition. We look forward to seeing you again in 3 months.  [name], MD

## 2014-07-08 NOTE — Assessment & Plan Note (Signed)
He is to receive Prevnar vaccine today. He had colonoscopy with [facility] in 12/2008 which was normal but asked for repeat in 5 years. He has agreed for repeat and will receive referral

## 2014-07-08 NOTE — Progress Notes (Signed)
Internal Medicine Clinic Attending  Case discussed with Dr. Rothman at the time of the visit.  We reviewed the resident's history and exam and pertinent patient test results.  I agree with the assessment, diagnosis, and plan of care documented in the resident's note. 

## 2014-07-08 NOTE — Assessment & Plan Note (Signed)
BP Readings from Last 3 Encounters:  07/08/14 126/58  04/17/14 142/67  03/18/14 177/69    Lab Results  Component Value Date   NA 140 04/17/2014   K 4.1 04/17/2014   CREATININE 1.05 04/17/2014    Assessment: Blood pressure control: controlled Progress toward BP goal:  at goal Comments: controlled on HCTZ 12.5 mg daily, enalapril 40 mg daily, and carvedilol 25 mg bid  Plan: Medications:  continue current medications Educational resources provided: brochure Self management tools provided: home blood pressure logbook Other plans: if needed in future, consider increasing HCTZ to 25 mg daily

## 2014-07-31 ENCOUNTER — Other Ambulatory Visit: Payer: Self-pay | Admitting: Internal Medicine

## 2014-08-13 NOTE — Progress Notes (Signed)
   Subjective:    Patient ID: Nicholas [name], male    DOB: 13-Dec-1934, 80 y.o.   MRN: [id number]  HPI  ADDENDUM: Addendum created to document ROS and Physical Exam   [name], MD Internal Medicine, PGY-1 Pager [phone] 08/13/2014, 11:03 AM   Review of Systems  Constitutional: Negative for fever, chills and diaphoresis.  Respiratory: Negative for cough and shortness of breath.   Cardiovascular: Negative for chest pain.  Gastrointestinal: Negative for nausea, vomiting, abdominal pain, diarrhea and constipation.  Neurological: Negative for weakness, light-headedness, numbness and headaches.       Objective:   Physical Exam  Constitutional: He is oriented to person, place, and time. He appears well-developed and well-nourished. No distress.  HENT:  Head: Normocephalic and atraumatic.  Mouth/Throat: Oropharynx is clear and moist.  Eyes: EOM are normal. Pupils are equal, round, and reactive to light.  Cardiovascular: Normal rate, regular rhythm, normal heart sounds and intact distal pulses.  Exam reveals no gallop and no friction rub.   No murmur heard. Pulmonary/Chest: Effort normal and breath sounds normal. No respiratory distress. He has no wheezes.  Abdominal: Soft. Bowel sounds are normal. He exhibits no distension. There is no tenderness.  Neurological: He is alert and oriented to person, place, and time.  Skin: He is not diaphoretic.  Vitals reviewed.         Assessment & Plan:

## 2014-08-17 ENCOUNTER — Telehealth: Payer: Self-pay | Admitting: *Deleted

## 2014-08-17 NOTE — Telephone Encounter (Signed)
Pt called wanted an appt to check pain in upper left chest area and left arm.  Pt states at one time - was told arthritis. Suggest to go to ER or  Urgent Care to be checked. At the time of conversation - there were no open appt for the week. Hilda Blades Yamil Oelke RN 08/17/14 10:45AM

## 2014-08-25 ENCOUNTER — Emergency Department (HOSPITAL_COMMUNITY): Payer: Medicare HMO

## 2014-08-25 ENCOUNTER — Encounter (HOSPITAL_COMMUNITY): Payer: Self-pay

## 2014-08-25 ENCOUNTER — Emergency Department (INDEPENDENT_AMBULATORY_CARE_PROVIDER_SITE_OTHER)
Admission: EM | Admit: 2014-08-25 | Discharge: 2014-08-25 | Disposition: A | Discharge status: Other Acute Inpatient Hospital | Payer: Medicare HMO | Source: Home / Self Care | Attending: Emergency Medicine | Admitting: Emergency Medicine

## 2014-08-25 ENCOUNTER — Encounter (HOSPITAL_COMMUNITY): Payer: Self-pay | Admitting: *Deleted

## 2014-08-25 ENCOUNTER — Emergency Department (HOSPITAL_COMMUNITY)
Admission: EM | Admit: 2014-08-25 | Discharge: 2014-08-25 | Disposition: A | Discharge status: Home / Self Care | Payer: Medicare HMO | Attending: Emergency Medicine | Admitting: Emergency Medicine

## 2014-08-25 DIAGNOSIS — Z792 Long term (current) use of antibiotics: Secondary | ICD-10-CM | POA: Diagnosis not present

## 2014-08-25 DIAGNOSIS — Z87438 Personal history of other diseases of male genital organs: Secondary | ICD-10-CM | POA: Diagnosis not present

## 2014-08-25 DIAGNOSIS — I10 Essential (primary) hypertension: Secondary | ICD-10-CM | POA: Insufficient documentation

## 2014-08-25 DIAGNOSIS — Z79899 Other long term (current) drug therapy: Secondary | ICD-10-CM | POA: Insufficient documentation

## 2014-08-25 DIAGNOSIS — J449 Chronic obstructive pulmonary disease, unspecified: Secondary | ICD-10-CM | POA: Insufficient documentation

## 2014-08-25 DIAGNOSIS — M1990 Unspecified osteoarthritis, unspecified site: Secondary | ICD-10-CM | POA: Insufficient documentation

## 2014-08-25 DIAGNOSIS — I5032 Chronic diastolic (congestive) heart failure: Secondary | ICD-10-CM | POA: Insufficient documentation

## 2014-08-25 DIAGNOSIS — M25512 Pain in left shoulder: Secondary | ICD-10-CM | POA: Diagnosis present

## 2014-08-25 DIAGNOSIS — R079 Chest pain, unspecified: Secondary | ICD-10-CM

## 2014-08-25 DIAGNOSIS — Z87891 Personal history of nicotine dependence: Secondary | ICD-10-CM | POA: Insufficient documentation

## 2014-08-25 DIAGNOSIS — K219 Gastro-esophageal reflux disease without esophagitis: Secondary | ICD-10-CM | POA: Diagnosis not present

## 2014-08-25 DIAGNOSIS — R918 Other nonspecific abnormal finding of lung field: Secondary | ICD-10-CM | POA: Diagnosis not present

## 2014-08-25 DIAGNOSIS — H548 Legal blindness, as defined in USA: Secondary | ICD-10-CM | POA: Diagnosis not present

## 2014-08-25 LAB — COMPREHENSIVE METABOLIC PANEL
ALT: 12 U/L (ref 0–53)
ANION GAP: 9 (ref 5–15)
AST: 14 U/L (ref 0–37)
Albumin: 3.1 g/dL — ABNORMAL LOW (ref 3.5–5.2)
Alkaline Phosphatase: 80 U/L (ref 39–117)
BUN: 23 mg/dL (ref 6–23)
CO2: 29 mmol/L (ref 19–32)
Calcium: 8.4 mg/dL (ref 8.4–10.5)
Chloride: 100 mmol/L (ref 96–112)
Creatinine, Ser: 1.27 mg/dL (ref 0.50–1.35)
GFR, EST AFRICAN AMERICAN: 61 mL/min — AB (ref >90)
GFR, EST NON AFRICAN AMERICAN: 53 mL/min — AB (ref >90)
Glucose, Bld: 100 mg/dL — ABNORMAL HIGH (ref 70–99)
POTASSIUM: 3.6 mmol/L (ref 3.5–5.1)
SODIUM: 138 mmol/L (ref 135–145)
TOTAL PROTEIN: 7.3 g/dL (ref 6.0–8.3)
Total Bilirubin: 0.5 mg/dL (ref 0.3–1.2)

## 2014-08-25 LAB — I-STAT CHEM 8, ED
BUN: 26 mg/dL — ABNORMAL HIGH (ref 6–23)
Calcium, Ion: 1.11 mmol/L — ABNORMAL LOW (ref 1.13–1.30)
Chloride: 97 mmol/L (ref 96–112)
Creatinine, Ser: 1.2 mg/dL (ref 0.50–1.35)
GLUCOSE: 95 mg/dL (ref 70–99)
HEMATOCRIT: 35 % — AB (ref 39.0–52.0)
HEMOGLOBIN: 11.9 g/dL — AB (ref 13.0–17.0)
POTASSIUM: 3.6 mmol/L (ref 3.5–5.1)
SODIUM: 140 mmol/L (ref 135–145)
TCO2: 24 mmol/L (ref 0–100)

## 2014-08-25 MED ORDER — KETOROLAC TROMETHAMINE 30 MG/ML IJ SOLN
15.0000 mg | Freq: Once | INTRAMUSCULAR | Status: AC
  Administered 2014-08-25: 15 mg via INTRAVENOUS
  Filled 2014-08-25: qty 1

## 2014-08-25 MED ORDER — ASPIRIN 81 MG PO CHEW
324.0000 mg | CHEWABLE_TABLET | Freq: Once | ORAL | Status: AC
  Administered 2014-08-25: 324 mg via ORAL

## 2014-08-25 MED ORDER — SODIUM CHLORIDE 0.9 % IV BOLUS (SEPSIS)
500.0000 mL | Freq: Once | INTRAVENOUS | Status: AC
  Administered 2014-08-25: 500 mL via INTRAVENOUS

## 2014-08-25 MED ORDER — ASPIRIN 81 MG PO CHEW
CHEWABLE_TABLET | ORAL | Status: AC
  Filled 2014-08-25: qty 4

## 2014-08-25 MED ORDER — SODIUM CHLORIDE 0.9 % IV SOLN
INTRAVENOUS | Status: DC
  Administered 2014-08-25: 12:00:00 via INTRAVENOUS

## 2014-08-25 MED ORDER — IOHEXOL 300 MG/ML  SOLN
100.0000 mL | Freq: Once | INTRAMUSCULAR | Status: AC | PRN
  Administered 2014-08-25: 100 mL via INTRAVENOUS
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
## 2014-08-25 NOTE — ED Notes (Signed)
Ems  At  The  scene

## 2014-08-25 NOTE — ED Provider Notes (Signed)
CT concerning for new cancerous mass. D/w Dr. Jana Hakim, he will arrange close clinic f/u as well as biopsy. Also d/w Internal medicine teaching service, they say he should be able to get an acute care appointment tomorrow. Went over results with patient and importance of f/u and he and family member verbalized understanding.   Sherwood Gambler, MD 08/26/14 [phone]

## 2014-08-25 NOTE — ED Notes (Signed)
Placed   On  Cardiac  Monitor   Nasal  02  At  2 l  /  Min     Iv  Infusing  Well  Site   Patent
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
## 2014-08-25 NOTE — ED Notes (Signed)
Transfer from UC with bilateral shoulder pain, more prominent in left shoulder. No cardiac hx other than hypertension and high cholesterol. 12 lead unremarkable. 324 of ASA given at UC. 20g IV right hand, pain free on arrival.

## 2014-08-25 NOTE — ED Notes (Signed)
Pt  Is  Blind    -  He  Reports  Symptoms  Of     Upper  Back  And  Bilateral  Shoulder    Pain         X  sev  Weeks      Pt  denys  Any  specefic  Injury         He  Ambulated  To  Room  With  Slow  Steady  Gait          He  denys  Any  Chest pain or  Shortness  Of  Breath        He  Also  Reports  Some  Swelling of  extremitys       He  Reports  The  r  More   Than the  Left       He  Is  Sitting upright  On  Exam table  Speaking in  Complete  sentances

## 2014-08-25 NOTE — ED Notes (Signed)
Care     Link   Has   No  Unit  To  send

## 2014-08-26 ENCOUNTER — Telehealth: Payer: Self-pay | Admitting: *Deleted

## 2014-08-26 NOTE — Telephone Encounter (Signed)
Called patient to set up to be seen at thoracic clinic on 08/27/14 arrive at 2:45.  Patient verbalized understanding of appt time and place.

## 2014-08-27 ENCOUNTER — Telehealth: Payer: Self-pay | Admitting: *Deleted

## 2014-08-27 ENCOUNTER — Other Ambulatory Visit: Payer: Self-pay | Admitting: *Deleted

## 2014-08-27 ENCOUNTER — Encounter: Payer: Self-pay | Admitting: Thoracic Surgery (Cardiothoracic Vascular Surgery)

## 2014-08-27 ENCOUNTER — Institutional Professional Consult (permissible substitution) (INDEPENDENT_AMBULATORY_CARE_PROVIDER_SITE_OTHER): Payer: Medicare HMO | Admitting: Thoracic Surgery (Cardiothoracic Vascular Surgery)

## 2014-08-27 VITALS — BP 170/67 | HR 66 | Temp 98.3°F | Resp 18 | Wt 248.4 lb

## 2014-08-27 DIAGNOSIS — R918 Other nonspecific abnormal finding of lung field: Secondary | ICD-10-CM

## 2014-08-27 DIAGNOSIS — Z8719 Personal history of other diseases of the digestive system: Secondary | ICD-10-CM

## 2014-08-27 NOTE — Telephone Encounter (Signed)
I received a phone call from Dr. Roxan Hockey. He is able to see patient at cancer center at 3:30.  I called and spoke with his wife.  She stated that would be ok and patient will be there.
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
## 2014-08-27 NOTE — Telephone Encounter (Signed)
Called patient to remind him of appt.  He verbalized understanding of appt time and place

## 2014-08-31 ENCOUNTER — Ambulatory Visit: Payer: Medicare HMO | Admitting: Internal Medicine

## 2014-09-02 ENCOUNTER — Encounter (HOSPITAL_COMMUNITY)
Admission: RE | Admit: 2014-09-02 | Discharge: 2014-09-02 | Disposition: A | Discharge status: Home / Self Care | Payer: Medicare HMO | Source: Ambulatory Visit | Attending: Thoracic Surgery (Cardiothoracic Vascular Surgery) | Admitting: Thoracic Surgery (Cardiothoracic Vascular Surgery)

## 2014-09-02 ENCOUNTER — Other Ambulatory Visit (HOSPITAL_COMMUNITY): Payer: Self-pay | Admitting: *Deleted

## 2014-09-02 ENCOUNTER — Encounter (HOSPITAL_COMMUNITY): Payer: Self-pay

## 2014-09-02 VITALS — BP 142/50 | HR 55 | Temp 98.3°F | Resp 18 | Ht 71.0 in | Wt 250.3 lb

## 2014-09-02 DIAGNOSIS — I10 Essential (primary) hypertension: Secondary | ICD-10-CM | POA: Diagnosis not present

## 2014-09-02 DIAGNOSIS — M1990 Unspecified osteoarthritis, unspecified site: Secondary | ICD-10-CM | POA: Diagnosis not present

## 2014-09-02 DIAGNOSIS — H540 Blindness, both eyes: Secondary | ICD-10-CM | POA: Diagnosis not present

## 2014-09-02 DIAGNOSIS — N529 Male erectile dysfunction, unspecified: Secondary | ICD-10-CM | POA: Diagnosis not present

## 2014-09-02 DIAGNOSIS — Z888 Allergy status to other drugs, medicaments and biological substances status: Secondary | ICD-10-CM | POA: Diagnosis not present

## 2014-09-02 DIAGNOSIS — J449 Chronic obstructive pulmonary disease, unspecified: Secondary | ICD-10-CM | POA: Diagnosis not present

## 2014-09-02 DIAGNOSIS — R918 Other nonspecific abnormal finding of lung field: Secondary | ICD-10-CM

## 2014-09-02 DIAGNOSIS — E785 Hyperlipidemia, unspecified: Secondary | ICD-10-CM | POA: Diagnosis not present

## 2014-09-02 DIAGNOSIS — I5032 Chronic diastolic (congestive) heart failure: Secondary | ICD-10-CM | POA: Diagnosis not present

## 2014-09-02 DIAGNOSIS — Z79899 Other long term (current) drug therapy: Secondary | ICD-10-CM | POA: Diagnosis not present

## 2014-09-02 DIAGNOSIS — Z87891 Personal history of nicotine dependence: Secondary | ICD-10-CM | POA: Diagnosis not present

## 2014-09-02 DIAGNOSIS — K219 Gastro-esophageal reflux disease without esophagitis: Secondary | ICD-10-CM | POA: Diagnosis not present

## 2014-09-02 DIAGNOSIS — R590 Localized enlarged lymph nodes: Secondary | ICD-10-CM | POA: Diagnosis present

## 2014-09-02 DIAGNOSIS — M109 Gout, unspecified: Secondary | ICD-10-CM | POA: Diagnosis not present

## 2014-09-02 DIAGNOSIS — M4800 Spinal stenosis, site unspecified: Secondary | ICD-10-CM | POA: Diagnosis not present

## 2014-09-02 HISTORY — DX: Depression, unspecified: F32.A

## 2014-09-02 HISTORY — DX: Reserved for inherently not codable concepts without codable children: IMO0001

## 2014-09-02 HISTORY — DX: Major depressive disorder, single episode, unspecified: F32.9

## 2014-09-02 HISTORY — DX: Constipation, unspecified: K59.00

## 2014-09-02 LAB — COMPREHENSIVE METABOLIC PANEL
ALK PHOS: 75 U/L (ref 39–117)
ALT: 16 U/L (ref 0–53)
ANION GAP: 9 (ref 5–15)
AST: 16 U/L (ref 0–37)
Albumin: 2.9 g/dL — ABNORMAL LOW (ref 3.5–5.2)
BUN: 18 mg/dL (ref 6–23)
CO2: 29 mmol/L (ref 19–32)
Calcium: 8.3 mg/dL — ABNORMAL LOW (ref 8.4–10.5)
Chloride: 101 mmol/L (ref 96–112)
Creatinine, Ser: 1.27 mg/dL (ref 0.50–1.35)
GFR calc non Af Amer: 53 mL/min — ABNORMAL LOW (ref >90)
GFR, EST AFRICAN AMERICAN: 61 mL/min — AB (ref >90)
GLUCOSE: 103 mg/dL — AB (ref 70–99)
Potassium: 3.8 mmol/L (ref 3.5–5.1)
Sodium: 139 mmol/L (ref 135–145)
TOTAL PROTEIN: 7.2 g/dL (ref 6.0–8.3)
Total Bilirubin: 0.4 mg/dL (ref 0.3–1.2)

## 2014-09-02 LAB — APTT: APTT: 40 s — AB (ref 24–37)

## 2014-09-02 LAB — CBC
HEMATOCRIT: 30.6 % — AB (ref 39.0–52.0)
Hemoglobin: 9.3 g/dL — ABNORMAL LOW (ref 13.0–17.0)
MCH: 24.8 pg — AB (ref 26.0–34.0)
MCHC: 30.4 g/dL (ref 30.0–36.0)
MCV: 81.6 fL (ref 78.0–100.0)
Platelets: 265 10*3/uL (ref 150–400)
RBC: 3.75 MIL/uL — ABNORMAL LOW (ref 4.22–5.81)
RDW: 16.4 % — ABNORMAL HIGH (ref 11.5–15.5)
WBC: 5.3 10*3/uL (ref 4.0–10.5)

## 2014-09-02 LAB — PROTIME-INR
INR: 1.12 (ref 0.00–1.49)
PROTHROMBIN TIME: 14.5 s (ref 11.6–15.2)

## 2014-09-02 NOTE — Progress Notes (Signed)
Anesthesia Chart Review:  Pt is 80 year old male scheduled for video bronchoscopy with endobronchial navigation on 09/04/2014 with Dr. Roxan Hockey.   PCP is [name].   PMH includes: HTN, hyperlipidemia, chronic diastolic HF, COPD, blindness. Former smoker. BMI 35.   Medications include: carvedilol, enalapril, hctz, albuterol, advair, crestor.   Preoperative labs reviewed.  PTT 40. H/H 9.3/30.6. Notified Ryan in Dr. Leonarda Salon office of low hgb.   Chest x-ray reviewed. Right upper line mass again noted.   EKG 08/25/2014: sinus bradycardia, nonspecific T wave abnormality.  Echo 01/05/2014: - Left ventricle: The cavity size was normal. Wall thickness was normal. Systolic function was normal. The estimated ejection fraction was in the range of 55% to 60%. Wall motion was normal; there were no regional wall motion abnormalities. - Left atrium: The atrium was mildly dilated. - Right atrium: The atrium was mildly dilated. - Pulmonary arteries: Systolic pressure was mildly increased.  If no changes, I anticipate pt can proceed with surgery as scheduled.   Willeen Cass, FNP-BC [facility] Short Stay Surgical Center/Anesthesiology Phone: [phone] 09/02/2014 1:59 PM'

## 2014-09-02 NOTE — Progress Notes (Signed)
   09/02/14 0911  OBSTRUCTIVE SLEEP APNEA  Have you ever been diagnosed with sleep apnea through a sleep study? No  Do you snore loudly (loud enough to be heard through closed doors)?  1  Do you often feel tired, fatigued, or sleepy during the daytime? 1  Has anyone observed you stop breathing during your sleep? 0  Do you have, or are you being treated for high blood pressure? 1  BMI more than 35 kg/m2? 0  Age over 78 years old? 1  Neck circumference greater than 40 cm/16 inches? 1  Gender: 1

## 2014-09-02 NOTE — Pre-Procedure Instructions (Signed)
Nicholas [name]  09/02/2014   Your procedure is scheduled on:  Friday, September 04, 2014 at 1:00 PM.   Report to [facility] Entrance "A" Admitting Office at 11:00 AM.   Call this number if you have problems the morning of surgery: [phone]   Remember:   Do not eat food or drink liquids after midnight Thursday, 09/03/14.   Take these medicines the morning of surgery with A SIP OF WATER: Carvedilol (Coreg), Gabapentin (Neurontin), Pantoprazole (Protonix), Advair inhaler, Hydrocodone - if needed, ProAir inhaler - if needed.   Do not wear jewelry.  Do not wear lotions, powders, or cologne. You may wear deodorant.  Men may shave face and neck.  Do not bring valuables to the hospital.  [facility] is not responsible                  for any belongings or valuables.               Contacts, dentures or bridgework may not be worn into surgery.  Leave suitcase in the car. After surgery it may be brought to your room.  For patients admitted to the hospital, discharge time is determined by your                treatment team.               Patients discharged the day of surgery will not be allowed to drive home.    Special Instructions: [place] - Preparing for Surgery  Before surgery, you can play an important role.  Because skin is not sterile, your skin needs to be as free of germs as possible.  You can reduce the number of germs on you skin by washing with CHG (chlorahexidine gluconate) soap before surgery.  CHG is an antiseptic cleaner which kills germs and bonds with the skin to continue killing germs even after washing.  Please DO NOT use if you have an allergy to CHG or antibacterial soaps.  If your skin becomes reddened/irritated stop using the CHG and inform your nurse when you arrive at Short Stay.  Do not shave (including legs and underarms) for at least 48 hours prior to the first CHG shower.  You may shave your face.  Please follow these instructions carefully:   1.   Shower with CHG Soap the night before surgery and the                                morning of Surgery.  2.  If you choose to wash your hair, wash your hair first as usual with your       normal shampoo.  3.  After you shampoo, rinse your hair and body thoroughly to remove the                      Shampoo.  4.  Use CHG as you would any other liquid soap.  You can apply chg directly       to the skin and wash gently with scrungie or a clean washcloth.  5.  Apply the CHG Soap to your body ONLY FROM THE NECK DOWN.        Do not use on open wounds or open sores.  Avoid contact with your eyes, ears, mouth and genitals (private parts).  Wash genitals (private parts) with your normal soap.  6.  Wash thoroughly,  paying special attention to the area where your surgery        will be performed.  7.  Thoroughly rinse your body with warm water from the neck down.  8.  DO NOT shower/wash with your normal soap after using and rinsing off       the CHG Soap.  9.  Pat yourself dry with a clean towel.            10.  Wear clean pajamas.            11.  Place clean sheets on your bed the night of your first shower and do not        sleep with pets.  Day of Surgery  Do not apply any lotions the morning of surgery.  Please wear clean clothes to the hospital.     Please read over the following fact sheets that you were given: Pain Booklet, Coughing and Deep Breathing and Surgical Site Infection Prevention

## 2014-09-03 ENCOUNTER — Encounter: Payer: Self-pay | Admitting: *Deleted

## 2014-09-04 ENCOUNTER — Ambulatory Visit (HOSPITAL_COMMUNITY): Payer: Medicare HMO

## 2014-09-04 ENCOUNTER — Encounter (HOSPITAL_COMMUNITY): Payer: Self-pay | Admitting: Anesthesiology

## 2014-09-04 ENCOUNTER — Ambulatory Visit (HOSPITAL_COMMUNITY): Payer: Medicare HMO | Admitting: Anesthesiology

## 2014-09-04 ENCOUNTER — Ambulatory Visit (HOSPITAL_COMMUNITY)
Admission: RE | Admit: 2014-09-04 | Discharge: 2014-09-04 | Disposition: A | Discharge status: Home / Self Care | Payer: Medicare HMO | Source: Ambulatory Visit | Attending: Thoracic Surgery (Cardiothoracic Vascular Surgery) | Admitting: Thoracic Surgery (Cardiothoracic Vascular Surgery)

## 2014-09-04 ENCOUNTER — Ambulatory Visit (HOSPITAL_COMMUNITY): Payer: Medicare HMO | Admitting: Emergency Medicine

## 2014-09-04 ENCOUNTER — Encounter (HOSPITAL_COMMUNITY)
Admission: RE | Disposition: A | Discharge status: Home / Self Care | Payer: Self-pay | Source: Ambulatory Visit | Attending: Thoracic Surgery (Cardiothoracic Vascular Surgery)

## 2014-09-04 DIAGNOSIS — H540 Blindness, both eyes: Secondary | ICD-10-CM | POA: Insufficient documentation

## 2014-09-04 DIAGNOSIS — I5032 Chronic diastolic (congestive) heart failure: Secondary | ICD-10-CM | POA: Insufficient documentation

## 2014-09-04 DIAGNOSIS — M109 Gout, unspecified: Secondary | ICD-10-CM | POA: Insufficient documentation

## 2014-09-04 DIAGNOSIS — Z419 Encounter for procedure for purposes other than remedying health state, unspecified: Secondary | ICD-10-CM

## 2014-09-04 DIAGNOSIS — M150 Primary generalized (osteo)arthritis: Secondary | ICD-10-CM

## 2014-09-04 DIAGNOSIS — K219 Gastro-esophageal reflux disease without esophagitis: Secondary | ICD-10-CM | POA: Diagnosis not present

## 2014-09-04 DIAGNOSIS — R918 Other nonspecific abnormal finding of lung field: Secondary | ICD-10-CM | POA: Insufficient documentation

## 2014-09-04 DIAGNOSIS — M4800 Spinal stenosis, site unspecified: Secondary | ICD-10-CM | POA: Insufficient documentation

## 2014-09-04 DIAGNOSIS — M159 Polyosteoarthritis, unspecified: Secondary | ICD-10-CM

## 2014-09-04 DIAGNOSIS — E785 Hyperlipidemia, unspecified: Secondary | ICD-10-CM | POA: Insufficient documentation

## 2014-09-04 DIAGNOSIS — J449 Chronic obstructive pulmonary disease, unspecified: Secondary | ICD-10-CM | POA: Insufficient documentation

## 2014-09-04 DIAGNOSIS — Z87891 Personal history of nicotine dependence: Secondary | ICD-10-CM | POA: Insufficient documentation

## 2014-09-04 DIAGNOSIS — Z79899 Other long term (current) drug therapy: Secondary | ICD-10-CM | POA: Insufficient documentation

## 2014-09-04 DIAGNOSIS — M1990 Unspecified osteoarthritis, unspecified site: Secondary | ICD-10-CM | POA: Insufficient documentation

## 2014-09-04 DIAGNOSIS — I10 Essential (primary) hypertension: Secondary | ICD-10-CM | POA: Insufficient documentation

## 2014-09-04 DIAGNOSIS — R590 Localized enlarged lymph nodes: Secondary | ICD-10-CM | POA: Diagnosis not present

## 2014-09-04 DIAGNOSIS — Z888 Allergy status to other drugs, medicaments and biological substances status: Secondary | ICD-10-CM | POA: Insufficient documentation

## 2014-09-04 DIAGNOSIS — N529 Male erectile dysfunction, unspecified: Secondary | ICD-10-CM | POA: Insufficient documentation

## 2014-09-04 HISTORY — PX: VIDEO BRONCHOSCOPY WITH ENDOBRONCHIAL ULTRASOUND: SHX6177

## 2014-09-04 SURGERY — BRONCHOSCOPY, WITH EBUS
Anesthesia: General | Site: Chest

## 2014-09-04 MED ORDER — ONDANSETRON HCL 4 MG/2ML IJ SOLN
INTRAMUSCULAR | Status: DC | PRN
Start: 2014-09-04 — End: 2014-09-04
  Administered 2014-09-04: 4 mg via INTRAVENOUS

## 2014-09-04 MED ORDER — TRIAMCINOLONE ACETONIDE 0.1 % EX CREA: 1.0000 "application " | TOPICAL_CREAM | Freq: Every day | CUTANEOUS | Status: DC | PRN

## 2014-09-04 MED ORDER — HYDROCODONE-ACETAMINOPHEN 10-325 MG PO TABS: 1.0000 | ORAL_TABLET | Freq: Four times a day (QID) | ORAL | Status: DC | PRN

## 2014-09-04 MED ORDER — ACETAMINOPHEN 325 MG PO TABS: 650.0000 mg | ORAL_TABLET | ORAL | Status: DC | PRN

## 2014-09-04 MED ORDER — VECURONIUM BROMIDE 10 MG IV SOLR
INTRAVENOUS | Status: AC
  Filled 2014-09-04: qty 10

## 2014-09-04 MED ORDER — ONDANSETRON HCL 4 MG/2ML IJ SOLN
INTRAMUSCULAR | Status: AC
  Filled 2014-09-04: qty 2

## 2014-09-04 MED ORDER — VECURONIUM BROMIDE 10 MG IV SOLR
INTRAVENOUS | Status: DC | PRN
  Administered 2014-09-04: 1 mg via INTRAVENOUS
  Administered 2014-09-04: 7 mg via INTRAVENOUS

## 2014-09-04 MED ORDER — EPHEDRINE SULFATE 50 MG/ML IJ SOLN
INTRAMUSCULAR | Status: DC | PRN
  Administered 2014-09-04 (×3): 5 mg via INTRAVENOUS
  Administered 2014-09-04: 10 mg via INTRAVENOUS
  Administered 2014-09-04 (×2): 5 mg via INTRAVENOUS

## 2014-09-04 MED ORDER — LIDOCAINE HCL (CARDIAC) 20 MG/ML IV SOLN
INTRAVENOUS | Status: AC
  Filled 2014-09-04: qty 5

## 2014-09-04 MED ORDER — BACITRACIN 500 UNIT/GM EX OINT: 1.0000 "application " | TOPICAL_OINTMENT | Freq: Every day | CUTANEOUS | Status: DC | PRN

## 2014-09-04 MED ORDER — SODIUM CHLORIDE 0.9 % IJ SOLN: 3.0000 mL | INTRAMUSCULAR | Status: DC | PRN

## 2014-09-04 MED ORDER — OXYCODONE HCL 5 MG/5ML PO SOLN: 5.0000 mg | Freq: Once | ORAL | Status: DC | PRN

## 2014-09-04 MED ORDER — PROPOFOL 10 MG/ML IV BOLUS
INTRAVENOUS | Status: AC
  Filled 2014-09-04: qty 20

## 2014-09-04 MED ORDER — OXYCODONE HCL 5 MG PO TABS: 5.0000 mg | ORAL_TABLET | Freq: Once | ORAL | Status: DC | PRN

## 2014-09-04 MED ORDER — PROPOFOL 10 MG/ML IV BOLUS
INTRAVENOUS | Status: DC | PRN
  Administered 2014-09-04: 4 mg via INTRAVENOUS
  Administered 2014-09-04: 110 mg via INTRAVENOUS
  Administered 2014-09-04: 50 mg via INTRAVENOUS

## 2014-09-04 MED ORDER — FENTANYL CITRATE (PF) 100 MCG/2ML IJ SOLN: 25.0000 ug | INTRAMUSCULAR | Status: DC | PRN

## 2014-09-04 MED ORDER — PHENYLEPHRINE HCL 10 MG/ML IJ SOLN
INTRAMUSCULAR | Status: DC | PRN
  Administered 2014-09-04 (×5): 40 ug via INTRAVENOUS
  Administered 2014-09-04: 80 ug via INTRAVENOUS
  Administered 2014-09-04: 40 ug via INTRAVENOUS

## 2014-09-04 MED ORDER — FENTANYL CITRATE (PF) 100 MCG/2ML IJ SOLN
INTRAMUSCULAR | Status: DC | PRN
  Administered 2014-09-04: 50 ug via INTRAVENOUS

## 2014-09-04 MED ORDER — SODIUM CHLORIDE 0.9 % IV SOLN: 250.0000 mL | INTRAVENOUS | Status: DC | PRN

## 2014-09-04 MED ORDER — MIDAZOLAM HCL 2 MG/2ML IJ SOLN
INTRAMUSCULAR | Status: AC
  Filled 2014-09-04: qty 2

## 2014-09-04 MED ORDER — ACETAMINOPHEN 650 MG RE SUPP: 650.0000 mg | RECTAL | Status: DC | PRN

## 2014-09-04 MED ORDER — PHENYLEPHRINE HCL 10 MG/ML IJ SOLN
10.0000 mg | INTRAVENOUS | Status: DC | PRN
  Administered 2014-09-04: 20 ug/min via INTRAVENOUS

## 2014-09-04 MED ORDER — STERILE WATER FOR INJECTION IJ SOLN
INTRAMUSCULAR | Status: AC
  Filled 2014-09-04: qty 10

## 2014-09-04 MED ORDER — GLYCOPYRROLATE 0.2 MG/ML IJ SOLN
INTRAMUSCULAR | Status: DC | PRN
  Administered 2014-09-04: .5 mg via INTRAVENOUS

## 2014-09-04 MED ORDER — LACTATED RINGERS IV SOLN
INTRAVENOUS | Status: DC
  Administered 2014-09-04 (×3): via INTRAVENOUS

## 2014-09-04 MED ORDER — NEOSTIGMINE METHYLSULFATE 10 MG/10ML IV SOLN
INTRAVENOUS | Status: DC | PRN
  Administered 2014-09-04: 4 mg via INTRAVENOUS

## 2014-09-04 MED ORDER — LIDOCAINE HCL (CARDIAC) 20 MG/ML IV SOLN
INTRAVENOUS | Status: DC | PRN
  Administered 2014-09-04: 60 mg via INTRAVENOUS

## 2014-09-04 MED ORDER — FENTANYL CITRATE (PF) 250 MCG/5ML IJ SOLN
INTRAMUSCULAR | Status: AC
  Filled 2014-09-04: qty 5

## 2014-09-04 MED ORDER — 0.9 % SODIUM CHLORIDE (POUR BTL) OPTIME
TOPICAL | Status: DC | PRN
  Administered 2014-09-04: 1000 mL

## 2014-09-04 MED ORDER — SODIUM CHLORIDE 0.9 % IJ SOLN: 3.0000 mL | Freq: Two times a day (BID) | INTRAMUSCULAR | Status: DC

## 2014-09-04 SURGICAL SUPPLY — 43 items
ADAPTER BRONCH F/PENTAX (ADAPTER) ×4 IMPLANT
BRUSH CYTOL CELLEBRITY 1.5X140 (MISCELLANEOUS) IMPLANT
BRUSH SUPERTRAX BIOPSY (INSTRUMENTS) IMPLANT
BRUSH SUPERTRAX NDL-TIP CYTO (INSTRUMENTS) ×4 IMPLANT
CANISTER SUCTION 2500CC (MISCELLANEOUS) ×8 IMPLANT
CHANNEL WORK EXTEND EDGE 180 (KITS) IMPLANT
CHANNEL WORK EXTEND EDGE 45 (KITS) IMPLANT
CHANNEL WORK EXTEND EDGE 90 (KITS) ×4 IMPLANT
CONT SPEC 4OZ CLIKSEAL STRL BL (MISCELLANEOUS) ×12 IMPLANT
COTTONBALL LRG STERILE PKG (GAUZE/BANDAGES/DRESSINGS) IMPLANT
COVER TABLE BACK 60X90 (DRAPES) ×8 IMPLANT
FILTER STRAW FLUID ASPIR (MISCELLANEOUS) IMPLANT
FORCEPS BIOP RJ4 1.8 (CUTTING FORCEPS) IMPLANT
FORCEPS BIOP SUPERTRX PREMAR (INSTRUMENTS) ×4 IMPLANT
GAUZE SPONGE 4X4 12PLY STRL (GAUZE/BANDAGES/DRESSINGS) ×4 IMPLANT
GLOVE SURG SIGNA 7.5 PF LTX (GLOVE) ×8 IMPLANT
GOWN STRL REUS W/ TWL XL LVL3 (GOWN DISPOSABLE) ×4 IMPLANT
GOWN STRL REUS W/TWL XL LVL3 (GOWN DISPOSABLE) ×4
KIT CLEAN ENDO COMPLIANCE (KITS) ×8 IMPLANT
KIT PROCEDURE EDGE 180 (KITS) IMPLANT
KIT PROCEDURE EDGE 45 (KITS) IMPLANT
KIT PROCEDURE EDGE 90 (KITS) IMPLANT
KIT ROOM TURNOVER OR (KITS) ×8 IMPLANT
MARKER SKIN DUAL TIP RULER LAB (MISCELLANEOUS) ×8 IMPLANT
NEEDLE 22X1 1/2 (OR ONLY) (NEEDLE) IMPLANT
NEEDLE BIOPSY TRANSBRONCH 21G (NEEDLE) IMPLANT
NEEDLE BLUNT 18X1 FOR OR ONLY (NEEDLE) IMPLANT
NEEDLE SUPERTRX PREMARK BIOPSY (NEEDLE) IMPLANT
NEEDLE SYS SONOTIP II EBUSTBNA (NEEDLE) ×4 IMPLANT
NS IRRIG 1000ML POUR BTL (IV SOLUTION) ×8 IMPLANT
OIL SILICONE PENTAX (PARTS (SERVICE/REPAIRS)) ×4 IMPLANT
PAD ARMBOARD 7.5X6 YLW CONV (MISCELLANEOUS) ×8 IMPLANT
PATCHES PATIENT (LABEL) IMPLANT
SYR 20CC LL (SYRINGE) ×8 IMPLANT
SYR 20ML ECCENTRIC (SYRINGE) ×8 IMPLANT
SYR 30ML LL (SYRINGE) ×4 IMPLANT
SYR 5ML LL (SYRINGE) ×8 IMPLANT
SYR 5ML LUER SLIP (SYRINGE) ×4 IMPLANT
SYR CONTROL 10ML LL (SYRINGE) IMPLANT
TOWEL OR 17X24 6PK STRL BLUE (TOWEL DISPOSABLE) ×8 IMPLANT
TRAP SPECIMEN MUCOUS 40CC (MISCELLANEOUS) ×8 IMPLANT
TUBE CONNECTING 20'X1/4 (TUBING) ×3
TUBE CONNECTING 20X1/4 (TUBING) ×9 IMPLANT

## 2014-09-04 NOTE — Anesthesia Preprocedure Evaluation (Addendum)
Anesthesia Evaluation  Patient identified by MRN, date of birth, ID band Patient awake    Reviewed: Allergy & Precautions, NPO status , Patient's Chart, lab work & pertinent test results  History of Anesthesia Complications Negative for: history of anesthetic complications  Airway Mallampati: II  TM Distance: >3 FB Neck ROM: Full    Dental  (+) Edentulous Upper, Dental Advisory Given,    Pulmonary shortness of breath, COPD COPD inhaler, former smoker,  breath sounds clear to auscultation        Cardiovascular hypertension, Pt. on medications and Pt. on home beta blockers +CHF - Past MI - dysrhythmias Rhythm:Regular     Neuro/Psych    GI/Hepatic Neg liver ROS, GERD-  Medicated and Controlled,  Endo/Other  Morbid obesity  Renal/GU Renal InsufficiencyRenal disease     Musculoskeletal  (+) Arthritis -,   Abdominal   Peds  Hematology  (+) anemia ,   Anesthesia Other Findings Patient is blind.  Reproductive/Obstetrics                            Anesthesia Physical Anesthesia Plan  ASA: III  Anesthesia Plan: General   Post-op Pain Management:    Induction: Intravenous  Airway Management Planned: Oral ETT  Additional Equipment: None  Intra-op Plan:   Post-operative Plan: Extubation in OR  Informed Consent: I have reviewed the patients History and Physical, chart, labs and discussed the procedure including the risks, benefits and alternatives for the proposed anesthesia with the patient or authorized representative who has indicated his/her understanding and acceptance.   Dental advisory given  Plan Discussed with: CRNA and Surgeon  Anesthesia Plan Comments:         Anesthesia Quick Evaluation

## 2014-09-04 NOTE — Anesthesia Procedure Notes (Signed)
Procedure Name: Intubation Date/Time: 09/04/2014 1:06 PM Performed by: Jenne Campus Pre-anesthesia Checklist: Patient identified, Emergency Drugs available, Suction available, Patient being monitored and Timeout performed Patient Re-evaluated:Patient Re-evaluated prior to inductionOxygen Delivery Method: Circle system utilized Preoxygenation: Pre-oxygenation with 100% oxygen Intubation Type: IV induction Ventilation: Mask ventilation without difficulty and Oral airway inserted - appropriate to patient size Laryngoscope Size: Miller and 3 Grade View: Grade I Tube type: Oral Tube size: 8.5 mm Number of attempts: 1 Airway Equipment and Method: Stylet and LTA kit utilized Placement Confirmation: ETT inserted through vocal cords under direct vision,  positive ETCO2,  CO2 detector and breath sounds checked- equal and bilateral Secured at: 22 cm Tube secured with: Tape Dental Injury: Teeth and Oropharynx as per pre-operative assessment

## 2014-09-04 NOTE — Transfer of Care (Signed)
Immediate Anesthesia Transfer of Care Note  Patient: Nicholas [name]  Procedure(s) Performed: Procedure(s): VIDEO BRONCHOSCOPY WITH Biopsy and ENDOBRONCHIAL ULTRASOUND (N/A)  Patient Location: PACU  Anesthesia Type:General  Level of Consciousness: awake, oriented and patient cooperative  Airway & Oxygen Therapy: Patient Spontanous Breathing and Patient connected to nasal cannula oxygen  Post-op Assessment: Report given to RN and Post -op Vital signs reviewed and stable  Post vital signs: Reviewed  Last Vitals:  Filed Vitals:   09/04/14 1217  BP: 172/65  Pulse:   Temp:   Resp:     Complications: No apparent anesthesia complications

## 2014-09-04 NOTE — Interval H&P Note (Signed)
History and Physical Interval Note: Disc of CT is improperly formatted and can not be used for ENB. Will proceed with bronch and EBUS 09/04/2014 12:45 PM  Nicholas [name]  has presented today for surgery, with the diagnosis of RIGHT LUNG MASS  The various methods of treatment have been discussed with the patient and family. After consideration of risks, benefits and other options for treatment, the patient has consented to  Procedure(s): VIDEO BRONCHOSCOPY WITH ENDOBRONCHIAL NAVIGATION (N/A) VIDEO BRONCHOSCOPY WITH ENDOBRONCHIAL ULTRASOUND (N/A) as a surgical intervention .  The patient's history has been reviewed, patient examined, no change in status, stable for surgery.  I have reviewed the patient's chart and labs.  Questions were answered to the patient's satisfaction.     Melrose Nakayama

## 2014-09-04 NOTE — Brief Op Note (Signed)
09/04/2014  3:05 PM  PATIENT:  Nicholas [name]  80 y.o. male  PRE-OPERATIVE DIAGNOSIS:  RIGHT UPPER LOBE MASS, MEDIASTINAL ADENOPATHY  POST-OPERATIVE DIAGNOSIS:  RIGHT UPPER LOBE MASS, MEDIASTINAL ADENOPATHY  PROCEDURE:  Procedure(s): VIDEO BRONCHOSCOPY WITH Biopsy and ENDOBRONCHIAL ULTRASOUND (N/A) Needle aspirations of mediastinal lymph nodes. Brushings, biopsies and washings of Right upper lobe mass SURGEON:  Surgeon(s) and Role:    * Melrose Nakayama, MD - Primary   ANESTHESIA:   general  EBL:  Total I/O In: 1000 [I.V.:1000] Out: -   BLOOD ADMINISTERED:none  DRAINS: none   LOCAL MEDICATIONS USED:  NONE  SPECIMEN:  Source of Specimen:  Level 4R and 7 LN, RUL mass  DISPOSITION OF SPECIMEN:  PATHOLOGY  COUNTS:  NO no indicated  PLAN OF CARE: Discharge to home after PACU  PATIENT DISPOSITION:  PACU - hemodynamically stable.   Delay start of Pharmacological VTE agent (>24hrs) due to surgical blood loss or risk of bleeding: not applicable  LN aspirations negative. Atypical cells on initial brushings from RUL
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
## 2014-09-04 NOTE — Discharge Instructions (Addendum)
Do not engage in heavy physical activity for 24 hours  You should have someone with you for the next 24 hours  You will likely cough up small amounts of blood over the next few days  My office will contact you with follow up information  Call [phone] if you develop chest pain, shortness of breath, fever > 101, or cough up large amounts of blood.  General Anesthesia, Adult, Care After  Refer to this sheet in the next few weeks. These instructions provide you with information on caring for yourself after your procedure. Your health care provider may also give you more specific instructions. Your treatment has been planned according to current medical practices, but problems sometimes occur. Call your health care provider if you have any problems or questions after your procedure.  WHAT TO EXPECT AFTER THE PROCEDURE  After the procedure, it is typical to experience:  Sleepiness.  Nausea and vomiting. HOME CARE INSTRUCTIONS  For the first 24 hours after general anesthesia:  Have a responsible person with you.  Do not drive a car. If you are alone, do not take public transportation.  Do not drink alcohol.  Do not take medicine that has not been prescribed by your health care provider.  Do not sign important papers or make important decisions.  You may resume a normal diet and activities as directed by your health care provider.  Change bandages (dressings) as directed.  If you have questions or problems that seem related to general anesthesia, call the hospital and ask for the anesthetist or anesthesiologist on call. SEEK MEDICAL CARE IF:  You have nausea and vomiting that continue the day after anesthesia.  You develop a rash. SEEK IMMEDIATE MEDICAL CARE IF:  You have difficulty breathing.  You have chest pain.  You have any allergic problems. Document Released: 07/31/2000 Document Revised: 12/25/2012 Document Reviewed: 11/07/2012  [facility] Patient Information 2014 [place], [place].    Sore Throat    A sore throat is a painful, burning, sore, or scratchy feeling of the throat. There may be pain or tenderness when swallowing or talking. You may have other symptoms with a sore throat. These include coughing, sneezing, fever, or a swollen neck. A sore throat is often the first sign of another sickness. These sicknesses may include a cold, flu, strep throat, or an infection called mono. Most sore throats go away without medical treatment.  HOME CARE  Only take medicine as told by your doctor.  Drink enough fluids to keep your pee (urine) clear or pale yellow.  Rest as needed.  Try using throat sprays, lozenges, or suck on hard candy (if older than 4 years or as told).  Sip warm liquids, such as broth, herbal tea, or warm water with honey. Try sucking on frozen ice pops or drinking cold liquids.  Rinse the mouth (gargle) with salt water. Mix 1 teaspoon salt with 8 ounces of water.  Do not smoke. Avoid being around others when they are smoking.  Put a humidifier in your bedroom at night to moisten the air. You can also turn on a hot shower and sit in the bathroom for 5-10 minutes. Be sure the bathroom door is closed. GET HELP RIGHT AWAY IF:  You have trouble breathing.  You cannot swallow fluids, soft foods, or your spit (saliva).  You have more puffiness (swelling) in the throat.  Your sore throat does not get better in 7 days.  You feel sick to your stomach (nauseous) and throw up (  vomit).  You have a fever or lasting symptoms for more than 2-3 days.  You have a fever and your symptoms suddenly get worse. MAKE SURE YOU:  Understand these instructions.  Will watch your condition.  Will get help right away if you are not doing well or get worse. Document Released: 02/01/2008 Document Revised: 01/17/2012 Document Reviewed: 12/31/2011  [facility] Patient Information 2015 [place], [place]. This information is not intended to replace advice given to you by your health care  provider. Make sure you discuss any questions you have with your health care provider.

## 2014-09-04 NOTE — Anesthesia Postprocedure Evaluation (Signed)
  Anesthesia Post-op Note  Patient: Nicholas [name]  Procedure(s) Performed: Procedure(s): VIDEO BRONCHOSCOPY WITH Biopsy and ENDOBRONCHIAL ULTRASOUND (N/A)  Patient Location: PACU  Anesthesia Type:General  Level of Consciousness: awake  Airway and Oxygen Therapy: Patient Spontanous Breathing  Post-op Pain: mild  Post-op Assessment: Post-op Vital signs reviewed  Post-op Vital Signs: Reviewed  Last Vitals:  Filed Vitals:   09/04/14 1535  BP:   Pulse: 79  Temp: 36.8 C  Resp: 19    Complications: No apparent anesthesia complications

## 2014-09-05 NOTE — Op Note (Signed)
NAME:  Nicholas [name], Nicholas [name] NO.:  [id number]  MEDICAL RECORD NO.:  [id number]  LOCATION:  [facility]                         FACILITY:  [facility]  PHYSICIAN:  Revonda Standard. Roxan Hockey, M.D.DATE OF BIRTH:  [birth year]  DATE OF PROCEDURE:  09/04/2014 DATE OF DISCHARGE:  09/04/2014                              OPERATIVE REPORT   PREOPERATIVE DIAGNOSIS:  Right upper lobe mass with mediastinal adenopathy.  POSTOPERATIVE DIAGNOSIS:  Right upper lobe mass with mediastinal adenopathy.  PROCEDURES:  Video bronchoscopy with brushings, biopsies, and washings, Endobronchial ultrasound with aspiration of mediastinal lymph nodes.  SURGEON:  Revonda Standard. Roxan Hockey, M.D.  ASSISTANT:  None.  ANESTHESIA:  General.  FINDINGS:  Quick prep on node aspirations showed no tumor, quick prep on brushings from right upper lobe showed atypical cells.  CLINICAL NOTE:  Nicholas [name] is a 80 year old gentleman who recently was found to have a right upper lobe mass.  This was confirmed by CT scan which showed a spiculated mass with hilar and mediastinal adenopathy. He was advised to undergo electromagnetic navigational bronchoscopy and endobronchial ultrasound for diagnostic and staging purposes.  The indications, risks, benefits, and alternatives were discussed in detail with the patient.  He understood and accepted the risks and agreed to proceed.  When attempting to do the preoperative planning for the navigational bronchoscopy, the CT was not formatted correctly and the mapping could not be performed.  Given the size of the mass, it was felt that there was a reasonable chance of obtaining a diagnosis without navigation, therefore the decision was made to proceed with bronchoscopy and endobronchial ultrasound.  This was discussed with the patient before and he agreed to proceed.  DESCRIPTION OF PROCEDURE:  Nicholas [name] was brought to the operating room on September 04, 2014.  He had induction of general  anesthesia and was intubated.  Flexible fiberoptic bronchoscopy was performed via the endotracheal tube.  There were abundant clear secretions.  No endobronchial mass lesions were seen.  There was normal endobronchial anatomy.  The endobronchial ultrasound probe then was advanced.  There were enlarged nodes in the 4R and level 7 areas.  Multiple aspirations were performed of each node.  With each aspiration, the needle was advanced into the node.  Suction was applied and 10 to 12 passes were made with the needle.  A portion of the specimen was placed on slides for quick prep and the remainder was placed into cytolyte. After performing multiple aspirations of each node, slides were sent for quick prep.  The remainder will be sent for cell block and permanent pathology.  The bronchoscope was reinserted.  The apical segmental bronchus of the right upper lobe was identified.  A 90-degree sheath was advanced into the subsegmental bronchus.  It was felt this airway was the most likely to be leading to the tumor.  Brushings and biopsies were performed.  Multiple passes were made with each device.  All sampling was done with fluoroscopic guidance.  Next, the more lateral bronchus was cannulated with 90-degree sheath and additional sampling was performed, once again performing both brushings and biopsies.  The brushings were sent for immediate evaluation.  By this point, the quick prep of the aspirations  of the mediastinal nodes shown level 7 specimen was inadequate, the level 4R node showed lymphoid cells, but no definite tumor.  Additional biopsies and brushings were taken from the right upper lobe and then washings were taken from the right upper lobe as well.  The remainder of the specimens were sent for permanent pathology.  The endobronchial ultrasound probe was readvanced and additional aspirations of the level 7 subcarinal nodes were performed.  These were all sent for permanent  evaluation.  A final inspection was made with the bronchoscope.  There was no ongoing bleeding.  The bronchoscope was withdrawn.  The patient was extubated in the operating room and taken to the postanesthetic care unit in good condition.     Revonda Standard Roxan Hockey, M.D.     SCH/MEDQ  D:  09/04/2014  T:  09/05/2014  Job:  [id number]

## 2014-09-07 ENCOUNTER — Encounter (HOSPITAL_COMMUNITY): Payer: Self-pay | Admitting: Thoracic Surgery (Cardiothoracic Vascular Surgery)

## 2014-09-09 LAB — CULTURE, RESPIRATORY W GRAM STAIN

## 2014-09-09 LAB — CULTURE, RESPIRATORY

## 2014-09-10 ENCOUNTER — Ambulatory Visit (INDEPENDENT_AMBULATORY_CARE_PROVIDER_SITE_OTHER): Payer: Medicare HMO | Admitting: Thoracic Surgery (Cardiothoracic Vascular Surgery)

## 2014-09-10 ENCOUNTER — Other Ambulatory Visit: Payer: Self-pay

## 2014-09-10 ENCOUNTER — Encounter: Payer: Self-pay | Admitting: Thoracic Surgery (Cardiothoracic Vascular Surgery)

## 2014-09-10 VITALS — BP 204/86 | HR 69 | Resp 20 | Ht 71.0 in | Wt 250.0 lb

## 2014-09-10 DIAGNOSIS — R918 Other nonspecific abnormal finding of lung field: Secondary | ICD-10-CM

## 2014-09-10 DIAGNOSIS — R590 Localized enlarged lymph nodes: Secondary | ICD-10-CM

## 2014-09-10 DIAGNOSIS — Z9889 Other specified postprocedural states: Secondary | ICD-10-CM

## 2014-09-10 DIAGNOSIS — R599 Enlarged lymph nodes, unspecified: Secondary | ICD-10-CM
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
## 2014-09-17 ENCOUNTER — Other Ambulatory Visit: Payer: Self-pay | Admitting: Internal Medicine

## 2014-09-17 ENCOUNTER — Ambulatory Visit (HOSPITAL_COMMUNITY): Payer: Medicare HMO

## 2014-09-17 ENCOUNTER — Telehealth: Payer: Self-pay | Admitting: *Deleted

## 2014-09-17 ENCOUNTER — Other Ambulatory Visit: Payer: Self-pay | Admitting: *Deleted

## 2014-09-17 DIAGNOSIS — R918 Other nonspecific abnormal finding of lung field: Secondary | ICD-10-CM

## 2014-09-17 NOTE — Telephone Encounter (Signed)
Received referral from Dr. Roxan Hockey.  I called to set up with appt for thoracic clinic, I spoke with family who brings him to all appt.  Patient will be seen 09/24/14 arrive at 3.

## 2014-09-18 ENCOUNTER — Ambulatory Visit (HOSPITAL_COMMUNITY): Payer: Medicare HMO

## 2014-09-24 ENCOUNTER — Ambulatory Visit: Payer: Medicare HMO | Admitting: Internal Medicine

## 2014-09-24 ENCOUNTER — Other Ambulatory Visit: Payer: Medicare HMO

## 2014-09-28 ENCOUNTER — Ambulatory Visit (HOSPITAL_COMMUNITY)
Admission: RE | Admit: 2014-09-28 | Discharge: 2014-09-28 | Disposition: A | Discharge status: Home / Self Care | Payer: Medicare HMO | Source: Ambulatory Visit | Attending: Thoracic Surgery (Cardiothoracic Vascular Surgery) | Admitting: Thoracic Surgery (Cardiothoracic Vascular Surgery)

## 2014-09-28 ENCOUNTER — Other Ambulatory Visit: Payer: Self-pay | Admitting: Internal Medicine

## 2014-09-28 DIAGNOSIS — R918 Other nonspecific abnormal finding of lung field: Secondary | ICD-10-CM | POA: Diagnosis present

## 2014-09-28 LAB — GLUCOSE, CAPILLARY: GLUCOSE-CAPILLARY: 104 mg/dL — AB (ref 65–99)

## 2014-09-28 MED ORDER — FLUDEOXYGLUCOSE F - 18 (FDG) INJECTION
12.4000 | Freq: Once | INTRAVENOUS | Status: AC | PRN
  Administered 2014-09-28: 12.4 via INTRAVENOUS

## 2014-10-01 ENCOUNTER — Other Ambulatory Visit: Payer: Self-pay | Admitting: Radiology

## 2014-10-02 ENCOUNTER — Other Ambulatory Visit: Payer: Self-pay | Admitting: Radiology

## 2014-10-02 LAB — FUNGUS CULTURE W SMEAR: Fungal Smear: NONE SEEN

## 2014-10-06 ENCOUNTER — Ambulatory Visit (HOSPITAL_COMMUNITY)
Admission: RE | Admit: 2014-10-06 | Discharge: 2014-10-06 | Disposition: A | Discharge status: Home / Self Care | Payer: Medicare HMO | Source: Ambulatory Visit | Attending: Thoracic Surgery (Cardiothoracic Vascular Surgery) | Admitting: Thoracic Surgery (Cardiothoracic Vascular Surgery)

## 2014-10-06 ENCOUNTER — Encounter (HOSPITAL_COMMUNITY): Payer: Self-pay

## 2014-10-06 DIAGNOSIS — I10 Essential (primary) hypertension: Secondary | ICD-10-CM | POA: Diagnosis not present

## 2014-10-06 DIAGNOSIS — R590 Localized enlarged lymph nodes: Secondary | ICD-10-CM | POA: Insufficient documentation

## 2014-10-06 DIAGNOSIS — Z79899 Other long term (current) drug therapy: Secondary | ICD-10-CM | POA: Diagnosis not present

## 2014-10-06 DIAGNOSIS — E785 Hyperlipidemia, unspecified: Secondary | ICD-10-CM | POA: Insufficient documentation

## 2014-10-06 DIAGNOSIS — R918 Other nonspecific abnormal finding of lung field: Secondary | ICD-10-CM

## 2014-10-06 DIAGNOSIS — H540 Blindness, both eyes: Secondary | ICD-10-CM | POA: Diagnosis not present

## 2014-10-06 DIAGNOSIS — Z9889 Other specified postprocedural states: Secondary | ICD-10-CM

## 2014-10-06 DIAGNOSIS — K219 Gastro-esophageal reflux disease without esophagitis: Secondary | ICD-10-CM | POA: Diagnosis not present

## 2014-10-06 DIAGNOSIS — Z87891 Personal history of nicotine dependence: Secondary | ICD-10-CM | POA: Diagnosis not present

## 2014-10-06 DIAGNOSIS — C3411 Malignant neoplasm of upper lobe, right bronchus or lung: Secondary | ICD-10-CM | POA: Insufficient documentation

## 2014-10-06 LAB — APTT: aPTT: 39 seconds — ABNORMAL HIGH (ref 24–37)

## 2014-10-06 LAB — CBC
HCT: 31.7 % — ABNORMAL LOW (ref 39.0–52.0)
Hemoglobin: 9.6 g/dL — ABNORMAL LOW (ref 13.0–17.0)
MCH: 24.3 pg — AB (ref 26.0–34.0)
MCHC: 30.3 g/dL (ref 30.0–36.0)
MCV: 80.3 fL (ref 78.0–100.0)
PLATELETS: 321 10*3/uL (ref 150–400)
RBC: 3.95 MIL/uL — ABNORMAL LOW (ref 4.22–5.81)
RDW: 16.7 % — ABNORMAL HIGH (ref 11.5–15.5)
WBC: 9.1 10*3/uL (ref 4.0–10.5)

## 2014-10-06 LAB — PROTIME-INR
INR: 1.28 (ref 0.00–1.49)
Prothrombin Time: 16.1 seconds — ABNORMAL HIGH (ref 11.6–15.2)

## 2014-10-06 MED ORDER — FENTANYL CITRATE (PF) 100 MCG/2ML IJ SOLN
INTRAMUSCULAR | Status: AC
  Filled 2014-10-06: qty 2

## 2014-10-06 MED ORDER — HYDROCODONE-ACETAMINOPHEN 5-325 MG PO TABS
ORAL_TABLET | ORAL | Status: AC
  Administered 2014-10-06: 1 via ORAL
  Filled 2014-10-06: qty 1

## 2014-10-06 MED ORDER — MIDAZOLAM HCL 2 MG/2ML IJ SOLN
INTRAMUSCULAR | Status: AC
  Filled 2014-10-06: qty 2

## 2014-10-06 MED ORDER — FENTANYL CITRATE (PF) 100 MCG/2ML IJ SOLN
INTRAMUSCULAR | Status: AC | PRN
  Administered 2014-10-06: 50 ug via INTRAVENOUS

## 2014-10-06 MED ORDER — MIDAZOLAM HCL 2 MG/2ML IJ SOLN
INTRAMUSCULAR | Status: AC | PRN
  Administered 2014-10-06: 1 mg via INTRAVENOUS

## 2014-10-06 MED ORDER — SODIUM CHLORIDE 0.9 % IV SOLN: Freq: Once | INTRAVENOUS | Status: DC

## 2014-10-06 MED ORDER — LIDOCAINE HCL 1 % IJ SOLN
INTRAMUSCULAR | Status: AC
  Filled 2014-10-06: qty 20

## 2014-10-06 MED ORDER — HYDROCODONE-ACETAMINOPHEN 5-325 MG PO TABS
1.0000 | ORAL_TABLET | ORAL | Status: AC
  Administered 2014-10-06: 1 via ORAL
  Filled 2014-10-06: qty 1

## 2014-10-06 NOTE — Sedation Documentation (Signed)
Patient is resting comfortably. 

## 2014-10-06 NOTE — Procedures (Signed)
CT guided core biopsies of right upper lobe lesion.  2 core biopsies obtained.  No immediate complication.  Minimal blood loss.  See report in PACS.

## 2014-10-06 NOTE — Discharge Instructions (Signed)
Needle Biopsy of Lung, Care After °Refer to this sheet in the next few weeks. These instructions provide you with information on caring for yourself after your procedure. Your health care provider may also give you more specific instructions. Your treatment has been planned according to current medical practices, but problems sometimes occur. Call your health care provider if you have any problems or questions after your procedure. °WHAT TO EXPECT AFTER THE PROCEDURE °· A bandage will be applied over the area where the needle was inserted. You may be asked to apply pressure to the bandage for several minutes to ensure there is minimal bleeding. °· In most cases, you can leave when your needle biopsy procedure is completed. Do not drive yourself home. Someone else should take you home. °· If you received an IV sedative or general anesthetic, you will be taken to a comfortable place to relax while the medicine wears off. °· If you have upcoming travel scheduled, talk to your health care provider about when it is safe to travel by air after the procedure. °HOME CARE INSTRUCTIONS °· Expect to take it easy for the rest of the day. °· Protect the area where you received the needle biopsy by keeping the bandage in place for as long as instructed. °· You may feel some mild pain or discomfort in the area, but this should stop in a day or two. °· Take medicines only as directed by your health care provider. °SEEK MEDICAL CARE IF:  °· You have pain at the biopsy site that worsens or is not helped by medicine. °· You have swelling or drainage at the needle biopsy site. °· You have a fever. °SEEK IMMEDIATE MEDICAL CARE IF:  °· You have new or worsening shortness of breath. °· You have chest pain. °· You are coughing up blood. °· You have bleeding that does not stop with pressure or a bandage. °· You develop light-headedness or fainting. °Document Released: 02/19/2007 Document Revised: 09/08/2013 Document Reviewed:  09/16/2012 °ExitCare® Patient Information ©2015 [employer]. This information is not intended to replace advice given to you by your health care provider. Make sure you discuss any questions you have with your health care provider. ° °
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
## 2014-10-07 ENCOUNTER — Ambulatory Visit (INDEPENDENT_AMBULATORY_CARE_PROVIDER_SITE_OTHER): Payer: Medicare Other | Admitting: Internal Medicine

## 2014-10-07 ENCOUNTER — Encounter: Payer: Self-pay | Admitting: Internal Medicine

## 2014-10-07 VITALS — BP 158/63 | HR 66 | Temp 98.4°F | Ht 71.0 in | Wt 244.6 lb

## 2014-10-07 DIAGNOSIS — M1611 Unilateral primary osteoarthritis, right hip: Secondary | ICD-10-CM

## 2014-10-07 DIAGNOSIS — I10 Essential (primary) hypertension: Secondary | ICD-10-CM | POA: Diagnosis not present

## 2014-10-07 DIAGNOSIS — R918 Other nonspecific abnormal finding of lung field: Secondary | ICD-10-CM

## 2014-10-07 DIAGNOSIS — M159 Polyosteoarthritis, unspecified: Secondary | ICD-10-CM

## 2014-10-07 DIAGNOSIS — M150 Primary generalized (osteo)arthritis: Principal | ICD-10-CM

## 2014-10-07 MED ORDER — HYDROCODONE-ACETAMINOPHEN 10-325 MG PO TABS: 1.0000 | ORAL_TABLET | Freq: Four times a day (QID) | ORAL | Status: DC | PRN

## 2014-10-07 NOTE — Patient Instructions (Signed)
It was a pleasure to see you today. We are giving you one month of your pain medicine as you will come back in a month to check in with [place] about your lung issue.Please return to clinic or seek medical attention if you have any new or worsening trouble breathing, cough, or other worrisome medical condition. We look forward to seeing you again in one month.  [name], MD  General Instructions:   Please try to bring all your medicines next time. This will help [place] keep you safe from mistakes.   Progress Toward Treatment Goals:  Treatment Goal 10/07/2014  Blood pressure unchanged    Self Care Goals & Plans:  Self Care Goal 07/08/2014  Manage my medications take my medicines as prescribed; bring my medications to every visit; refill my medications on time  Monitor my health keep track of my blood pressure  Eat healthy foods (No Data)  Be physically active find an activity I enjoy  Meeting treatment goals -    No flowsheet data found.   Care Management & Community Referrals:  Referral 09/09/2013  Referrals made for care management support none needed  Referrals made to community resources -

## 2014-10-07 NOTE — Assessment & Plan Note (Signed)
BP Readings from Last 3 Encounters:  10/07/14 158/63  09/10/14 204/86  09/04/14 175/83    Lab Results  Component Value Date   NA 139 09/02/2014   K 3.8 09/02/2014   CREATININE 1.27 09/02/2014    Assessment: Blood pressure control: mildly elevated Progress toward BP goal:  unchanged Comments: Slightly high today at 158/63 while on HCTZ 12.5 mg daily, enalapril 40 mg daily, coreg 25 mg bid. We discussed in the past when it was high going up on HCTZ to 25 mg daily. However, his BP was actually soft yesterday during his procedure so will hold on changes for now and continue to reassess.  Plan: Medications:  continue current medications Educational resources provided:   Self management tools provided:   Other plans: RTC 1 month and consider increasing HCTZ if needed

## 2014-10-07 NOTE — Assessment & Plan Note (Addendum)
Nicholas [name] went to ED 4/19 for L shoulder pain and had a CXR with a R lung mass. His CT demonstrated R upper lobe 3.5 x 2.7 cm spiculated mass with hilar and mediastinal adenopathy. He underwent ENB/EBUS 4/29 by Dr Modesto Charon of [facility] with pathology returning nondiagnostic and recommended Ct guided needle biopsy. PET/CT on 5/23 demonstrated R apical primary bronchogenic carcinoma with equivocal R mediastinal lymph node and other findings favored to be physiologic.  Yesterday he had CT biopsy of R upper lobe lesion and 2 core biopsies obtained which was tolerated well. Pathology results pending  Today, Nicholas [name] is in good spirits. We discussed the time line of his presentation and I tried to answer questions he and his daughter had without knowing the exact diagnosis. He is to be contacted by [facility] when results return. His daughter would like him to return soon rather than in 3 months so that [facility] keeps a close eye on him. I told them we would do whatever we could to support them.

## 2014-10-07 NOTE — Assessment & Plan Note (Signed)
Nicholas [name] complains of unchanged chronic joint pain such as his R hip. He has pain contract for norco 10-325 q6hprn #120/month with no red flags -refill norco 10-325 #120, 0 refills for one month as he is to RTC in 1 month -patient may suspend pain contract if needed to get additional pain medicines for possible cancer work-up and treatment
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
## 2014-10-09 ENCOUNTER — Other Ambulatory Visit: Payer: Self-pay | Admitting: Internal Medicine

## 2014-10-09 NOTE — Progress Notes (Signed)
Internal Medicine Clinic Attending  Case discussed with Dr. Rothman at the time of the visit.  We reviewed the resident's history and exam and pertinent patient test results.  I agree with the assessment, diagnosis, and plan of care documented in the resident's note. 

## 2014-10-12 ENCOUNTER — Other Ambulatory Visit: Payer: Self-pay | Admitting: *Deleted

## 2014-10-12 ENCOUNTER — Telehealth: Payer: Self-pay | Admitting: *Deleted

## 2014-10-12 DIAGNOSIS — R918 Other nonspecific abnormal finding of lung field: Secondary | ICD-10-CM

## 2014-10-12 NOTE — Telephone Encounter (Signed)
Oncology Nurse Navigator Documentation  Oncology Nurse Navigator Flowsheets 10/12/2014  Navigator Encounter Type Other/Phone call   Treatment Phase Other/pre-treatment  Coordination of Care MD Appointments/Called and set up for thoracic clinic on 10/22/14 arrive at 12:30.  He verbalized understanding of appt time and place.   Time Spent with Patient 15

## 2014-10-18 LAB — AFB CULTURE WITH SMEAR (NOT AT ARMC): ACID FAST SMEAR: NONE SEEN

## 2014-10-22 ENCOUNTER — Ambulatory Visit (HOSPITAL_BASED_OUTPATIENT_CLINIC_OR_DEPARTMENT_OTHER): Payer: Medicare Other | Admitting: Internal Medicine

## 2014-10-22 ENCOUNTER — Telehealth: Payer: Self-pay | Admitting: *Deleted

## 2014-10-22 ENCOUNTER — Encounter: Payer: Self-pay | Admitting: Radiation Oncology

## 2014-10-22 ENCOUNTER — Ambulatory Visit
Admission: RE | Admit: 2014-10-22 | Discharge: 2014-10-22 | Disposition: A | Discharge status: Home / Self Care | Payer: Medicaid Other | Source: Ambulatory Visit | Attending: Radiation Oncology | Admitting: Radiation Oncology

## 2014-10-22 ENCOUNTER — Telehealth: Payer: Self-pay | Admitting: Internal Medicine

## 2014-10-22 ENCOUNTER — Encounter: Payer: Self-pay | Admitting: *Deleted

## 2014-10-22 ENCOUNTER — Encounter: Payer: Self-pay | Admitting: Internal Medicine

## 2014-10-22 ENCOUNTER — Other Ambulatory Visit (HOSPITAL_BASED_OUTPATIENT_CLINIC_OR_DEPARTMENT_OTHER): Payer: Medicare Other

## 2014-10-22 VITALS — BP 134/45 | HR 66 | Temp 98.2°F | Resp 20 | Ht 71.0 in | Wt 242.8 lb

## 2014-10-22 VITALS — BP 134/45 | HR 66 | Temp 98.2°F | Resp 20 | Wt 242.8 lb

## 2014-10-22 DIAGNOSIS — C3492 Malignant neoplasm of unspecified part of left bronchus or lung: Secondary | ICD-10-CM

## 2014-10-22 DIAGNOSIS — C3411 Malignant neoplasm of upper lobe, right bronchus or lung: Secondary | ICD-10-CM | POA: Insufficient documentation

## 2014-10-22 DIAGNOSIS — R918 Other nonspecific abnormal finding of lung field: Secondary | ICD-10-CM

## 2014-10-22 LAB — CBC WITH DIFFERENTIAL/PLATELET
BASO%: 0.5 % (ref 0.0–2.0)
BASOS ABS: 0 10*3/uL (ref 0.0–0.1)
EOS%: 5.2 % (ref 0.0–7.0)
Eosinophils Absolute: 0.3 10*3/uL (ref 0.0–0.5)
HEMATOCRIT: 30.8 % — AB (ref 38.4–49.9)
HEMOGLOBIN: 9.4 g/dL — AB (ref 13.0–17.1)
LYMPH#: 1.5 10*3/uL (ref 0.9–3.3)
LYMPH%: 25.6 % (ref 14.0–49.0)
MCH: 24.4 pg — AB (ref 27.2–33.4)
MCHC: 30.5 g/dL — AB (ref 32.0–36.0)
MCV: 80 fL (ref 79.3–98.0)
MONO#: 0.8 10*3/uL (ref 0.1–0.9)
MONO%: 13.6 % (ref 0.0–14.0)
NEUT#: 3.3 10*3/uL (ref 1.5–6.5)
NEUT%: 55.1 % (ref 39.0–75.0)
Platelets: 257 10*3/uL (ref 140–400)
RBC: 3.85 10*6/uL — ABNORMAL LOW (ref 4.20–5.82)
RDW: 16.8 % — ABNORMAL HIGH (ref 11.0–14.6)
WBC: 6 10*3/uL (ref 4.0–10.3)

## 2014-10-22 LAB — COMPREHENSIVE METABOLIC PANEL (CC13)
ALK PHOS: 77 U/L (ref 40–150)
ALT: 10 U/L (ref 0–55)
AST: 9 U/L (ref 5–34)
Albumin: 2.6 g/dL — ABNORMAL LOW (ref 3.5–5.0)
Anion Gap: 6 mEq/L (ref 3–11)
BILIRUBIN TOTAL: 0.44 mg/dL (ref 0.20–1.20)
BUN: 22 mg/dL (ref 7.0–26.0)
CO2: 30 mEq/L — ABNORMAL HIGH (ref 22–29)
Calcium: 8.5 mg/dL (ref 8.4–10.4)
Chloride: 108 mEq/L (ref 98–109)
Creatinine: 1.5 mg/dL — ABNORMAL HIGH (ref 0.7–1.3)
EGFR: 52 mL/min/{1.73_m2} — AB (ref >90)
GLUCOSE: 113 mg/dL (ref 70–140)
Potassium: 4.1 mEq/L (ref 3.5–5.1)
Sodium: 144 mEq/L (ref 136–145)
Total Protein: 7.1 g/dL (ref 6.4–8.3)
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
## 2014-10-22 NOTE — Progress Notes (Signed)
Oncology Nurse Navigator Documentation  Oncology Nurse Navigator Flowsheets 10/22/2014  Navigator Encounter Type Clinic/MDC  Patient Visit Type Initial  Treatment Phase Other/pre-tx  Barriers/Navigation Needs Education/Information explained about lung cancer and treatment.  I have his nephew written information due to patient being blind.    Education Actor Options  Education Method Verbal and Occupational psychologist  Support Groups/Services Friends and Family  Time Spent with Patient 30         Thoracic Treatment Summary Name:Nicholas [name] Date:10/22/2014 DOB:Oct 24, 1934 Your Medical Team Medical Oncologist:Dr. Mohamed Radiation Oncologist:Dr. Tammi Klippel Pulmonologist:  Surgeon:Dr. Roxan Hockey Type and Stage of Lung Cancer Non-Small Cell Carcinoma: Adenocarcinoma  Clinical Stage:  Non-small cell carcinoma of left lung, stage 3   Staging form: Lung, AJCC 7th Edition     Clinical stage from 11/02/2014: Stage IIIA (T2a, N2, M0) - Signed by Curt Bears, MD on 10/22/2014    Clinical stage is based on radiology exams.  Pathological stage will be determined after surgery.  Staging is based on the size of the tumor, involvement of lymph nodes or not, and whether or not the cancer center has spread. Recommendations Recommendations: Concurrent chemo radiation therapy  These recommendations are based on information available as of today's consult.  This is subject to change depending further testing or exams. Next Steps Next Step: Medical Oncology will set up follow up appointments  Radiation Oncology will set up follow up appointments  Barriers to Care What do you perceive as a potential barrier that may prevent you from receiving your treatment plan? Education-Information given and explained on lung cancer Financial-FA will follow up with patient    Resources Given: NCI Booklet on [employer] at [employer].Radonna Ricker  [phone] Fall Risk Information    Questions Norton Blizzard, RN BSN Thoracic Oncology Nurse Navigator at [place] is a nurse navigator that is available to assist you through your cancer journey.  She can answer your questions and/or provide resources regarding your treatment plan, emotional support, or financial concerns.

## 2014-10-22 NOTE — Telephone Encounter (Signed)
Pt confirmed labs/ov per 06/16 POF, gave pt AVS and Calender......Cherylann Banas, sent msg to add chemo NP

## 2014-10-22 NOTE — Progress Notes (Signed)
  Radiation Oncology         [phone] ________________________________  Name: Nicholas [name] MRN: [id number]  Date: 10/22/2014  DOB: 07-Aug-1934  Chart Note:  We reviewed this patient's most recent findings in Multidisciplinary Thoracic Oncology Conference [facility]).  He has been diagnosed with a 3.8 cm right apical lung adenocarcinoma.  Paratracheal node appeared active on PET, but EBUS was negative.  He was felt to have stage IB disease, and is not a surgical candidate.  We discussed possible SBRT as an option, and will look forward to seeing him for formal rad-onc consultation.  ________________________________  Sheral Apley Tammi Klippel, M.D.

## 2014-10-22 NOTE — Telephone Encounter (Signed)
Per staff message and POF I have scheduled appts. Advised scheduler of appts. JMW  

## 2014-10-23 ENCOUNTER — Ambulatory Visit: Admission: RE | Admit: 2014-10-23 | Payer: Medicaid Other | Source: Ambulatory Visit | Admitting: Radiation Oncology

## 2014-10-23 NOTE — Progress Notes (Signed)
   Radiation Oncology         [phone] ________________________________  Name: Nicholas [name] MRN: [id number]  Date: 10/27/2014  DOB: [birth year]  SIMULATION AND TREATMENT PLANNING NOTE  DIAGNOSIS:  80 y.o gentleman with right upper lung adenocarcinoma - Stage T2a N2 M0 - Stage IIIA    ICD-9-CM ICD-10-CM   1. Cancer of upper lobe of right lung 162.3 C34.11    NARRATIVE:  The patient was brought to the [facility].  Identity was confirmed.  All relevant records and images related to the planned course of therapy were reviewed.  The patient freely provided informed written consent to  proceed with treatment after reviewing the details related to the planned course of therapy. The consent form was witnessed  and verified by the simulation staff.  Then, the patient was set-up in a stable reproducible  supine position for radiation  therapy.  CT images were obtained.  Surface markings were placed.  The CT images were loaded into the planning software.  Then the target and avoidance structures were contoured.  Treatment planning then occurred.  The radiation prescription was  entered and confirmed.  Then, I designed and supervised the construction of a total of 2-5 medically necessary complex  treatment devices, in the form of MLC apertures to shape radiation around the target and shield critical structures  including the heart and spinal cord.  I have requested : 3D Simulation  I have requested a DVH of the following structures:  heart, left lung, right lung, spinal cord, esophagus, and target.  I have ordered:Nutrition Consult and CBC  SPECIAL TREATMENT PROCEDURE:  The planned course of therapy using radiation constitutes a special treatment procedure. Special care is required in the management of this patient for the following reasons. This treatment constitutes a Special Treatment Procedure for the following reason: [ Concurrent chemotherapy requiring careful monitoring for  increased toxicities of treatment including weekly laboratory values..  The special nature of the planned course of radiotherapy will  require increased physician supervision and oversight to ensure patient's safety with optimal treatment outcomes.  PLAN:  The patient will receive 66 Gy in 33 fraction.  This document serves as a record of services personally performed by Tyler Pita, MD. It was created on his behalf by  Darcus Austin, a trained medical scribe. The creation of this record is based on the scribe's personal observations and the  provider's statements to them. This document has been checked and approved by the attending provider.     ------------------------------------------------  Nicholas [name], M.D.

## 2014-10-24 MED ORDER — PROCHLORPERAZINE MALEATE 10 MG PO TABS: 10.0000 mg | ORAL_TABLET | Freq: Four times a day (QID) | ORAL | Status: DC | PRN

## 2014-10-26 ENCOUNTER — Other Ambulatory Visit: Payer: Medicaid Other

## 2014-10-26 ENCOUNTER — Telehealth: Payer: Self-pay | Admitting: *Deleted

## 2014-10-26 NOTE — Telephone Encounter (Signed)
Call from [facility] RN chemo ed, pt missed class today. Called pt, unable to reach, unable to leave message for pt. POF to scheduling. Pt does not drive, please call Freda Munro (step daughter) [phone] or Chanda Busing ( daughter) at [phone]

## 2014-10-27 ENCOUNTER — Encounter (HOSPITAL_COMMUNITY): Payer: Self-pay

## 2014-10-27 ENCOUNTER — Ambulatory Visit (HOSPITAL_COMMUNITY)
Admission: RE | Admit: 2014-10-27 | Discharge: 2014-10-27 | Disposition: A | Discharge status: Home / Self Care | Payer: Medicare Other | Source: Ambulatory Visit | Attending: Internal Medicine | Admitting: Internal Medicine

## 2014-10-27 ENCOUNTER — Ambulatory Visit
Admission: RE | Admit: 2014-10-27 | Discharge: 2014-10-27 | Disposition: A | Discharge status: Home / Self Care | Payer: Medicare Other | Source: Ambulatory Visit | Attending: Radiation Oncology | Admitting: Radiation Oncology

## 2014-10-27 ENCOUNTER — Other Ambulatory Visit: Payer: Self-pay | Admitting: Internal Medicine

## 2014-10-27 DIAGNOSIS — Z1810 Retained metal fragments, unspecified: Secondary | ICD-10-CM | POA: Insufficient documentation

## 2014-10-27 DIAGNOSIS — C3490 Malignant neoplasm of unspecified part of unspecified bronchus or lung: Secondary | ICD-10-CM | POA: Insufficient documentation

## 2014-10-27 DIAGNOSIS — C3492 Malignant neoplasm of unspecified part of left bronchus or lung: Secondary | ICD-10-CM

## 2014-10-27 DIAGNOSIS — C3411 Malignant neoplasm of upper lobe, right bronchus or lung: Secondary | ICD-10-CM | POA: Diagnosis not present

## 2014-10-27 DIAGNOSIS — Z87891 Personal history of nicotine dependence: Secondary | ICD-10-CM | POA: Diagnosis not present

## 2014-10-27 HISTORY — DX: Malignant (primary) neoplasm, unspecified: C80.1

## 2014-10-27 MED ORDER — IOHEXOL 300 MG/ML  SOLN
100.0000 mL | Freq: Once | INTRAMUSCULAR | Status: AC | PRN
  Administered 2014-10-27: 100 mL via INTRAVENOUS

## 2014-10-28 ENCOUNTER — Telehealth: Payer: Self-pay | Admitting: *Deleted

## 2014-10-28 ENCOUNTER — Ambulatory Visit (HOSPITAL_COMMUNITY): Payer: Medicaid Other

## 2014-10-28 DIAGNOSIS — C3411 Malignant neoplasm of upper lobe, right bronchus or lung: Secondary | ICD-10-CM | POA: Diagnosis not present

## 2014-10-28 DIAGNOSIS — Z87891 Personal history of nicotine dependence: Secondary | ICD-10-CM | POA: Diagnosis not present

## 2014-10-28 NOTE — Telephone Encounter (Signed)
Call to [facility] for Voltaren Gel at [phone].  Prior Authorization approved 10/28/2014 thru 10/28/2015.  Prior Authorization Code [id number].  [facility] called at [phone].  Sander Nephew, RN 10/28/2014 2:57 PM

## 2014-10-30 ENCOUNTER — Encounter: Payer: Self-pay | Admitting: *Deleted

## 2014-10-30 DIAGNOSIS — Z87891 Personal history of nicotine dependence: Secondary | ICD-10-CM | POA: Diagnosis not present

## 2014-10-30 DIAGNOSIS — C3411 Malignant neoplasm of upper lobe, right bronchus or lung: Secondary | ICD-10-CM | POA: Diagnosis not present

## 2014-10-30 NOTE — Progress Notes (Signed)
[place] Work  Clinical Social Work was referred by CT SIM for help with transportation.  Clinical Social Worker contacted patient's family member to offer support and assess for needs.  Patient is in need of transportation assistance.  CSW planned to meet with patient during infusion to apply for [employer] transportation.   Polo Riley, MSW, LCSW, OSW-C Clinical Social Worker [facility] [phone]

## 2014-11-02 ENCOUNTER — Telehealth: Payer: Self-pay | Admitting: *Deleted

## 2014-11-02 ENCOUNTER — Telehealth: Payer: Self-pay | Admitting: Radiation Oncology

## 2014-11-02 ENCOUNTER — Other Ambulatory Visit: Payer: Medicaid Other

## 2014-11-02 ENCOUNTER — Ambulatory Visit
Admission: RE | Admit: 2014-11-02 | Discharge: 2014-11-02 | Disposition: A | Discharge status: Home / Self Care | Payer: Medicare Other | Source: Ambulatory Visit | Attending: Radiation Oncology | Admitting: Radiation Oncology

## 2014-11-02 ENCOUNTER — Ambulatory Visit: Payer: Medicaid Other

## 2014-11-02 ENCOUNTER — Telehealth: Payer: Self-pay | Admitting: Medical Oncology

## 2014-11-02 ENCOUNTER — Telehealth: Payer: Self-pay | Admitting: Nurse Practitioner

## 2014-11-02 DIAGNOSIS — Z87891 Personal history of nicotine dependence: Secondary | ICD-10-CM | POA: Diagnosis not present

## 2014-11-02 DIAGNOSIS — C3411 Malignant neoplasm of upper lobe, right bronchus or lung: Secondary | ICD-10-CM | POA: Diagnosis not present

## 2014-11-02 NOTE — Telephone Encounter (Signed)
Pt came in states he forgot he had chemo and mixed it with another apt, sent msg to r/s chemo and then I will add labs with it and contact pt with D/T.... Cherylann Banas

## 2014-11-02 NOTE — Progress Notes (Signed)
Diane, RN requested the patient present to med onc scheduling to reschedule miss infusion appointment. Informed patient and family of such. They verbalized understanding.

## 2014-11-02 NOTE — Telephone Encounter (Signed)
Per staff message and POF I have scheduled appts. Advised scheduler of appts. JMW  

## 2014-11-02 NOTE — Telephone Encounter (Signed)
Per Lorriane Shire pt showed up to radiation and stated he did not know about chemo class or chemo appt. I called Sam in radiation and asked her to tell pt to come back to scheduling.

## 2014-11-02 NOTE — Telephone Encounter (Signed)
Patient orange folder with complete [employer] PROFESSIONAL VERIFICATION FORM in Dr. Johny Shears inbox to sign.

## 2014-11-03 ENCOUNTER — Ambulatory Visit
Admission: RE | Admit: 2014-11-03 | Discharge: 2014-11-03 | Disposition: A | Discharge status: Home / Self Care | Payer: Medicare Other | Source: Ambulatory Visit | Attending: Radiation Oncology | Admitting: Radiation Oncology

## 2014-11-03 ENCOUNTER — Encounter: Payer: Self-pay | Admitting: *Deleted

## 2014-11-03 ENCOUNTER — Other Ambulatory Visit: Payer: Medicare Other

## 2014-11-03 ENCOUNTER — Ambulatory Visit: Payer: Medicare Other | Admitting: Radiation Oncology

## 2014-11-03 ENCOUNTER — Telehealth: Payer: Self-pay | Admitting: *Deleted

## 2014-11-03 DIAGNOSIS — C3411 Malignant neoplasm of upper lobe, right bronchus or lung: Secondary | ICD-10-CM | POA: Diagnosis not present

## 2014-11-03 DIAGNOSIS — Z87891 Personal history of nicotine dependence: Secondary | ICD-10-CM | POA: Diagnosis not present

## 2014-11-03 NOTE — Telephone Encounter (Signed)
No additional note

## 2014-11-04 ENCOUNTER — Encounter: Payer: Self-pay | Admitting: Radiation Oncology

## 2014-11-04 ENCOUNTER — Ambulatory Visit
Admission: RE | Admit: 2014-11-04 | Discharge: 2014-11-04 | Disposition: A | Discharge status: Home / Self Care | Payer: Medicare Other | Source: Ambulatory Visit | Attending: Radiation Oncology | Admitting: Radiation Oncology

## 2014-11-04 ENCOUNTER — Encounter: Payer: Self-pay | Admitting: Medical Oncology

## 2014-11-04 ENCOUNTER — Ambulatory Visit: Payer: Medicare Other

## 2014-11-04 ENCOUNTER — Telehealth: Payer: Self-pay | Admitting: Radiation Oncology

## 2014-11-04 ENCOUNTER — Ambulatory Visit (HOSPITAL_BASED_OUTPATIENT_CLINIC_OR_DEPARTMENT_OTHER): Payer: Medicare Other

## 2014-11-04 VITALS — BP 184/78 | HR 66 | Resp 16

## 2014-11-04 VITALS — BP 134/59 | HR 58 | Temp 98.4°F | Resp 18

## 2014-11-04 DIAGNOSIS — Z5111 Encounter for antineoplastic chemotherapy: Secondary | ICD-10-CM

## 2014-11-04 DIAGNOSIS — C3492 Malignant neoplasm of unspecified part of left bronchus or lung: Secondary | ICD-10-CM | POA: Diagnosis not present

## 2014-11-04 DIAGNOSIS — Z87891 Personal history of nicotine dependence: Secondary | ICD-10-CM | POA: Insufficient documentation

## 2014-11-04 DIAGNOSIS — C3411 Malignant neoplasm of upper lobe, right bronchus or lung: Secondary | ICD-10-CM | POA: Diagnosis not present

## 2014-11-04 MED ORDER — SODIUM CHLORIDE 0.9 % IV SOLN
Freq: Once | INTRAVENOUS | Status: AC
  Administered 2014-11-04: 12:00:00 via INTRAVENOUS

## 2014-11-04 MED ORDER — SODIUM CHLORIDE 0.9 % IV SOLN
Freq: Once | INTRAVENOUS | Status: AC
  Administered 2014-11-04: 12:00:00 via INTRAVENOUS
  Filled 2014-11-04: qty 8

## 2014-11-04 MED ORDER — FAMOTIDINE IN NACL 20-0.9 MG/50ML-% IV SOLN
INTRAVENOUS | Status: AC
  Filled 2014-11-04: qty 50

## 2014-11-04 MED ORDER — DIPHENHYDRAMINE HCL 50 MG/ML IJ SOLN
50.0000 mg | Freq: Once | INTRAMUSCULAR | Status: AC
  Administered 2014-11-04: 50 mg via INTRAVENOUS

## 2014-11-04 MED ORDER — FAMOTIDINE IN NACL 20-0.9 MG/50ML-% IV SOLN
20.0000 mg | Freq: Once | INTRAVENOUS | Status: AC
  Administered 2014-11-04: 20 mg via INTRAVENOUS

## 2014-11-04 MED ORDER — PACLITAXEL CHEMO INJECTION 300 MG/50ML
45.0000 mg/m2 | Freq: Once | INTRAVENOUS | Status: AC
  Administered 2014-11-04: 108 mg via INTRAVENOUS
  Filled 2014-11-04: qty 18

## 2014-11-04 MED ORDER — SODIUM CHLORIDE 0.9 % IV SOLN
176.4000 mg | Freq: Once | INTRAVENOUS | Status: AC
  Administered 2014-11-04: 180 mg via INTRAVENOUS
  Filled 2014-11-04: qty 18

## 2014-11-04 MED ORDER — DIPHENHYDRAMINE HCL 50 MG/ML IJ SOLN
INTRAMUSCULAR | Status: AC
  Filled 2014-11-04: qty 1

## 2014-11-04 NOTE — Patient Instructions (Signed)
[facility] Discharge Instructions for Patients Receiving Chemotherapy  Today you received the following chemotherapy agents: Taxol, Carboplatin   To help prevent nausea and vomiting after your treatment, we encourage you to take your nausea medication as directed.    If you develop nausea and vomiting that is not controlled by your nausea medication, call the clinic.   BELOW ARE SYMPTOMS THAT SHOULD BE REPORTED IMMEDIATELY:  *FEVER GREATER THAN 100.5 F  *CHILLS WITH OR WITHOUT FEVER  NAUSEA AND VOMITING THAT IS NOT CONTROLLED WITH YOUR NAUSEA MEDICATION  *UNUSUAL SHORTNESS OF BREATH  *UNUSUAL BRUISING OR BLEEDING  TENDERNESS IN MOUTH AND THROAT WITH OR WITHOUT PRESENCE OF ULCERS  *URINARY PROBLEMS  *BOWEL PROBLEMS  UNUSUAL RASH Items with * indicate a potential emergency and should be followed up as soon as possible.  Feel free to call the clinic you have any questions or concerns. The clinic phone number is [phone].  Please show the [facility] at check-in to the Emergency Department and triage nurse.  Paclitaxel injection What is this medicine? PACLITAXEL (PAK li TAX el) is a chemotherapy drug. It targets fast dividing cells, like cancer cells, and causes these cells to die. This medicine is used to treat ovarian cancer, breast cancer, and other cancers. This medicine may be used for other purposes; ask your health care provider or pharmacist if you have questions. COMMON BRAND NAME(S): Onxol, Taxol What should I tell my health care provider before I take this medicine? They need to know if you have any of these conditions: -blood disorders -irregular heartbeat -infection (especially a virus infection such as chickenpox, cold sores, or herpes) -liver disease -previous or ongoing radiation therapy -an unusual or allergic reaction to paclitaxel, alcohol, polyoxyethylated castor oil, other chemotherapy agents, other medicines, foods, dyes, or  preservatives -pregnant or trying to get pregnant -breast-feeding How should I use this medicine? This drug is given as an infusion into a vein. It is administered in a hospital or clinic by a specially trained health care professional. Talk to your pediatrician regarding the use of this medicine in children. Special care may be needed. Overdosage: If you think you have taken too much of this medicine contact a poison control center or emergency room at once. NOTE: This medicine is only for you. Do not share this medicine with others. What if I miss a dose? It is important not to miss your dose. Call your doctor or health care professional if you are unable to keep an appointment. What may interact with this medicine? Do not take this medicine with any of the following medications: -disulfiram -metronidazole This medicine may also interact with the following medications: -cyclosporine -diazepam -ketoconazole -medicines to increase blood counts like filgrastim, pegfilgrastim, sargramostim -other chemotherapy drugs like cisplatin, doxorubicin, epirubicin, etoposide, teniposide, vincristine -quinidine -testosterone -vaccines -verapamil Talk to your doctor or health care professional before taking any of these medicines: -acetaminophen -aspirin -ibuprofen -ketoprofen -naproxen This list may not describe all possible interactions. Give your health care provider a list of all the medicines, herbs, non-prescription drugs, or dietary supplements you use. Also tell them if you smoke, drink alcohol, or use illegal drugs. Some items may interact with your medicine. What should I watch for while using this medicine? Your condition will be monitored carefully while you are receiving this medicine. You will need important blood work done while you are taking this medicine. This drug may make you feel generally unwell. This is not uncommon, as chemotherapy  can affect healthy cells as well as cancer  cells. Report any side effects. Continue your course of treatment even though you feel ill unless your doctor tells you to stop. In some cases, you may be given additional medicines to help with side effects. Follow all directions for their use. Call your doctor or health care professional for advice if you get a fever, chills or sore throat, or other symptoms of a cold or flu. Do not treat yourself. This drug decreases your body's ability to fight infections. Try to avoid being around people who are sick. This medicine may increase your risk to bruise or bleed. Call your doctor or health care professional if you notice any unusual bleeding. Be careful brushing and flossing your teeth or using a toothpick because you may get an infection or bleed more easily. If you have any dental work done, tell your dentist you are receiving this medicine. Avoid taking products that contain aspirin, acetaminophen, ibuprofen, naproxen, or ketoprofen unless instructed by your doctor. These medicines may hide a fever. Do not become pregnant while taking this medicine. Women should inform their doctor if they wish to become pregnant or think they might be pregnant. There is a potential for serious side effects to an unborn child. Talk to your health care professional or pharmacist for more information. Do not breast-feed an infant while taking this medicine. Men are advised not to father a child while receiving this medicine. What side effects may I notice from receiving this medicine? Side effects that you should report to your doctor or health care professional as soon as possible: -allergic reactions like skin rash, itching or hives, swelling of the face, lips, or tongue -low blood counts - This drug may decrease the number of white blood cells, red blood cells and platelets. You may be at increased risk for infections and bleeding. -signs of infection - fever or chills, cough, sore throat, pain or difficulty passing  urine -signs of decreased platelets or bleeding - bruising, pinpoint red spots on the skin, black, tarry stools, nosebleeds -signs of decreased red blood cells - unusually weak or tired, fainting spells, lightheadedness -breathing problems -chest pain -high or low blood pressure -mouth sores -nausea and vomiting -pain, swelling, redness or irritation at the injection site -pain, tingling, numbness in the hands or feet -slow or irregular heartbeat -swelling of the ankle, feet, hands Side effects that usually do not require medical attention (report to your doctor or health care professional if they continue or are bothersome): -bone pain -complete hair loss including hair on your head, underarms, pubic hair, eyebrows, and eyelashes -changes in the color of fingernails -diarrhea -loosening of the fingernails -loss of appetite -muscle or joint pain -red flush to skin -sweating This list may not describe all possible side effects. Call your doctor for medical advice about side effects. You may report side effects to FDA at 1-800-FDA-1088. Where should I keep my medicine? This drug is given in a hospital or clinic and will not be stored at home. NOTE: This sheet is a summary. It may not cover all possible information. If you have questions about this medicine, talk to your doctor, pharmacist, or health care provider.  2015, Elsevier/Gold Standard. (2012-06-17 16:41:21)   Carboplatin injection What is this medicine? CARBOPLATIN (KAR boe pla tin) is a chemotherapy drug. It targets fast dividing cells, like cancer cells, and causes these cells to die. This medicine is used to treat ovarian cancer and many other cancers. This medicine  may be used for other purposes; ask your health care provider or pharmacist if you have questions. COMMON BRAND NAME(S): Paraplatin What should I tell my health care provider before I take this medicine? They need to know if you have any of these  conditions: -blood disorders -hearing problems -kidney disease -recent or ongoing radiation therapy -an unusual or allergic reaction to carboplatin, cisplatin, other chemotherapy, other medicines, foods, dyes, or preservatives -pregnant or trying to get pregnant -breast-feeding How should I use this medicine? This drug is usually given as an infusion into a vein. It is administered in a hospital or clinic by a specially trained health care professional. Talk to your pediatrician regarding the use of this medicine in children. Special care may be needed. Overdosage: If you think you have taken too much of this medicine contact a poison control center or emergency room at once. NOTE: This medicine is only for you. Do not share this medicine with others. What if I miss a dose? It is important not to miss a dose. Call your doctor or health care professional if you are unable to keep an appointment. What may interact with this medicine? -medicines for seizures -medicines to increase blood counts like filgrastim, pegfilgrastim, sargramostim -some antibiotics like amikacin, gentamicin, neomycin, streptomycin, tobramycin -vaccines Talk to your doctor or health care professional before taking any of these medicines: -acetaminophen -aspirin -ibuprofen -ketoprofen -naproxen This list may not describe all possible interactions. Give your health care provider a list of all the medicines, herbs, non-prescription drugs, or dietary supplements you use. Also tell them if you smoke, drink alcohol, or use illegal drugs. Some items may interact with your medicine. What should I watch for while using this medicine? Your condition will be monitored carefully while you are receiving this medicine. You will need important blood work done while you are taking this medicine. This drug may make you feel generally unwell. This is not uncommon, as chemotherapy can affect healthy cells as well as cancer cells. Report  any side effects. Continue your course of treatment even though you feel ill unless your doctor tells you to stop. In some cases, you may be given additional medicines to help with side effects. Follow all directions for their use. Call your doctor or health care professional for advice if you get a fever, chills or sore throat, or other symptoms of a cold or flu. Do not treat yourself. This drug decreases your body's ability to fight infections. Try to avoid being around people who are sick. This medicine may increase your risk to bruise or bleed. Call your doctor or health care professional if you notice any unusual bleeding. Be careful brushing and flossing your teeth or using a toothpick because you may get an infection or bleed more easily. If you have any dental work done, tell your dentist you are receiving this medicine. Avoid taking products that contain aspirin, acetaminophen, ibuprofen, naproxen, or ketoprofen unless instructed by your doctor. These medicines may hide a fever. Do not become pregnant while taking this medicine. Women should inform their doctor if they wish to become pregnant or think they might be pregnant. There is a potential for serious side effects to an unborn child. Talk to your health care professional or pharmacist for more information. Do not breast-feed an infant while taking this medicine. What side effects may I notice from receiving this medicine? Side effects that you should report to your doctor or health care professional as soon as possible: -allergic reactions  like skin rash, itching or hives, swelling of the face, lips, or tongue -signs of infection - fever or chills, cough, sore throat, pain or difficulty passing urine -signs of decreased platelets or bleeding - bruising, pinpoint red spots on the skin, black, tarry stools, nosebleeds -signs of decreased red blood cells - unusually weak or tired, fainting spells, lightheadedness -breathing  problems -changes in hearing -changes in vision -chest pain -high blood pressure -low blood counts - This drug may decrease the number of white blood cells, red blood cells and platelets. You may be at increased risk for infections and bleeding. -nausea and vomiting -pain, swelling, redness or irritation at the injection site -pain, tingling, numbness in the hands or feet -problems with balance, talking, walking -trouble passing urine or change in the amount of urine Side effects that usually do not require medical attention (report to your doctor or health care professional if they continue or are bothersome): -hair loss -loss of appetite -metallic taste in the mouth or changes in taste This list may not describe all possible side effects. Call your doctor for medical advice about side effects. You may report side effects to FDA at 1-800-FDA-1088. Where should I keep my medicine? This drug is given in a hospital or clinic and will not be stored at home. NOTE: This sheet is a summary. It may not cover all possible information. If you have questions about this medicine, talk to your doctor, pharmacist, or health care provider.  2015, Elsevier/Gold Standard. (2007-07-30 14:38:05)

## 2014-11-04 NOTE — Progress Notes (Signed)
  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    ICD-9-CM ICD-10-CM   1. Cancer of upper lobe of right lung, stage IIIA adenocarcinoma 162.3 C34.11      Current Dose: 4 Gy  Planned Dose:  66 Gy  Narrative The patient presents for routine under treatment assessment. Vital signs obtained prior to start of Taxol and patient's 02 sats at 85% on RA. The patient vocalized that he "used to use oxygen at home but hasn't in a long time."  2L/[place] of oxygen was administered to the patient, as a result his 02 sats increased to 98%. Clear to proceed with Taxol treatment, Dr. Julien Nordmann notified. The patient's daughter stated that a CNA yesterday called an ambulance when he "acted like he was going to have a seizure and his blood pressure was real low." Set-up films were reviewed. The chart was checked.  Physical Findings  Patient is alert and oriented X3.  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 The patient is tolerating radiation.  Plan Continue as planned.  The patient is somewhat hypotensive, and may need BP meds held/modified during his treatment course.     This document serves as a record of services personally performed by Tyler Pita, MD. It was created on his behalf by Lenn Cal, a trained medical scribe. The creation of this record is based on the scribe's personal observations and the provider's statements to them. This document has been checked and approved by the attending provider.    Sheral Apley Tammi Klippel, M.D.

## 2014-11-04 NOTE — Telephone Encounter (Signed)
Placed complete and signed [place] in [employer].

## 2014-11-04 NOTE — Progress Notes (Signed)
Ok per [place] to treat pt today with chemo and labs from 10/22/14

## 2014-11-04 NOTE — Progress Notes (Signed)
1325: Vital signs obtained prior to start of Taxol and patient's 02 sats at 85% on RA. Pt in no apparent distress, pt denies any sob. States he "used to use oxygen at home but hasn't in a long time."  2L/[place] of oxygen administered to patient. 02 sats increased to 98%.  Dr. Julien Nordmann notified and okay to proceed with Taxol treatment.

## 2014-11-04 NOTE — Progress Notes (Signed)
Patient's daughter; Opal Sidles; reports that they had to call EMS yesterday due to the patient "lying down just shaking and unresponsive and low B/P" . Pt refused to go to hospital and states that he remembers everything and that he was not unresponsive. Pt states " I was just cold, once I  got my blankets and warmed up I was okay." VS and pt stable. Dr. Julien Nordmann aware and okay to treat per Dr. Julien Nordmann  1510: Patient escorted to bathroom via wheelchair, during transfer patient became incontinent of urine and patient's IV became dislodged. Taxol infusion complete, infusion stopped.  Patient taken to the bathroom, cleaned up, and clothes changed. IV discontinued and will be restarted for Carbo infusion.

## 2014-11-05 ENCOUNTER — Telehealth: Payer: Self-pay | Admitting: Medical Oncology

## 2014-11-05 ENCOUNTER — Encounter: Payer: Self-pay | Admitting: Medical Oncology

## 2014-11-05 ENCOUNTER — Ambulatory Visit
Admission: RE | Admit: 2014-11-05 | Discharge: 2014-11-05 | Disposition: A | Discharge status: Home / Self Care | Payer: Medicare Other | Source: Ambulatory Visit | Attending: Radiation Oncology | Admitting: Radiation Oncology

## 2014-11-05 DIAGNOSIS — C3411 Malignant neoplasm of upper lobe, right bronchus or lung: Secondary | ICD-10-CM | POA: Diagnosis not present

## 2014-11-05 DIAGNOSIS — Z87891 Personal history of nicotine dependence: Secondary | ICD-10-CM | POA: Diagnosis not present

## 2014-11-05 NOTE — Telephone Encounter (Signed)
erroneous

## 2014-11-05 NOTE — Telephone Encounter (Signed)
Pt contacted he feels good after his chemo yesterday . He is " drinking gatorade , ate a  chicken biscut " and laughing while we talked. He  Understands to call for any problems.

## 2014-11-06 ENCOUNTER — Ambulatory Visit
Admission: RE | Admit: 2014-11-06 | Discharge: 2014-11-06 | Disposition: A | Discharge status: Home / Self Care | Payer: Medicare Other | Source: Ambulatory Visit | Attending: Radiation Oncology | Admitting: Radiation Oncology

## 2014-11-06 DIAGNOSIS — C3411 Malignant neoplasm of upper lobe, right bronchus or lung: Secondary | ICD-10-CM | POA: Diagnosis not present

## 2014-11-06 DIAGNOSIS — Z87891 Personal history of nicotine dependence: Secondary | ICD-10-CM | POA: Diagnosis not present

## 2014-11-10 ENCOUNTER — Ambulatory Visit
Admission: RE | Admit: 2014-11-10 | Discharge: 2014-11-10 | Disposition: A | Discharge status: Home / Self Care | Payer: Medicare Other | Source: Ambulatory Visit | Attending: Radiation Oncology | Admitting: Radiation Oncology

## 2014-11-10 ENCOUNTER — Ambulatory Visit (HOSPITAL_BASED_OUTPATIENT_CLINIC_OR_DEPARTMENT_OTHER): Payer: Medicare Other

## 2014-11-10 ENCOUNTER — Telehealth: Payer: Self-pay | Admitting: *Deleted

## 2014-11-10 ENCOUNTER — Ambulatory Visit (HOSPITAL_BASED_OUTPATIENT_CLINIC_OR_DEPARTMENT_OTHER): Payer: Medicare Other | Admitting: Nurse Practitioner

## 2014-11-10 ENCOUNTER — Other Ambulatory Visit (HOSPITAL_BASED_OUTPATIENT_CLINIC_OR_DEPARTMENT_OTHER): Payer: Medicare Other

## 2014-11-10 VITALS — BP 115/99 | HR 75 | Temp 98.6°F | Resp 18 | Ht 71.0 in | Wt 241.2 lb

## 2014-11-10 DIAGNOSIS — C3411 Malignant neoplasm of upper lobe, right bronchus or lung: Secondary | ICD-10-CM

## 2014-11-10 DIAGNOSIS — Z5111 Encounter for antineoplastic chemotherapy: Secondary | ICD-10-CM

## 2014-11-10 DIAGNOSIS — R918 Other nonspecific abnormal finding of lung field: Secondary | ICD-10-CM

## 2014-11-10 DIAGNOSIS — Z87891 Personal history of nicotine dependence: Secondary | ICD-10-CM | POA: Diagnosis not present

## 2014-11-10 DIAGNOSIS — C3492 Malignant neoplasm of unspecified part of left bronchus or lung: Secondary | ICD-10-CM

## 2014-11-10 LAB — CBC WITH DIFFERENTIAL/PLATELET
BASO%: 0.6 % (ref 0.0–2.0)
BASOS ABS: 0.1 10*3/uL (ref 0.0–0.1)
EOS ABS: 0.2 10*3/uL (ref 0.0–0.5)
EOS%: 1.8 % (ref 0.0–7.0)
HCT: 29.7 % — ABNORMAL LOW (ref 38.4–49.9)
HGB: 9.3 g/dL — ABNORMAL LOW (ref 13.0–17.1)
LYMPH%: 9.5 % — ABNORMAL LOW (ref 14.0–49.0)
MCH: 23.9 pg — ABNORMAL LOW (ref 27.2–33.4)
MCHC: 31.4 g/dL — ABNORMAL LOW (ref 32.0–36.0)
MCV: 75.9 fL — AB (ref 79.3–98.0)
MONO#: 0.5 10*3/uL (ref 0.1–0.9)
MONO%: 5.2 % (ref 0.0–14.0)
NEUT%: 82.9 % — ABNORMAL HIGH (ref 39.0–75.0)
NEUTROS ABS: 7.6 10*3/uL — AB (ref 1.5–6.5)
PLATELETS: 350 10*3/uL (ref 140–400)
RBC: 3.91 10*6/uL — AB (ref 4.20–5.82)
RDW: 17 % — ABNORMAL HIGH (ref 11.0–14.6)
WBC: 9.2 10*3/uL (ref 4.0–10.3)
lymph#: 0.9 10*3/uL (ref 0.9–3.3)

## 2014-11-10 LAB — COMPREHENSIVE METABOLIC PANEL (CC13)
ALT: 10 U/L (ref 0–55)
AST: 12 U/L (ref 5–34)
Albumin: 2.7 g/dL — ABNORMAL LOW (ref 3.5–5.0)
Alkaline Phosphatase: 71 U/L (ref 40–150)
Anion Gap: 10 mEq/L (ref 3–11)
BUN: 18.4 mg/dL (ref 7.0–26.0)
CALCIUM: 8.6 mg/dL (ref 8.4–10.4)
CO2: 28 mEq/L (ref 22–29)
CREATININE: 1.1 mg/dL (ref 0.7–1.3)
Chloride: 101 mEq/L (ref 98–109)
EGFR: 72 mL/min/{1.73_m2} — ABNORMAL LOW (ref >90)
Glucose: 117 mg/dl (ref 70–140)
POTASSIUM: 3.5 meq/L (ref 3.5–5.1)
Sodium: 138 mEq/L (ref 136–145)
Total Bilirubin: 0.39 mg/dL (ref 0.20–1.20)
Total Protein: 6.8 g/dL (ref 6.4–8.3)

## 2014-11-10 MED ORDER — DIPHENHYDRAMINE HCL 50 MG/ML IJ SOLN
50.0000 mg | Freq: Once | INTRAMUSCULAR | Status: AC
  Administered 2014-11-10: 50 mg via INTRAVENOUS

## 2014-11-10 MED ORDER — RADIAPLEXRX EX GEL
Freq: Once | CUTANEOUS | Status: AC
  Administered 2014-11-10: 11:00:00 via TOPICAL

## 2014-11-10 MED ORDER — DIPHENHYDRAMINE HCL 50 MG/ML IJ SOLN
INTRAMUSCULAR | Status: AC
  Filled 2014-11-10: qty 1

## 2014-11-10 MED ORDER — FAMOTIDINE IN NACL 20-0.9 MG/50ML-% IV SOLN
INTRAVENOUS | Status: AC
  Filled 2014-11-10: qty 50

## 2014-11-10 MED ORDER — PACLITAXEL CHEMO INJECTION 300 MG/50ML
45.0000 mg/m2 | Freq: Once | INTRAVENOUS | Status: AC
  Administered 2014-11-10: 108 mg via INTRAVENOUS
  Filled 2014-11-10: qty 18

## 2014-11-10 MED ORDER — SODIUM CHLORIDE 0.9 % IV SOLN
Freq: Once | INTRAVENOUS | Status: AC
  Administered 2014-11-10: 11:00:00 via INTRAVENOUS
  Filled 2014-11-10: qty 8

## 2014-11-10 MED ORDER — SODIUM CHLORIDE 0.9 % IV SOLN
176.4000 mg | Freq: Once | INTRAVENOUS | Status: AC
  Administered 2014-11-10: 180 mg via INTRAVENOUS
  Filled 2014-11-10: qty 18

## 2014-11-10 MED ORDER — SODIUM CHLORIDE 0.9 % IV SOLN
Freq: Once | INTRAVENOUS | Status: AC
  Administered 2014-11-10: 11:00:00 via INTRAVENOUS

## 2014-11-10 MED ORDER — FAMOTIDINE IN NACL 20-0.9 MG/50ML-% IV SOLN
20.0000 mg | Freq: Once | INTRAVENOUS | Status: AC
  Administered 2014-11-10: 20 mg via INTRAVENOUS

## 2014-11-10 NOTE — Telephone Encounter (Signed)
Per staff message and POF I have scheduled appts. Advised scheduler of appts. JMW  

## 2014-11-10 NOTE — Patient Instructions (Signed)
[facility] Discharge Instructions for Patients Receiving Chemotherapy  Today you received the following chemotherapy agents taxol/carboplatin  To help prevent nausea and vomiting after your treatment, we encourage you to take your nausea medication as directed   If you develop nausea and vomiting that is not controlled by your nausea medication, call the clinic.   BELOW ARE SYMPTOMS THAT SHOULD BE REPORTED IMMEDIATELY:  *FEVER GREATER THAN 100.5 F  *CHILLS WITH OR WITHOUT FEVER  NAUSEA AND VOMITING THAT IS NOT CONTROLLED WITH YOUR NAUSEA MEDICATION  *UNUSUAL SHORTNESS OF BREATH  *UNUSUAL BRUISING OR BLEEDING  TENDERNESS IN MOUTH AND THROAT WITH OR WITHOUT PRESENCE OF ULCERS  *URINARY PROBLEMS  *BOWEL PROBLEMS  UNUSUAL RASH Items with * indicate a potential emergency and should be followed up as soon as possible.  Feel free to call the clinic you have any questions or concerns. The clinic phone number is [phone].  

## 2014-11-10 NOTE — Addendum Note (Signed)
Encounter addended by: Heywood Footman, RN on: 11/10/2014 10:54 AM<BR>     Documentation filed: Notes Section, Inpatient Patient Education, Dx Association, Orders

## 2014-11-10 NOTE — Progress Notes (Signed)
  [place] OFFICE PROGRESS NOTE   Diagnosis: Lung cancer   INTERVAL HISTORY:   Mr. Achille returns as scheduled. He began radiation on 11/03/2014 and weekly Taxol/carboplatin 11/04/2014. He denies nausea/vomiting following the chemotherapy. No mouth sores. No diarrhea or constipation. No rash. He has stable dyspnea. No fever. No significant right shoulder pain. He reports a good appetite.  Objective:  Vital signs in last 24 hours:  Blood pressure 115/99, pulse 75, temperature 98.6 F (37 C), temperature source Oral, resp. rate 18, height '5\' 11"'$  (1.803 m), weight 241 lb 3.2 oz (109.408 kg).    HEENT: No thrush or ulcers. Lymphatics: No palpable cervical or supra-clavicular lymph nodes. Resp: Distant breath sounds. Cardio: Regular rate and rhythm. GI: Abdomen soft and nontender. No hepatomegaly. Vascular: Trace to 1+ bilateral lower leg edema. Calves nontender. Neuro: Alert and oriented.  Skin: No rash.    Lab Results:  Lab Results  Component Value Date   WBC 9.2 11/10/2014   HGB 9.3* 11/10/2014   HCT 29.7* 11/10/2014   MCV 75.9* 11/10/2014   PLT 350 11/10/2014   NEUTROABS 7.6* 11/10/2014    Imaging:  No results found.  Medications: I have reviewed the patient's current medications.  Assessment/Plan: 1. Stage IIIA (T2a, N2, M0), non-small cell lung cancer, adenocarcinoma diagnosed in May 2016 presenting with a large right apical mass and highly suspicious mediastinal lymphadenopathy. Radiation initiated 11/03/2014. Weekly Taxol/carboplatin initiated 11/04/2014.   Disposition: Mr. Boniface appears stable. Plan to continue weekly Taxol/carboplatin during the course of radiation. He will return for a follow-up visit in one week. He will contact the office in the interim with any problems.    Ned Card ANP/GNP-BC   11/10/2014  10:24 AM

## 2014-11-10 NOTE — Progress Notes (Signed)
Oriented patient and his family to staff and routine of the clinic. Provided patient with RADIATION THERAPY AND YOU handbook then, reviewed pertinent information. Educated patient reference potential side effects and management such as, fatigue, skin changes, throat changes, cough, and SOB. Provided patient with radiaplex and directed upon use. Provided patient with this RN's business card and encouraged to call with needs. Patient and family verbalized understanding of all reviewed.

## 2014-11-10 NOTE — Addendum Note (Signed)
Encounter addended by: Heywood Footman, RN on: 11/10/2014 10:55 AM<BR>     Documentation filed: Inpatient MAR, [place] Section

## 2014-11-11 ENCOUNTER — Encounter: Payer: Self-pay | Admitting: *Deleted

## 2014-11-11 ENCOUNTER — Ambulatory Visit
Admission: RE | Admit: 2014-11-11 | Discharge: 2014-11-11 | Disposition: A | Discharge status: Home / Self Care | Payer: Medicare Other | Source: Ambulatory Visit | Attending: Radiation Oncology | Admitting: Radiation Oncology

## 2014-11-11 DIAGNOSIS — Z87891 Personal history of nicotine dependence: Secondary | ICD-10-CM | POA: Diagnosis not present

## 2014-11-11 DIAGNOSIS — C3411 Malignant neoplasm of upper lobe, right bronchus or lung: Secondary | ICD-10-CM | POA: Diagnosis not present

## 2014-11-11 NOTE — Progress Notes (Signed)
Nicholas [name]  Clinical Social [name] was referred by nurse for assessment of psychosocial needs due to transportation concerns.  Clinical Social Worker attempted to contact patient's daughter as directed. CSW left message for daughter to return CSW call on 11/10/14. CSW will continue to follow in attempt to address transportation needs.     Loren Racer, [place] Worker [place]  [place] Phone: [phone] Fax: [phone]

## 2014-11-12 ENCOUNTER — Ambulatory Visit
Admission: RE | Admit: 2014-11-12 | Discharge: 2014-11-12 | Disposition: A | Discharge status: Home / Self Care | Payer: Medicare Other | Source: Ambulatory Visit | Attending: Radiation Oncology | Admitting: Radiation Oncology

## 2014-11-12 DIAGNOSIS — C3411 Malignant neoplasm of upper lobe, right bronchus or lung: Secondary | ICD-10-CM | POA: Diagnosis not present

## 2014-11-12 DIAGNOSIS — Z87891 Personal history of nicotine dependence: Secondary | ICD-10-CM | POA: Diagnosis not present

## 2014-11-13 ENCOUNTER — Ambulatory Visit
Admission: RE | Admit: 2014-11-13 | Discharge: 2014-11-13 | Disposition: A | Discharge status: Home / Self Care | Payer: Medicare Other | Source: Ambulatory Visit | Attending: Radiation Oncology | Admitting: Radiation Oncology

## 2014-11-13 ENCOUNTER — Encounter: Payer: Self-pay | Admitting: Radiation Oncology

## 2014-11-13 VITALS — BP 131/74 | HR 76 | Resp 16 | Wt 240.1 lb

## 2014-11-13 DIAGNOSIS — Z87891 Personal history of nicotine dependence: Secondary | ICD-10-CM | POA: Diagnosis not present

## 2014-11-13 DIAGNOSIS — C3411 Malignant neoplasm of upper lobe, right bronchus or lung: Secondary | ICD-10-CM | POA: Diagnosis not present

## 2014-11-13 NOTE — Progress Notes (Signed)
Weight and vitals stable. Denies pain. Reports a productive cough. Denies SOB. Denies difficulty swallowing. Denies headache, dizziness, nausea or vomiting.  BP 131/74 mmHg  Pulse 76  Resp 16  Wt 240 lb 1.6 oz (108.909 kg) Wt Readings from Last 3 Encounters:  11/13/14 240 lb 1.6 oz (108.909 kg)  11/10/14 241 lb 3.2 oz (109.408 kg)  10/22/14 242 lb 12.8 oz (110.133 kg)

## 2014-11-13 NOTE — Progress Notes (Signed)
  Radiation Oncology         [phone]   Name: Nicholas [name] MRN: [id number]   Date: 11/13/2014  DOB: 1934/12/05   Weekly Radiation Therapy Management  Diagnosis:   ICD-9-CM ICD-10-CM   1. Cancer of upper lobe of right lung, stage IIIA adenocarcinoma 162.3 C34.11      Current Dose: 16 Gy  Planned Dose:  66 Gy  Narrative The patient presents for routine under treatment assessment. Weight and vitals stable. Denies pain, headache, dizziness, nausea or vomiting, difficulty swallowing, or sob. Reports a productive cough. The pt has no additional complaints at this time. Set-up films were reviewed. The chart was checked.  Physical Findings BP 131/74 mmHg  Pulse 76  Resp 16  Wt 240 lb 1.6 oz (108.909 kg)  Wt Readings from Last 3 Encounters:   11/13/14  240 lb 1.6 oz (108.909 kg)   11/10/14  241 lb 3.2 oz (109.408 kg)   10/22/14  242 lb 12.8 oz (110.133 kg)  Patient is alert and oriented X3.  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 The patient is tolerating radiation.  Plan Continue radiation treatment as planned.   This document serves as a record of services personally performed by Tyler Pita, MD. It was created on his behalf by Darcus Austin, a trained medical scribe. The creation of this record is based on the scribe's personal observations and the provider's statements to them. This document has been checked and approved by the attending provider.      Sheral Apley Tammi Klippel, M.D.

## 2014-11-16 ENCOUNTER — Encounter: Payer: Self-pay | Admitting: Physician Assistant

## 2014-11-16 ENCOUNTER — Ambulatory Visit (HOSPITAL_BASED_OUTPATIENT_CLINIC_OR_DEPARTMENT_OTHER): Payer: Medicare Other

## 2014-11-16 ENCOUNTER — Other Ambulatory Visit: Payer: Self-pay | Admitting: Physician Assistant

## 2014-11-16 ENCOUNTER — Ambulatory Visit (HOSPITAL_BASED_OUTPATIENT_CLINIC_OR_DEPARTMENT_OTHER): Payer: Medicare Other | Admitting: Physician Assistant

## 2014-11-16 ENCOUNTER — Ambulatory Visit
Admission: RE | Admit: 2014-11-16 | Discharge: 2014-11-16 | Disposition: A | Discharge status: Home / Self Care | Payer: Medicare Other | Source: Ambulatory Visit | Attending: Radiation Oncology | Admitting: Radiation Oncology

## 2014-11-16 ENCOUNTER — Other Ambulatory Visit (HOSPITAL_BASED_OUTPATIENT_CLINIC_OR_DEPARTMENT_OTHER): Payer: Medicare Other

## 2014-11-16 ENCOUNTER — Ambulatory Visit: Payer: Medicaid Other

## 2014-11-16 VITALS — BP 126/52 | HR 72 | Temp 97.6°F | Resp 18 | Wt 241.6 lb

## 2014-11-16 DIAGNOSIS — C3411 Malignant neoplasm of upper lobe, right bronchus or lung: Secondary | ICD-10-CM | POA: Diagnosis not present

## 2014-11-16 DIAGNOSIS — C3492 Malignant neoplasm of unspecified part of left bronchus or lung: Secondary | ICD-10-CM

## 2014-11-16 DIAGNOSIS — Z87891 Personal history of nicotine dependence: Secondary | ICD-10-CM | POA: Diagnosis not present

## 2014-11-16 DIAGNOSIS — Z5111 Encounter for antineoplastic chemotherapy: Secondary | ICD-10-CM

## 2014-11-16 DIAGNOSIS — D649 Anemia, unspecified: Secondary | ICD-10-CM | POA: Diagnosis not present

## 2014-11-16 LAB — CBC WITH DIFFERENTIAL/PLATELET
BASO%: 0.9 % (ref 0.0–2.0)
Basophils Absolute: 0 10*3/uL (ref 0.0–0.1)
EOS%: 3.2 % (ref 0.0–7.0)
Eosinophils Absolute: 0.2 10*3/uL (ref 0.0–0.5)
HEMATOCRIT: 28.1 % — AB (ref 38.4–49.9)
HEMOGLOBIN: 8.8 g/dL — AB (ref 13.0–17.1)
LYMPH#: 0.7 10*3/uL — AB (ref 0.9–3.3)
LYMPH%: 11.8 % — ABNORMAL LOW (ref 14.0–49.0)
MCH: 24.2 pg — ABNORMAL LOW (ref 27.2–33.4)
MCHC: 31.3 g/dL — ABNORMAL LOW (ref 32.0–36.0)
MCV: 77.3 fL — AB (ref 79.3–98.0)
MONO#: 0.4 10*3/uL (ref 0.1–0.9)
MONO%: 7.8 % (ref 0.0–14.0)
NEUT#: 4.2 10*3/uL (ref 1.5–6.5)
NEUT%: 76.3 % — ABNORMAL HIGH (ref 39.0–75.0)
Platelets: 253 10*3/uL (ref 140–400)
RBC: 3.63 10*6/uL — ABNORMAL LOW (ref 4.20–5.82)
RDW: 17 % — ABNORMAL HIGH (ref 11.0–14.6)
WBC: 5.5 10*3/uL (ref 4.0–10.3)

## 2014-11-16 LAB — COMPREHENSIVE METABOLIC PANEL (CC13)
ALBUMIN: 2.7 g/dL — AB (ref 3.5–5.0)
ALT: 13 U/L (ref 0–55)
AST: 10 U/L (ref 5–34)
Alkaline Phosphatase: 64 U/L (ref 40–150)
Anion Gap: 7 mEq/L (ref 3–11)
BUN: 19.6 mg/dL (ref 7.0–26.0)
CALCIUM: 8.3 mg/dL — AB (ref 8.4–10.4)
CHLORIDE: 104 meq/L (ref 98–109)
CO2: 29 mEq/L (ref 22–29)
Creatinine: 1.3 mg/dL (ref 0.7–1.3)
EGFR: 62 mL/min/{1.73_m2} — ABNORMAL LOW (ref >90)
GLUCOSE: 123 mg/dL (ref 70–140)
Potassium: 3.9 mEq/L (ref 3.5–5.1)
Sodium: 141 mEq/L (ref 136–145)
TOTAL PROTEIN: 6.4 g/dL (ref 6.4–8.3)
Total Bilirubin: 0.32 mg/dL (ref 0.20–1.20)

## 2014-11-16 MED ORDER — DIPHENHYDRAMINE HCL 50 MG/ML IJ SOLN
50.0000 mg | Freq: Once | INTRAMUSCULAR | Status: AC
  Administered 2014-11-16: 50 mg via INTRAVENOUS

## 2014-11-16 MED ORDER — SODIUM CHLORIDE 0.9 % IV SOLN
Freq: Once | INTRAVENOUS | Status: AC
  Administered 2014-11-16: 12:00:00 via INTRAVENOUS
  Filled 2014-11-16: qty 8

## 2014-11-16 MED ORDER — INTEGRA PLUS PO CAPS: 1.0000 | ORAL_CAPSULE | Freq: Every day | ORAL | Status: AC

## 2014-11-16 MED ORDER — FAMOTIDINE IN NACL 20-0.9 MG/50ML-% IV SOLN
20.0000 mg | Freq: Once | INTRAVENOUS | Status: AC
  Administered 2014-11-16: 20 mg via INTRAVENOUS

## 2014-11-16 MED ORDER — DIPHENHYDRAMINE HCL 50 MG/ML IJ SOLN
INTRAMUSCULAR | Status: AC
  Filled 2014-11-16: qty 1

## 2014-11-16 MED ORDER — SODIUM CHLORIDE 0.9 % IV SOLN
Freq: Once | INTRAVENOUS | Status: AC
  Administered 2014-11-16: 12:00:00 via INTRAVENOUS

## 2014-11-16 MED ORDER — DEXTROSE 5 % IV SOLN
45.0000 mg/m2 | Freq: Once | INTRAVENOUS | Status: AC
  Administered 2014-11-16: 108 mg via INTRAVENOUS
  Filled 2014-11-16: qty 18

## 2014-11-16 MED ORDER — FAMOTIDINE IN NACL 20-0.9 MG/50ML-% IV SOLN
INTRAVENOUS | Status: AC
  Filled 2014-11-16: qty 50

## 2014-11-16 MED ORDER — SODIUM CHLORIDE 0.9 % IV SOLN
176.4000 mg | Freq: Once | INTRAVENOUS | Status: AC
  Administered 2014-11-16: 180 mg via INTRAVENOUS
  Filled 2014-11-16: qty 18

## 2014-11-16 NOTE — Progress Notes (Addendum)
  [facility] OFFICE PROGRESS NOTE   Diagnosis: Lung cancer   INTERVAL HISTORY:   [name] returns as scheduled. He began radiation on 11/03/2014 and weekly Taxol/carboplatin 11/04/2014. Thus far he is tolerating his course of concurrent chemoradiation without difficulty. He denies nausea/vomiting following the chemotherapy. No mouth sores. No diarrhea or constipation. No rash. He has stable dyspnea. No fever. No significant right shoulder pain. He reports a good appetite. He is sleeping well.  Objective:  Vital signs in last 24 hours:  Blood pressure 126/52, pulse 72, temperature 97.6 F (36.4 C), temperature source Oral, resp. rate 18, weight 241 lb 9.6 oz (109.589 kg), SpO2 99 %.    HEENT: No thrush or ulcers. Lymphatics: No palpable cervical or supra-clavicular lymph nodes. Resp: Distant breath sounds. Cardio: Regular rate and rhythm. GI: Abdomen soft and nontender. No hepatomegaly. Vascular: Trace to 1+ bilateral lower leg edema. Calves nontender. Neuro: Alert and oriented.  Skin: No rash.    Lab Results:  Lab Results  Component Value Date   WBC 5.5 11/16/2014   HGB 8.8* 11/16/2014   HCT 28.1* 11/16/2014   MCV 77.3* 11/16/2014   PLT 253 11/16/2014   NEUTROABS 4.2 11/16/2014    Imaging:  No results found.  Medications: I have reviewed the patient's current medications.  Assessment/Plan: 1. Stage IIIA (T2a, N2, M0), non-small cell lung cancer, adenocarcinoma diagnosed in May 2016 presenting with a large right apical mass and highly suspicious mediastinal lymphadenopathy. Radiation initiated 11/03/2014. Weekly Taxol/carboplatin initiated 11/04/2014.   Disposition: Patient was discussed with and also seen by Dr. Julien Nordmann. [name] appears stable. He will continue weekly Taxol/carboplatin during the course of radiation. He will return for a follow-up visit in 2 weeks. He will contact the office in the interim with any  problems/concerns.    Awilda Metro E PA-C  11/16/2014  4:28 PM  ADDENDUM: Hematology/Oncology Attending: I had a face to face encounter with the patient today. I recommended his care plan. This is a very pleasant 80 years old African-American male recently diagnosed with a stage IIIa non-small cell lung cancer and currently undergoing concurrent chemoradiation with weekly carboplatin and paclitaxel. He is tolerating his treatment fairly well with no significant adverse effects. He denied having any significant weight loss or night sweats. He has no nausea or vomiting. No significant dysphagia or odynophagia. I recommended for the patient to proceed with his treatment today as scheduled. He will come back for follow-up visit in 2 weeks for reevaluation and management of any adverse effect of his treatment. For the anemia, I will start the patient on oral iron supplements. He was advised to call immediately if he has any concerning symptoms in the interval.  Disclaimer: This note was dictated with voice recognition software. Similar sounding words can inadvertently be transcribed and may be missed upon review. Eilleen Kempf., MD 11/16/2014

## 2014-11-16 NOTE — Patient Instructions (Signed)
[facility] Discharge Instructions for Patients Receiving Chemotherapy  Today you received the following chemotherapy agents Taxol and Carboplatin  To help prevent nausea and vomiting after your treatment, we encourage you to take your nausea medication     If you develop nausea and vomiting that is not controlled by your nausea medication, call the clinic.   BELOW ARE SYMPTOMS THAT SHOULD BE REPORTED IMMEDIATELY:  *FEVER GREATER THAN 100.5 F  *CHILLS WITH OR WITHOUT FEVER  NAUSEA AND VOMITING THAT IS NOT CONTROLLED WITH YOUR NAUSEA MEDICATION  *UNUSUAL SHORTNESS OF BREATH  *UNUSUAL BRUISING OR BLEEDING  TENDERNESS IN MOUTH AND THROAT WITH OR WITHOUT PRESENCE OF ULCERS  *URINARY PROBLEMS  *BOWEL PROBLEMS  UNUSUAL RASH Items with * indicate a potential emergency and should be followed up as soon as possible.  Feel free to call the clinic you have any questions or concerns. The clinic phone number is [phone].  Please show the CHEMO ALERT CARD at check-in to the Emergency Department and triage nurse.   

## 2014-11-16 NOTE — Patient Instructions (Signed)
Continue your course of concurrent chemoradiation as scheduled Followup in 2 weeks 

## 2014-11-17 ENCOUNTER — Telehealth: Payer: Self-pay | Admitting: *Deleted

## 2014-11-17 ENCOUNTER — Ambulatory Visit
Admission: RE | Admit: 2014-11-17 | Discharge: 2014-11-17 | Disposition: A | Discharge status: Home / Self Care | Payer: Medicare Other | Source: Ambulatory Visit | Attending: Radiation Oncology | Admitting: Radiation Oncology

## 2014-11-17 DIAGNOSIS — Z87891 Personal history of nicotine dependence: Secondary | ICD-10-CM | POA: Diagnosis not present

## 2014-11-17 DIAGNOSIS — C3411 Malignant neoplasm of upper lobe, right bronchus or lung: Secondary | ICD-10-CM | POA: Diagnosis not present

## 2014-11-17 NOTE — Telephone Encounter (Signed)
Called and informed patient that a prescription for Integra Plus has been sent to his pharmacy and he will need to take it once daily.  Per Awilda Metro, PA.  Patient verbalized understanding.

## 2014-11-18 ENCOUNTER — Ambulatory Visit
Admission: RE | Admit: 2014-11-18 | Discharge: 2014-11-18 | Disposition: A | Discharge status: Home / Self Care | Payer: Medicare Other | Source: Ambulatory Visit | Attending: Radiation Oncology | Admitting: Radiation Oncology

## 2014-11-18 DIAGNOSIS — C3411 Malignant neoplasm of upper lobe, right bronchus or lung: Secondary | ICD-10-CM | POA: Diagnosis not present

## 2014-11-18 DIAGNOSIS — Z87891 Personal history of nicotine dependence: Secondary | ICD-10-CM | POA: Diagnosis not present

## 2014-11-19 ENCOUNTER — Ambulatory Visit
Admission: RE | Admit: 2014-11-19 | Discharge: 2014-11-19 | Disposition: A | Discharge status: Home / Self Care | Payer: Medicare Other | Source: Ambulatory Visit | Attending: Radiation Oncology | Admitting: Radiation Oncology

## 2014-11-19 DIAGNOSIS — C3411 Malignant neoplasm of upper lobe, right bronchus or lung: Secondary | ICD-10-CM | POA: Diagnosis not present

## 2014-11-19 DIAGNOSIS — Z87891 Personal history of nicotine dependence: Secondary | ICD-10-CM | POA: Diagnosis not present

## 2014-11-20 ENCOUNTER — Ambulatory Visit
Admission: RE | Admit: 2014-11-20 | Discharge: 2014-11-20 | Disposition: A | Discharge status: Home / Self Care | Payer: Medicare Other | Source: Ambulatory Visit | Attending: Radiation Oncology | Admitting: Radiation Oncology

## 2014-11-20 VITALS — BP 125/66 | HR 94 | Temp 98.4°F | Resp 20 | Wt 241.0 lb

## 2014-11-20 DIAGNOSIS — C3411 Malignant neoplasm of upper lobe, right bronchus or lung: Secondary | ICD-10-CM | POA: Diagnosis not present

## 2014-11-20 DIAGNOSIS — Z87891 Personal history of nicotine dependence: Secondary | ICD-10-CM | POA: Diagnosis not present

## 2014-11-20 NOTE — Progress Notes (Signed)
  Radiation Oncology         [phone]   Name: Nicholas [name] MRN: [id number]   Date: 11/20/2014  DOB: August 31, 1934   Weekly Radiation Therapy Management    ICD-9-CM ICD-10-CM   1. Cancer of upper lobe of right lung, stage IIIA adenocarcinoma 162.3 C34.11     Current Dose: 36 Gy  Planned Dose:  66 Gy  Narrative The patient presents for routine under treatment assessment. Weekly assessment of radiation to right lung.Completed  13 of 33 treatments.Denies pain or shortness of breath.Has a non-productive cough most of times but did have some thick yellow phlegm today.  The patient is without complaint. Set-up films were reviewed. The chart was checked.  Physical Findings  weight is 241 lb (109.317 kg). His temperature is 98.4 F (36.9 C). His blood pressure is 125/66 and his pulse is 94. His respiration is 20 and oxygen saturation is 98%. . Weight essentially stable.  No significant changes.  Impression The patient is tolerating radiation.  Plan Continue treatment as planned.         Sheral Apley Tammi Klippel, M.D.

## 2014-11-20 NOTE — Progress Notes (Signed)
Weekly assessment of radiation to right lung.Completed  13 of 33 treatments.Denies pain or shortness of breath.Has a non-productive cough most of times but did have some thick yellow phlegm today.

## 2014-11-23 ENCOUNTER — Ambulatory Visit (HOSPITAL_BASED_OUTPATIENT_CLINIC_OR_DEPARTMENT_OTHER): Payer: Medicare Other

## 2014-11-23 ENCOUNTER — Ambulatory Visit
Admission: RE | Admit: 2014-11-23 | Discharge: 2014-11-23 | Disposition: A | Discharge status: Home / Self Care | Payer: Medicare Other | Source: Ambulatory Visit | Attending: Radiation Oncology | Admitting: Radiation Oncology

## 2014-11-23 ENCOUNTER — Other Ambulatory Visit (HOSPITAL_BASED_OUTPATIENT_CLINIC_OR_DEPARTMENT_OTHER): Payer: Medicare Other

## 2014-11-23 VITALS — BP 155/72 | HR 83 | Temp 97.8°F

## 2014-11-23 DIAGNOSIS — Z5111 Encounter for antineoplastic chemotherapy: Secondary | ICD-10-CM

## 2014-11-23 DIAGNOSIS — C3492 Malignant neoplasm of unspecified part of left bronchus or lung: Secondary | ICD-10-CM

## 2014-11-23 DIAGNOSIS — C3411 Malignant neoplasm of upper lobe, right bronchus or lung: Secondary | ICD-10-CM | POA: Diagnosis not present

## 2014-11-23 DIAGNOSIS — Z87891 Personal history of nicotine dependence: Secondary | ICD-10-CM | POA: Diagnosis not present

## 2014-11-23 LAB — COMPREHENSIVE METABOLIC PANEL (CC13)
ALT: 10 U/L (ref 0–55)
AST: 10 U/L (ref 5–34)
Albumin: 2.7 g/dL — ABNORMAL LOW (ref 3.5–5.0)
Alkaline Phosphatase: 68 U/L (ref 40–150)
Anion Gap: 8 mEq/L (ref 3–11)
BILIRUBIN TOTAL: 0.29 mg/dL (ref 0.20–1.20)
BUN: 17.2 mg/dL (ref 7.0–26.0)
CO2: 29 meq/L (ref 22–29)
Calcium: 8.8 mg/dL (ref 8.4–10.4)
Chloride: 103 mEq/L (ref 98–109)
Creatinine: 1.1 mg/dL (ref 0.7–1.3)
EGFR: 73 mL/min/{1.73_m2} — AB (ref >90)
Glucose: 115 mg/dl (ref 70–140)
Potassium: 3.8 mEq/L (ref 3.5–5.1)
Sodium: 140 mEq/L (ref 136–145)
Total Protein: 6.6 g/dL (ref 6.4–8.3)

## 2014-11-23 LAB — CBC WITH DIFFERENTIAL/PLATELET
BASO%: 1 % (ref 0.0–2.0)
Basophils Absolute: 0 10*3/uL (ref 0.0–0.1)
EOS%: 3.1 % (ref 0.0–7.0)
Eosinophils Absolute: 0.1 10*3/uL (ref 0.0–0.5)
HEMATOCRIT: 30.4 % — AB (ref 38.4–49.9)
HEMOGLOBIN: 9.4 g/dL — AB (ref 13.0–17.1)
LYMPH#: 0.7 10*3/uL — AB (ref 0.9–3.3)
LYMPH%: 19.3 % (ref 14.0–49.0)
MCH: 24.3 pg — AB (ref 27.2–33.4)
MCHC: 31 g/dL — ABNORMAL LOW (ref 32.0–36.0)
MCV: 78.3 fL — ABNORMAL LOW (ref 79.3–98.0)
MONO#: 0.5 10*3/uL (ref 0.1–0.9)
MONO%: 13.9 % (ref 0.0–14.0)
NEUT%: 62.7 % (ref 39.0–75.0)
NEUTROS ABS: 2.3 10*3/uL (ref 1.5–6.5)
Platelets: 233 10*3/uL (ref 140–400)
RBC: 3.88 10*6/uL — ABNORMAL LOW (ref 4.20–5.82)
RDW: 18.2 % — AB (ref 11.0–14.6)
WBC: 3.6 10*3/uL — AB (ref 4.0–10.3)

## 2014-11-23 MED ORDER — DIPHENHYDRAMINE HCL 50 MG/ML IJ SOLN
50.0000 mg | Freq: Once | INTRAMUSCULAR | Status: AC
  Administered 2014-11-23: 50 mg via INTRAVENOUS

## 2014-11-23 MED ORDER — PACLITAXEL CHEMO INJECTION 300 MG/50ML
45.0000 mg/m2 | Freq: Once | INTRAVENOUS | Status: AC
  Administered 2014-11-23: 108 mg via INTRAVENOUS
  Filled 2014-11-23: qty 18

## 2014-11-23 MED ORDER — SODIUM CHLORIDE 0.9 % IV SOLN
Freq: Once | INTRAVENOUS | Status: AC
  Administered 2014-11-23: 12:00:00 via INTRAVENOUS
  Filled 2014-11-23: qty 8

## 2014-11-23 MED ORDER — SODIUM CHLORIDE 0.9 % IV SOLN
Freq: Once | INTRAVENOUS | Status: AC
  Administered 2014-11-23: 11:00:00 via INTRAVENOUS

## 2014-11-23 MED ORDER — FAMOTIDINE IN NACL 20-0.9 MG/50ML-% IV SOLN
20.0000 mg | Freq: Once | INTRAVENOUS | Status: AC
Start: 2014-11-23 — End: 2014-11-23
  Administered 2014-11-23: 20 mg via INTRAVENOUS

## 2014-11-23 MED ORDER — FAMOTIDINE IN NACL 20-0.9 MG/50ML-% IV SOLN
INTRAVENOUS | Status: AC
  Filled 2014-11-23: qty 50

## 2014-11-23 MED ORDER — DIPHENHYDRAMINE HCL 50 MG/ML IJ SOLN
INTRAMUSCULAR | Status: AC
  Filled 2014-11-23: qty 1

## 2014-11-23 MED ORDER — SODIUM CHLORIDE 0.9 % IV SOLN
176.4000 mg | Freq: Once | INTRAVENOUS | Status: AC
  Administered 2014-11-23: 180 mg via INTRAVENOUS
  Filled 2014-11-23: qty 18

## 2014-11-23 NOTE — Patient Instructions (Signed)
[facility] Discharge Instructions for Patients Receiving Chemotherapy  Today you received the following chemotherapy agents Taxol and Carboplatin. To help prevent nausea and vomiting after your treatment, we encourage you to take your nausea medication as directed.  If you develop nausea and vomiting that is not controlled by your nausea medication, call the clinic.   BELOW ARE SYMPTOMS THAT SHOULD BE REPORTED IMMEDIATELY:  *FEVER GREATER THAN 100.5 F  *CHILLS WITH OR WITHOUT FEVER  NAUSEA AND VOMITING THAT IS NOT CONTROLLED WITH YOUR NAUSEA MEDICATION  *UNUSUAL SHORTNESS OF BREATH  *UNUSUAL BRUISING OR BLEEDING  TENDERNESS IN MOUTH AND THROAT WITH OR WITHOUT PRESENCE OF ULCERS  *URINARY PROBLEMS  *BOWEL PROBLEMS  UNUSUAL RASH Items with * indicate a potential emergency and should be followed up as soon as possible.  Feel free to call the clinic you have any questions or concerns. The clinic phone number is [phone].  Please show the CHEMO ALERT CARD at check-in to the Emergency Department and triage nurse.    

## 2014-11-24 ENCOUNTER — Ambulatory Visit
Admission: RE | Admit: 2014-11-24 | Discharge: 2014-11-24 | Disposition: A | Discharge status: Home / Self Care | Payer: Medicare Other | Source: Ambulatory Visit | Attending: Radiation Oncology | Admitting: Radiation Oncology

## 2014-11-24 DIAGNOSIS — C3411 Malignant neoplasm of upper lobe, right bronchus or lung: Secondary | ICD-10-CM

## 2014-11-24 DIAGNOSIS — Z87891 Personal history of nicotine dependence: Secondary | ICD-10-CM | POA: Diagnosis not present

## 2014-11-24 MED ORDER — RADIAPLEXRX EX GEL
Freq: Once | CUTANEOUS | Status: AC
  Administered 2014-11-24: 11:00:00 via TOPICAL

## 2014-11-25 ENCOUNTER — Ambulatory Visit
Admission: RE | Admit: 2014-11-25 | Discharge: 2014-11-25 | Disposition: A | Discharge status: Home / Self Care | Payer: Medicare Other | Source: Ambulatory Visit | Attending: Radiation Oncology | Admitting: Radiation Oncology

## 2014-11-25 DIAGNOSIS — C3411 Malignant neoplasm of upper lobe, right bronchus or lung: Secondary | ICD-10-CM | POA: Diagnosis not present

## 2014-11-25 DIAGNOSIS — Z87891 Personal history of nicotine dependence: Secondary | ICD-10-CM | POA: Diagnosis not present

## 2014-11-26 ENCOUNTER — Ambulatory Visit
Admission: RE | Admit: 2014-11-26 | Discharge: 2014-11-26 | Disposition: A | Discharge status: Home / Self Care | Payer: Medicare Other | Source: Ambulatory Visit | Attending: Radiation Oncology | Admitting: Radiation Oncology

## 2014-11-26 ENCOUNTER — Encounter: Payer: Self-pay | Admitting: Internal Medicine

## 2014-11-26 ENCOUNTER — Ambulatory Visit (INDEPENDENT_AMBULATORY_CARE_PROVIDER_SITE_OTHER): Payer: Medicare Other | Admitting: Internal Medicine

## 2014-11-26 VITALS — BP 132/66 | HR 95 | Temp 98.0°F | Ht 71.0 in | Wt 243.3 lb

## 2014-11-26 DIAGNOSIS — E559 Vitamin D deficiency, unspecified: Secondary | ICD-10-CM

## 2014-11-26 DIAGNOSIS — I129 Hypertensive chronic kidney disease with stage 1 through stage 4 chronic kidney disease, or unspecified chronic kidney disease: Secondary | ICD-10-CM | POA: Diagnosis not present

## 2014-11-26 DIAGNOSIS — Z79899 Other long term (current) drug therapy: Secondary | ICD-10-CM

## 2014-11-26 DIAGNOSIS — D509 Iron deficiency anemia, unspecified: Secondary | ICD-10-CM | POA: Diagnosis not present

## 2014-11-26 DIAGNOSIS — M159 Polyosteoarthritis, unspecified: Secondary | ICD-10-CM

## 2014-11-26 DIAGNOSIS — M150 Primary generalized (osteo)arthritis: Secondary | ICD-10-CM | POA: Diagnosis not present

## 2014-11-26 DIAGNOSIS — Z0000 Encounter for general adult medical examination without abnormal findings: Secondary | ICD-10-CM

## 2014-11-26 DIAGNOSIS — E785 Hyperlipidemia, unspecified: Secondary | ICD-10-CM | POA: Diagnosis not present

## 2014-11-26 DIAGNOSIS — D539 Nutritional anemia, unspecified: Secondary | ICD-10-CM | POA: Diagnosis not present

## 2014-11-26 DIAGNOSIS — D631 Anemia in chronic kidney disease: Secondary | ICD-10-CM

## 2014-11-26 DIAGNOSIS — N182 Chronic kidney disease, stage 2 (mild): Secondary | ICD-10-CM | POA: Insufficient documentation

## 2014-11-26 DIAGNOSIS — I10 Essential (primary) hypertension: Secondary | ICD-10-CM

## 2014-11-26 DIAGNOSIS — D649 Anemia, unspecified: Secondary | ICD-10-CM

## 2014-11-26 DIAGNOSIS — C3411 Malignant neoplasm of upper lobe, right bronchus or lung: Secondary | ICD-10-CM | POA: Diagnosis not present

## 2014-11-26 DIAGNOSIS — Z87891 Personal history of nicotine dependence: Secondary | ICD-10-CM | POA: Diagnosis not present

## 2014-11-26 DIAGNOSIS — Z791 Long term (current) use of non-steroidal anti-inflammatories (NSAID): Secondary | ICD-10-CM

## 2014-11-26 MED ORDER — MELOXICAM 7.5 MG PO TABS: ORAL_TABLET | ORAL | Status: DC

## 2014-11-26 MED ORDER — HYDROCHLOROTHIAZIDE 12.5 MG PO CAPS: 12.5000 mg | ORAL_CAPSULE | Freq: Every day | ORAL | Status: DC

## 2014-11-26 MED ORDER — HYDROCODONE-ACETAMINOPHEN 10-325 MG PO TABS: 1.0000 | ORAL_TABLET | Freq: Four times a day (QID) | ORAL | Status: DC | PRN

## 2014-11-26 MED ORDER — CARVEDILOL 25 MG PO TABS: 25.0000 mg | ORAL_TABLET | Freq: Two times a day (BID) | ORAL | Status: DC

## 2014-11-26 MED ORDER — ROSUVASTATIN CALCIUM 10 MG PO TABS: 10.0000 mg | ORAL_TABLET | Freq: Every day | ORAL | Status: DC

## 2014-11-26 MED ORDER — ENALAPRIL MALEATE 20 MG PO TABS: 40.0000 mg | ORAL_TABLET | Freq: Every day | ORAL | Status: DC

## 2014-11-26 MED ORDER — DICLOFENAC SODIUM 1 % TD GEL: 2.0000 g | Freq: Three times a day (TID) | TRANSDERMAL | Status: DC | PRN
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
## 2014-11-26 NOTE — Assessment & Plan Note (Signed)
Assessment: Pt with moderately well-controlled hypertension compliant with three-class (diuretic, BB, and ACEi) anti-hypertensive therapy who presents with blood pressure of 132/66.   Plan:  -BP 132/66 at goal <140/90 -Refill HCTZ 12.5 mg daily, carvedilol 25 mg BID, and enalapril 40 mg daily  -Last CMP on 11/23/14 with stable CKD Stage 2 with Cr 1.1 and GFR 73

## 2014-11-26 NOTE — Assessment & Plan Note (Addendum)
Assessment: Pt with CKD Stage 2 and Stage 3A non-small cell lung cancer on chemo-radiation with normal colonoscopy on 12/31/13 with chronic normocytic to microcytic anemia with baseline Hg 9-10 who presents with no active bleeding or hemodynamic instability.   Plan:  -Last CBC with Hg 9.4 on 11/23/14 at baseline  -Obtain anemia panel to assess iron stores  -Continue integra plus 1 capsule daily per oncology

## 2014-11-26 NOTE — Assessment & Plan Note (Addendum)
Assessment: Pt with non-small cell Stage 3A right upper lobe adenocarcinoma undergoing radiation and weekly taxol/carboplatin since end of June who presents with no complications.   Plan:  -Pt follows with radiation oncologist Dr. Tammi Klippel and oncologist Dr. Julien Nordmann

## 2014-11-26 NOTE — Assessment & Plan Note (Signed)
Assessment: Pt with last lipid panel on 06/26/12 with LDL 68 compliant with high-intensity statin therapy with calculated 10-yr risk >7.5% with patient not in statin benefit group due to age >78 year old.   Plan:  -Obtain lipid panel  -Refill crestor 10 mg daily due to pt preference -Last CMP on 11/23/14 with normal liver function  -Continue to monitor for myalgias

## 2014-11-26 NOTE — Assessment & Plan Note (Addendum)
-  Obtain screening 25-OH vitamin D level    -Pt not candidate for zoster vaccination in setting of immunosuppression  ADDENDUM on 11/30/14: Pt with vitamin D deficiency. Start ergocalciferol 50K U weekly for 8 weeks with repeat levels after completion of therapy.

## 2014-11-26 NOTE — Patient Instructions (Signed)
-  I refilled your medications today  -Will check you bloodwork today and call you with the results -Please come back in 3-6 months for check-up -Very nice meeting you today!  General Instructions:   Please bring your medicines with you each time you come to clinic.  Medicines may include prescription medications, over-the-counter medications, herbal remedies, eye drops, vitamins, or other pills.   Progress Toward Treatment Goals:  Treatment Goal 10/07/2014  Blood pressure unchanged    Self Care Goals & Plans:  Self Care Goal 11/26/2014  Manage my medications take my medicines as prescribed; bring my medications to every visit; refill my medications on time  Monitor my health -  Eat healthy foods (No Data)  Be physically active -  Meeting treatment goals -    No flowsheet data found.   Care Management & Community Referrals:  Referral 09/09/2013  Referrals made for care management support none needed  Referrals made to community resources -

## 2014-11-26 NOTE — Assessment & Plan Note (Signed)
Assessment: Pt with osteoarthritis of multiple joints who presents with well-controlled pain on narcotic and NSAID therapy without inflammatory changes.   Plan:  -Refill norco 10-325 mg every 6 hr PRN # 120 per pain contract, pt given 3 printed prescription to last until 02/26/15 -Refill meloxicam 7.5 mg PRN pain (pt instructed to limit use in setting of CKD Stage 2) -Refill voltaren 1% gel TID PRN pain (pt instructed to limit use in setting of CKD Stage 2)

## 2014-11-27 ENCOUNTER — Ambulatory Visit
Admission: RE | Admit: 2014-11-27 | Discharge: 2014-11-27 | Disposition: A | Discharge status: Home / Self Care | Payer: Medicare Other | Source: Ambulatory Visit | Attending: Radiation Oncology | Admitting: Radiation Oncology

## 2014-11-27 DIAGNOSIS — C3411 Malignant neoplasm of upper lobe, right bronchus or lung: Secondary | ICD-10-CM | POA: Diagnosis not present

## 2014-11-27 DIAGNOSIS — Z87891 Personal history of nicotine dependence: Secondary | ICD-10-CM | POA: Diagnosis not present

## 2014-11-27 LAB — ANEMIA PANEL
%SAT: 7 % — ABNORMAL LOW (ref 20–55)
ABS Retic: 37.2 10*3/uL (ref 19.0–186.0)
FERRITIN: 120 ng/mL (ref 22–322)
Folate: 9.6 ng/mL
IRON: 19 ug/dL — AB (ref 42–165)
RBC.: 3.72 MIL/uL — AB (ref 4.22–5.81)
Retic Ct Pct: 1 % (ref 0.4–2.3)
TIBC: 278 ug/dL (ref 215–435)
UIBC: 259 ug/dL (ref 125–400)
Vitamin B-12: 997 pg/mL — ABNORMAL HIGH (ref 211–911)

## 2014-11-27 LAB — LIPID PANEL
CHOL/HDL RATIO: 3 ratio
Cholesterol: 127 mg/dL (ref 0–200)
HDL: 43 mg/dL (ref >=40)
LDL CALC: 60 mg/dL (ref 0–99)
Triglycerides: 121 mg/dL (ref <150)
VLDL: 24 mg/dL (ref 0–40)

## 2014-11-27 LAB — VITAMIN D 25 HYDROXY (VIT D DEFICIENCY, FRACTURES): VIT D 25 HYDROXY: 6 ng/mL — AB (ref 30–100)

## 2014-11-27 NOTE — Progress Notes (Signed)
Medicine attending: Medical history, presenting problems, physical findings, and medications, reviewed with Dr  a Juluis Mire on the day of the patient visit and I concur with her evaluation and management plan.

## 2014-11-27 NOTE — Progress Notes (Signed)
  Radiation Oncology         [phone]   Name: Nicholas [name] MRN: [id number]   Date: 11/27/2014  DOB: [birth year]     Weekly Radiation Therapy Management    ICD-9-CM ICD-10-CM   1. Cancer of upper lobe of right lung, stage IIIA adenocarcinoma 162.3 C34.11     Current Dose: 36 Gy  Planned Dose:  66 Gy  Narrative The patient presents for routine under treatment assessment. Patient has completed 18 of 33 fractions of radiation to right lung. Denies pain but states he has some numbness of first digit on both hands.No shortness of breath or cough.Skin intact. Mild darkening of skin greater posteriorly.Continue application of gel.Due for labs, Dr.Mohamed and chemotherapy on Monday. Reports intermittent bilateral ulnar distribution. The patient is without complaint. Set-up films were reviewed. The chart was checked.  Physical Findings Weight essentially stable.  No significant changes.  Impression The patient is tolerating radiation.  Plan Continue treatment as planned.   This document serves as a record of services personally performed by Tyler Pita, MD. It was created on his behalf by Arlyce Harman, a trained medical scribe. The creation of this record is based on the scribe's personal observations and the provider's statements to them. This document has been checked and approved by the attending provider.      Sheral Apley Tammi Klippel, M.D.

## 2014-11-27 NOTE — Progress Notes (Signed)
Patient has completed 18 of 33 fractions of radiation to right lung.Denies pain but states he has some numbness of first digit on both hands.No shortness of breath or cough.Skin intact..Mild darkening of skin greater posteriorly.Continue application of gel.Due for labs, Dr.Mohamed and chemotherapy on Monday.

## 2014-11-30 ENCOUNTER — Other Ambulatory Visit: Payer: Medicare Other

## 2014-11-30 ENCOUNTER — Ambulatory Visit (HOSPITAL_BASED_OUTPATIENT_CLINIC_OR_DEPARTMENT_OTHER): Payer: Medicare Other

## 2014-11-30 ENCOUNTER — Ambulatory Visit
Admission: RE | Admit: 2014-11-30 | Discharge: 2014-11-30 | Disposition: A | Discharge status: Home / Self Care | Payer: Medicare Other | Source: Ambulatory Visit | Attending: Radiation Oncology | Admitting: Radiation Oncology

## 2014-11-30 ENCOUNTER — Other Ambulatory Visit (HOSPITAL_BASED_OUTPATIENT_CLINIC_OR_DEPARTMENT_OTHER): Payer: Medicare Other

## 2014-11-30 ENCOUNTER — Encounter: Payer: Self-pay | Admitting: Internal Medicine

## 2014-11-30 ENCOUNTER — Ambulatory Visit (HOSPITAL_BASED_OUTPATIENT_CLINIC_OR_DEPARTMENT_OTHER): Payer: Medicare Other | Admitting: Internal Medicine

## 2014-11-30 ENCOUNTER — Telehealth: Payer: Self-pay | Admitting: Internal Medicine

## 2014-11-30 VITALS — BP 149/69 | HR 67 | Temp 98.4°F | Resp 18 | Ht 71.0 in | Wt 239.1 lb

## 2014-11-30 DIAGNOSIS — Z5111 Encounter for antineoplastic chemotherapy: Secondary | ICD-10-CM

## 2014-11-30 DIAGNOSIS — C3411 Malignant neoplasm of upper lobe, right bronchus or lung: Secondary | ICD-10-CM

## 2014-11-30 DIAGNOSIS — J029 Acute pharyngitis, unspecified: Secondary | ICD-10-CM

## 2014-11-30 DIAGNOSIS — Z87891 Personal history of nicotine dependence: Secondary | ICD-10-CM | POA: Diagnosis not present

## 2014-11-30 DIAGNOSIS — R1310 Dysphagia, unspecified: Secondary | ICD-10-CM | POA: Diagnosis not present

## 2014-11-30 DIAGNOSIS — C3492 Malignant neoplasm of unspecified part of left bronchus or lung: Secondary | ICD-10-CM

## 2014-11-30 DIAGNOSIS — E559 Vitamin D deficiency, unspecified: Secondary | ICD-10-CM | POA: Insufficient documentation

## 2014-11-30 LAB — CBC WITH DIFFERENTIAL/PLATELET
BASO%: 2.2 % — ABNORMAL HIGH (ref 0.0–2.0)
Basophils Absolute: 0.1 10*3/uL (ref 0.0–0.1)
EOS ABS: 0.1 10*3/uL (ref 0.0–0.5)
EOS%: 2.3 % (ref 0.0–7.0)
HCT: 28.4 % — ABNORMAL LOW (ref 38.4–49.9)
HEMOGLOBIN: 9 g/dL — AB (ref 13.0–17.1)
LYMPH#: 0.6 10*3/uL — AB (ref 0.9–3.3)
LYMPH%: 22.6 % (ref 14.0–49.0)
MCH: 24.9 pg — ABNORMAL LOW (ref 27.2–33.4)
MCHC: 31.8 g/dL — ABNORMAL LOW (ref 32.0–36.0)
MCV: 78.1 fL — ABNORMAL LOW (ref 79.3–98.0)
MONO#: 0.4 10*3/uL (ref 0.1–0.9)
MONO%: 14.4 % — AB (ref 0.0–14.0)
NEUT#: 1.5 10*3/uL (ref 1.5–6.5)
NEUT%: 58.5 % (ref 39.0–75.0)
PLATELETS: 219 10*3/uL (ref 140–400)
RBC: 3.64 10*6/uL — AB (ref 4.20–5.82)
RDW: 19.5 % — ABNORMAL HIGH (ref 11.0–14.6)
WBC: 2.5 10*3/uL — ABNORMAL LOW (ref 4.0–10.3)

## 2014-11-30 LAB — COMPREHENSIVE METABOLIC PANEL (CC13)
ALBUMIN: 2.7 g/dL — AB (ref 3.5–5.0)
ALT: 9 U/L (ref 0–55)
AST: 12 U/L (ref 5–34)
Alkaline Phosphatase: 74 U/L (ref 40–150)
Anion Gap: 7 mEq/L (ref 3–11)
BUN: 12.2 mg/dL (ref 7.0–26.0)
CO2: 29 meq/L (ref 22–29)
Calcium: 8.3 mg/dL — ABNORMAL LOW (ref 8.4–10.4)
Chloride: 105 mEq/L (ref 98–109)
Creatinine: 1 mg/dL (ref 0.7–1.3)
EGFR: 79 mL/min/{1.73_m2} — ABNORMAL LOW (ref >90)
GLUCOSE: 113 mg/dL (ref 70–140)
POTASSIUM: 4.1 meq/L (ref 3.5–5.1)
SODIUM: 141 meq/L (ref 136–145)
TOTAL PROTEIN: 6.5 g/dL (ref 6.4–8.3)
Total Bilirubin: 0.35 mg/dL (ref 0.20–1.20)

## 2014-11-30 MED ORDER — SODIUM CHLORIDE 0.9 % IV SOLN
Freq: Once | INTRAVENOUS | Status: AC
  Administered 2014-11-30: 13:00:00 via INTRAVENOUS

## 2014-11-30 MED ORDER — DIPHENHYDRAMINE HCL 50 MG/ML IJ SOLN
50.0000 mg | Freq: Once | INTRAMUSCULAR | Status: AC
  Administered 2014-11-30: 50 mg via INTRAVENOUS

## 2014-11-30 MED ORDER — SODIUM CHLORIDE 0.9 % IV SOLN
Freq: Once | INTRAVENOUS | Status: AC
  Administered 2014-11-30: 13:00:00 via INTRAVENOUS
  Filled 2014-11-30: qty 8

## 2014-11-30 MED ORDER — SUCRALFATE 1 GM/10ML PO SUSP: 1.0000 g | Freq: Three times a day (TID) | ORAL | Status: DC

## 2014-11-30 MED ORDER — PROCHLORPERAZINE MALEATE 10 MG PO TABS: 10.0000 mg | ORAL_TABLET | Freq: Four times a day (QID) | ORAL | Status: DC | PRN

## 2014-11-30 MED ORDER — FAMOTIDINE IN NACL 20-0.9 MG/50ML-% IV SOLN
INTRAVENOUS | Status: AC
Start: 2014-11-30 — End: 2014-11-30
  Filled 2014-11-30: qty 50

## 2014-11-30 MED ORDER — PACLITAXEL CHEMO INJECTION 300 MG/50ML
45.0000 mg/m2 | Freq: Once | INTRAVENOUS | Status: AC
  Administered 2014-11-30: 108 mg via INTRAVENOUS
  Filled 2014-11-30: qty 18

## 2014-11-30 MED ORDER — VITAMIN D (ERGOCALCIFEROL) 1.25 MG (50000 UNIT) PO CAPS: 50000.0000 [IU] | ORAL_CAPSULE | ORAL | Status: DC

## 2014-11-30 MED ORDER — DIPHENHYDRAMINE HCL 50 MG/ML IJ SOLN
INTRAMUSCULAR | Status: AC
  Filled 2014-11-30: qty 1

## 2014-11-30 MED ORDER — SODIUM CHLORIDE 0.9 % IV SOLN
239.6000 mg | Freq: Once | INTRAVENOUS | Status: AC
  Administered 2014-11-30: 240 mg via INTRAVENOUS
  Filled 2014-11-30: qty 24

## 2014-11-30 MED ORDER — FAMOTIDINE IN NACL 20-0.9 MG/50ML-% IV SOLN
20.0000 mg | Freq: Once | INTRAVENOUS | Status: AC
  Administered 2014-11-30: 20 mg via INTRAVENOUS

## 2014-11-30 NOTE — Patient Instructions (Signed)
[facility] Discharge Instructions for Patients Receiving Chemotherapy  Today you received the following chemotherapy agents Taxol and Carboplatin. To help prevent nausea and vomiting after your treatment, we encourage you to take your nausea medication as directed.  If you develop nausea and vomiting that is not controlled by your nausea medication, call the clinic.   BELOW ARE SYMPTOMS THAT SHOULD BE REPORTED IMMEDIATELY:  *FEVER GREATER THAN 100.5 F  *CHILLS WITH OR WITHOUT FEVER  NAUSEA AND VOMITING THAT IS NOT CONTROLLED WITH YOUR NAUSEA MEDICATION  *UNUSUAL SHORTNESS OF BREATH  *UNUSUAL BRUISING OR BLEEDING  TENDERNESS IN MOUTH AND THROAT WITH OR WITHOUT PRESENCE OF ULCERS  *URINARY PROBLEMS  *BOWEL PROBLEMS  UNUSUAL RASH Items with * indicate a potential emergency and should be followed up as soon as possible.  Feel free to call the clinic you have any questions or concerns. The clinic phone number is [phone].  Please show the CHEMO ALERT CARD at check-in to the Emergency Department and triage nurse.    

## 2014-11-30 NOTE — Addendum Note (Signed)
Addended byJuluis Mire on: 11/30/2014 09:27 AM   Modules accepted: Orders

## 2014-11-30 NOTE — Telephone Encounter (Signed)
per pof to sch pt appt-gave pt copy of avs-trmt already sch

## 2014-11-30 NOTE — Assessment & Plan Note (Signed)
Assessment: Pt with vitamin D deficiency with level of 6 on 11/26/14 who presents with no recent fracture or fall.   Plan: -Prescribe ergocalciferol 50K U weekly for 8 weeks  -Repeat 25-OH vitamin D level after completion of therapy
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
## 2014-11-30 NOTE — Progress Notes (Signed)
OK to treat with ANC 1.5 per Dr. Julien Nordmann.

## 2014-12-01 ENCOUNTER — Ambulatory Visit
Admission: RE | Admit: 2014-12-01 | Discharge: 2014-12-01 | Disposition: A | Discharge status: Home / Self Care | Payer: Medicare Other | Source: Ambulatory Visit | Attending: Radiation Oncology | Admitting: Radiation Oncology

## 2014-12-01 DIAGNOSIS — C3411 Malignant neoplasm of upper lobe, right bronchus or lung: Secondary | ICD-10-CM | POA: Diagnosis not present

## 2014-12-01 DIAGNOSIS — Z87891 Personal history of nicotine dependence: Secondary | ICD-10-CM | POA: Diagnosis not present

## 2014-12-02 ENCOUNTER — Ambulatory Visit
Admission: RE | Admit: 2014-12-02 | Discharge: 2014-12-02 | Disposition: A | Discharge status: Home / Self Care | Payer: Medicare Other | Source: Ambulatory Visit | Attending: Radiation Oncology | Admitting: Radiation Oncology

## 2014-12-02 DIAGNOSIS — C3411 Malignant neoplasm of upper lobe, right bronchus or lung: Secondary | ICD-10-CM | POA: Diagnosis not present

## 2014-12-02 DIAGNOSIS — Z87891 Personal history of nicotine dependence: Secondary | ICD-10-CM | POA: Diagnosis not present

## 2014-12-03 ENCOUNTER — Ambulatory Visit
Admission: RE | Admit: 2014-12-03 | Discharge: 2014-12-03 | Disposition: A | Discharge status: Home / Self Care | Payer: Medicare Other | Source: Ambulatory Visit | Attending: Radiation Oncology | Admitting: Radiation Oncology

## 2014-12-03 VITALS — BP 111/69 | HR 56 | Temp 98.2°F

## 2014-12-03 DIAGNOSIS — C3411 Malignant neoplasm of upper lobe, right bronchus or lung: Secondary | ICD-10-CM | POA: Diagnosis not present

## 2014-12-03 DIAGNOSIS — Z87891 Personal history of nicotine dependence: Secondary | ICD-10-CM | POA: Diagnosis not present

## 2014-12-03 NOTE — Progress Notes (Signed)
Weekly assessment of radiation to right lung, 22 of 33 treatments.Mild discoloration in mid posterior back.Has nagging sore throat.Patient started on carafate on 11/30/14.He was taking wrong states he was taking after meals.instructed to take prior to meals and at bedtime.

## 2014-12-03 NOTE — Progress Notes (Signed)
  Radiation Oncology         [phone]   Name: Nicholas [name] MRN: [id number]   Date: 12/03/2014  DOB: 08-11-1934     Weekly Radiation Therapy Management  Diagnosis: Nicholas [name] is a 80 year old male presenting to clinic in regards to his cancer of upper lobe of the right lung (stage IIIA).  Current Dose: 44 Gy  Planned Dose:  66 Gy  Narrative The patient presents for routine under treatment assessment with 22 of 33 treatments completed at this time. Mild discoloration in mid posterior back was noted at today's visit. He reported a sore throat, but has started administering Carafate (starting 11/30/2014). Improper administration of medication, has been corrected via verbal instruction from the nursing staff and MD. Set-up films were reviewed and the chart was checked.  Physical Findings His weight is essentially stable with no significant changes in the status of overall health to be noted at this time.  Impression Nicholas [name] is a 80 year old male presenting to clinic in regards to his cancer of upper lobe of the right lung (stage IIIA). The patient is tolerating radiation.  Plan All questions vocalized by the patient were fully addressed. Common symptoms to expect at this time in the patient's recovery were discussed and reviewed. Healthy methods to manage these symptoms if they are to occur were addressed. The patient was advised to continue treatment as planned and advised of his follow up appointment with radiation oncology to take place next week. He was instructed to continue administering Carafate, but instructions were corrected for proper use. If he develops any further questions or concerns in regards to his treatment and recovery, he has been encouraged to contact Dr. Tammi Klippel, MD.   This document serves as a record of services personally performed by Tyler Pita, MD. It was created on his behalf by Lenn Cal, a trained medical scribe. The creation of this record is  based on the scribe's personal observations and the provider's statements to them. This document has been checked and approved by the attending provider.  ______________________________________________     Sheral Apley. Tammi Klippel, M.D.

## 2014-12-04 ENCOUNTER — Ambulatory Visit
Admission: RE | Admit: 2014-12-04 | Discharge: 2014-12-04 | Disposition: A | Discharge status: Home / Self Care | Payer: Medicare Other | Source: Ambulatory Visit | Attending: Radiation Oncology | Admitting: Radiation Oncology

## 2014-12-04 DIAGNOSIS — C3411 Malignant neoplasm of upper lobe, right bronchus or lung: Secondary | ICD-10-CM | POA: Diagnosis not present

## 2014-12-04 DIAGNOSIS — Z87891 Personal history of nicotine dependence: Secondary | ICD-10-CM | POA: Diagnosis not present

## 2014-12-07 ENCOUNTER — Other Ambulatory Visit (HOSPITAL_BASED_OUTPATIENT_CLINIC_OR_DEPARTMENT_OTHER): Payer: Medicare Other

## 2014-12-07 ENCOUNTER — Ambulatory Visit (HOSPITAL_BASED_OUTPATIENT_CLINIC_OR_DEPARTMENT_OTHER): Payer: Medicare Other

## 2014-12-07 ENCOUNTER — Ambulatory Visit
Admission: RE | Admit: 2014-12-07 | Discharge: 2014-12-07 | Disposition: A | Discharge status: Home / Self Care | Payer: Medicare Other | Source: Ambulatory Visit | Attending: Radiation Oncology | Admitting: Radiation Oncology

## 2014-12-07 VITALS — BP 119/62 | HR 65 | Temp 98.5°F | Resp 16

## 2014-12-07 DIAGNOSIS — C3411 Malignant neoplasm of upper lobe, right bronchus or lung: Secondary | ICD-10-CM | POA: Diagnosis not present

## 2014-12-07 DIAGNOSIS — Z5111 Encounter for antineoplastic chemotherapy: Secondary | ICD-10-CM | POA: Diagnosis not present

## 2014-12-07 DIAGNOSIS — C3492 Malignant neoplasm of unspecified part of left bronchus or lung: Secondary | ICD-10-CM

## 2014-12-07 DIAGNOSIS — Z87891 Personal history of nicotine dependence: Secondary | ICD-10-CM | POA: Diagnosis not present

## 2014-12-07 LAB — COMPREHENSIVE METABOLIC PANEL (CC13)
ALBUMIN: 2.7 g/dL — AB (ref 3.5–5.0)
ALT: 11 U/L (ref 0–55)
AST: 12 U/L (ref 5–34)
Alkaline Phosphatase: 68 U/L (ref 40–150)
Anion Gap: 5 mEq/L (ref 3–11)
BUN: 17.7 mg/dL (ref 7.0–26.0)
CALCIUM: 8.1 mg/dL — AB (ref 8.4–10.4)
CO2: 28 meq/L (ref 22–29)
Chloride: 107 mEq/L (ref 98–109)
Creatinine: 1.2 mg/dL (ref 0.7–1.3)
EGFR: 69 mL/min/{1.73_m2} — ABNORMAL LOW (ref >90)
Glucose: 95 mg/dl (ref 70–140)
Potassium: 4 mEq/L (ref 3.5–5.1)
Sodium: 140 mEq/L (ref 136–145)
Total Bilirubin: 0.21 mg/dL (ref 0.20–1.20)
Total Protein: 6 g/dL — ABNORMAL LOW (ref 6.4–8.3)

## 2014-12-07 LAB — CBC WITH DIFFERENTIAL/PLATELET
BASO%: 1.3 % (ref 0.0–2.0)
Basophils Absolute: 0 10*3/uL (ref 0.0–0.1)
EOS%: 2.5 % (ref 0.0–7.0)
Eosinophils Absolute: 0.1 10*3/uL (ref 0.0–0.5)
HEMATOCRIT: 27.3 % — AB (ref 38.4–49.9)
HGB: 8.6 g/dL — ABNORMAL LOW (ref 13.0–17.1)
LYMPH%: 15.9 % (ref 14.0–49.0)
MCH: 25.1 pg — ABNORMAL LOW (ref 27.2–33.4)
MCHC: 31.6 g/dL — AB (ref 32.0–36.0)
MCV: 79.4 fL (ref 79.3–98.0)
MONO#: 0.5 10*3/uL (ref 0.1–0.9)
MONO%: 15.1 % — AB (ref 0.0–14.0)
NEUT%: 65.2 % (ref 39.0–75.0)
NEUTROS ABS: 2.2 10*3/uL (ref 1.5–6.5)
PLATELETS: 209 10*3/uL (ref 140–400)
RBC: 3.43 10*6/uL — ABNORMAL LOW (ref 4.20–5.82)
RDW: 20.1 % — ABNORMAL HIGH (ref 11.0–14.6)
WBC: 3.4 10*3/uL — ABNORMAL LOW (ref 4.0–10.3)
lymph#: 0.5 10*3/uL — ABNORMAL LOW (ref 0.9–3.3)

## 2014-12-07 MED ORDER — DIPHENHYDRAMINE HCL 50 MG/ML IJ SOLN
50.0000 mg | Freq: Once | INTRAMUSCULAR | Status: AC
  Administered 2014-12-07: 50 mg via INTRAVENOUS

## 2014-12-07 MED ORDER — SODIUM CHLORIDE 0.9 % IV SOLN
Freq: Once | INTRAVENOUS | Status: AC
  Administered 2014-12-07: 12:00:00 via INTRAVENOUS

## 2014-12-07 MED ORDER — FAMOTIDINE IN NACL 20-0.9 MG/50ML-% IV SOLN
INTRAVENOUS | Status: AC
  Filled 2014-12-07: qty 50

## 2014-12-07 MED ORDER — SODIUM CHLORIDE 0.9 % IV SOLN
Freq: Once | INTRAVENOUS | Status: AC
  Administered 2014-12-07: 12:00:00 via INTRAVENOUS
  Filled 2014-12-07: qty 8

## 2014-12-07 MED ORDER — DIPHENHYDRAMINE HCL 50 MG/ML IJ SOLN
INTRAMUSCULAR | Status: AC
  Filled 2014-12-07: qty 1

## 2014-12-07 MED ORDER — SODIUM CHLORIDE 0.9 % IV SOLN
200.0000 mg | Freq: Once | INTRAVENOUS | Status: AC
  Administered 2014-12-07: 200 mg via INTRAVENOUS
  Filled 2014-12-07: qty 20

## 2014-12-07 MED ORDER — FAMOTIDINE IN NACL 20-0.9 MG/50ML-% IV SOLN
20.0000 mg | Freq: Once | INTRAVENOUS | Status: AC
  Administered 2014-12-07: 20 mg via INTRAVENOUS

## 2014-12-07 MED ORDER — PACLITAXEL CHEMO INJECTION 300 MG/50ML
45.0000 mg/m2 | Freq: Once | INTRAVENOUS | Status: AC
  Administered 2014-12-07: 108 mg via INTRAVENOUS
  Filled 2014-12-07: qty 18

## 2014-12-07 NOTE — Patient Instructions (Signed)
[facility] Discharge Instructions for Patients Receiving Chemotherapy  Today you received the following chemotherapy agents taxol and carboplatin.  To help prevent nausea and vomiting after your treatment, we encourage you to take your nausea medication as needed.   If you develop nausea and vomiting that is not controlled by your nausea medication, call the clinic.   BELOW ARE SYMPTOMS THAT SHOULD BE REPORTED IMMEDIATELY:  *FEVER GREATER THAN 100.5 F  *CHILLS WITH OR WITHOUT FEVER  NAUSEA AND VOMITING THAT IS NOT CONTROLLED WITH YOUR NAUSEA MEDICATION  *UNUSUAL SHORTNESS OF BREATH  *UNUSUAL BRUISING OR BLEEDING  TENDERNESS IN MOUTH AND THROAT WITH OR WITHOUT PRESENCE OF ULCERS  *URINARY PROBLEMS  *BOWEL PROBLEMS  UNUSUAL RASH Items with * indicate a potential emergency and should be followed up as soon as possible.  Feel free to call the clinic you have any questions or concerns. The clinic phone number is [phone].  Please show the [facility] at check-in to the Emergency Department and triage nurse.

## 2014-12-08 ENCOUNTER — Ambulatory Visit
Admission: RE | Admit: 2014-12-08 | Discharge: 2014-12-08 | Disposition: A | Discharge status: Home / Self Care | Payer: Medicare Other | Source: Ambulatory Visit | Attending: Radiation Oncology | Admitting: Radiation Oncology

## 2014-12-08 DIAGNOSIS — C3411 Malignant neoplasm of upper lobe, right bronchus or lung: Secondary | ICD-10-CM | POA: Diagnosis not present

## 2014-12-08 DIAGNOSIS — Z87891 Personal history of nicotine dependence: Secondary | ICD-10-CM | POA: Diagnosis not present

## 2014-12-09 ENCOUNTER — Ambulatory Visit
Admission: RE | Admit: 2014-12-09 | Discharge: 2014-12-09 | Disposition: A | Discharge status: Home / Self Care | Payer: Medicare Other | Source: Ambulatory Visit | Attending: Radiation Oncology | Admitting: Radiation Oncology

## 2014-12-09 DIAGNOSIS — Z87891 Personal history of nicotine dependence: Secondary | ICD-10-CM | POA: Diagnosis not present

## 2014-12-09 DIAGNOSIS — C3411 Malignant neoplasm of upper lobe, right bronchus or lung: Secondary | ICD-10-CM | POA: Diagnosis not present

## 2014-12-10 ENCOUNTER — Ambulatory Visit
Admission: RE | Admit: 2014-12-10 | Discharge: 2014-12-10 | Disposition: A | Discharge status: Home / Self Care | Payer: Medicare Other | Source: Ambulatory Visit | Attending: Radiation Oncology | Admitting: Radiation Oncology

## 2014-12-10 DIAGNOSIS — C3411 Malignant neoplasm of upper lobe, right bronchus or lung: Secondary | ICD-10-CM | POA: Diagnosis not present

## 2014-12-10 DIAGNOSIS — Z87891 Personal history of nicotine dependence: Secondary | ICD-10-CM | POA: Diagnosis not present

## 2014-12-11 ENCOUNTER — Other Ambulatory Visit: Payer: Medicare Other

## 2014-12-11 ENCOUNTER — Other Ambulatory Visit: Payer: Self-pay | Admitting: Internal Medicine

## 2014-12-11 ENCOUNTER — Ambulatory Visit (HOSPITAL_BASED_OUTPATIENT_CLINIC_OR_DEPARTMENT_OTHER): Payer: Medicare Other | Admitting: Nurse Practitioner

## 2014-12-11 ENCOUNTER — Ambulatory Visit
Admission: RE | Admit: 2014-12-11 | Discharge: 2014-12-11 | Disposition: A | Discharge status: Home / Self Care | Payer: Medicare Other | Source: Ambulatory Visit | Attending: Radiation Oncology | Admitting: Radiation Oncology

## 2014-12-11 ENCOUNTER — Other Ambulatory Visit (HOSPITAL_BASED_OUTPATIENT_CLINIC_OR_DEPARTMENT_OTHER): Payer: Medicare Other

## 2014-12-11 ENCOUNTER — Encounter: Payer: Self-pay | Admitting: Radiation Oncology

## 2014-12-11 VITALS — BP 113/52 | HR 78 | Temp 99.0°F | Resp 18 | Ht 71.0 in | Wt 238.6 lb

## 2014-12-11 VITALS — BP 122/65 | HR 73 | Resp 16 | Wt 234.9 lb

## 2014-12-11 DIAGNOSIS — Z87891 Personal history of nicotine dependence: Secondary | ICD-10-CM | POA: Insufficient documentation

## 2014-12-11 DIAGNOSIS — C3411 Malignant neoplasm of upper lobe, right bronchus or lung: Secondary | ICD-10-CM

## 2014-12-11 DIAGNOSIS — R1310 Dysphagia, unspecified: Secondary | ICD-10-CM

## 2014-12-11 DIAGNOSIS — C3492 Malignant neoplasm of unspecified part of left bronchus or lung: Secondary | ICD-10-CM

## 2014-12-11 LAB — CBC WITH DIFFERENTIAL/PLATELET
BASO%: 0.4 % (ref 0.0–2.0)
BASOS ABS: 0 10*3/uL (ref 0.0–0.1)
EOS ABS: 0 10*3/uL (ref 0.0–0.5)
EOS%: 0.8 % (ref 0.0–7.0)
HCT: 29.7 % — ABNORMAL LOW (ref 38.4–49.9)
HGB: 9.4 g/dL — ABNORMAL LOW (ref 13.0–17.1)
LYMPH%: 8.3 % — ABNORMAL LOW (ref 14.0–49.0)
MCH: 25.3 pg — ABNORMAL LOW (ref 27.2–33.4)
MCHC: 31.8 g/dL — ABNORMAL LOW (ref 32.0–36.0)
MCV: 79.5 fL (ref 79.3–98.0)
MONO#: 0.9 10*3/uL (ref 0.1–0.9)
MONO%: 14.3 % — AB (ref 0.0–14.0)
NEUT%: 76.2 % — AB (ref 39.0–75.0)
NEUTROS ABS: 4.6 10*3/uL (ref 1.5–6.5)
Platelets: 211 10*3/uL (ref 140–400)
RBC: 3.74 10*6/uL — AB (ref 4.20–5.82)
RDW: 22.2 % — ABNORMAL HIGH (ref 11.0–14.6)
WBC: 6 10*3/uL (ref 4.0–10.3)
lymph#: 0.5 10*3/uL — ABNORMAL LOW (ref 0.9–3.3)

## 2014-12-11 LAB — COMPREHENSIVE METABOLIC PANEL (CC13)
ALT: 10 U/L (ref 0–55)
ANION GAP: 6 meq/L (ref 3–11)
AST: 10 U/L (ref 5–34)
Albumin: 2.9 g/dL — ABNORMAL LOW (ref 3.5–5.0)
Alkaline Phosphatase: 60 U/L (ref 40–150)
BUN: 16.4 mg/dL (ref 7.0–26.0)
CALCIUM: 8.3 mg/dL — AB (ref 8.4–10.4)
CHLORIDE: 104 meq/L (ref 98–109)
CO2: 30 mEq/L — ABNORMAL HIGH (ref 22–29)
Creatinine: 1.1 mg/dL (ref 0.7–1.3)
EGFR: 72 mL/min/{1.73_m2} — AB (ref >90)
Glucose: 145 mg/dl — ABNORMAL HIGH (ref 70–140)
Potassium: 3.8 mEq/L (ref 3.5–5.1)
SODIUM: 140 meq/L (ref 136–145)
Total Bilirubin: 0.46 mg/dL (ref 0.20–1.20)
Total Protein: 5.9 g/dL — ABNORMAL LOW (ref 6.4–8.3)

## 2014-12-11 MED ORDER — RADIAPLEXRX EX GEL
Freq: Once | CUTANEOUS | Status: AC
  Administered 2014-12-11: 14:00:00 via TOPICAL

## 2014-12-11 MED ORDER — SUCRALFATE 1 GM/10ML PO SUSP
1.0000 g | Freq: Three times a day (TID) | ORAL | Status: DC
Start: 2014-12-11 — End: 2014-12-23

## 2014-12-11 NOTE — Progress Notes (Signed)
Department of Radiation Oncology  Phone:  [phone] Fax:        [phone]  Weekly Treatment Note    Name: Nicholas [name] Date: 12/11/2014 MRN: [id number] DOB: 1935-02-02   Current dose: 56 Gy  Current fraction: 28   MEDICATIONS: Current Outpatient Prescriptions  Medication Sig Dispense Refill  . carvedilol (COREG) 25 MG tablet Take 1 tablet (25 mg total) by mouth 2 (two) times daily with a meal. 180 tablet 3  . diclofenac sodium (VOLTAREN) 1 % GEL Apply 2 g topically 3 (three) times daily as needed. 200 g 6  . enalapril (VASOTEC) 20 MG tablet Take 2 tablets (40 mg total) by mouth daily. 90 tablet 3  . FeFum-FePoly-FA-B Cmp-C-Biot (INTEGRA PLUS) CAPS Take 1 capsule by mouth daily. 30 capsule 3  . gabapentin (NEURONTIN) 600 MG tablet Take 1 tablet (600 mg total) by mouth 2 (two) times daily. 180 tablet 3  . hydrochlorothiazide (MICROZIDE) 12.5 MG capsule Take 1 capsule (12.5 mg total) by mouth daily. 90 capsule 3  . HYDROcodone-acetaminophen (NORCO) 10-325 MG per tablet Take 1 tablet by mouth every 6 (six) hours as needed for moderate pain or severe pain. 120 tablet 0  . meloxicam (MOBIC) 7.5 MG tablet TAKE 1 TABLET (7.5 MG TOTAL) BY MOUTH DAILY AS NEEDED FOR PAIN. 90 tablet 3  . pantoprazole (PROTONIX) 20 MG tablet TAKE 1 TABLET (20 MG TOTAL) BY MOUTH DAILY.  0  . pantoprazole (PROTONIX) 20 MG tablet TAKE 1 TABLET (20 MG TOTAL) BY MOUTH DAILY. 90 tablet 0  . prochlorperazine (COMPAZINE) 10 MG tablet Take 1 tablet (10 mg total) by mouth every 6 (six) hours as needed for nausea or vomiting. 30 tablet 0  . rosuvastatin (CRESTOR) 10 MG tablet Take 1 tablet (10 mg total) by mouth daily. 90 tablet 3  . sucralfate (CARAFATE) 1 GM/10ML suspension Take 10 mLs (1 g total) by mouth 4 (four) times daily -  with meals and at bedtime. 420 mL 0  . triamcinolone cream (KENALOG) 0.1 % Apply 1 application topically daily as needed (itching). 80 g 5  . Vitamin D, Ergocalciferol, (DRISDOL)  50000 UNITS CAPS capsule Take 1 capsule (50,000 Units total) by mouth every 7 (seven) days. 8 capsule 0   No current facility-administered medications for this encounter.     ALLERGIES: Amlodipine   LABORATORY DATA:  Lab Results  Component Value Date   WBC 6.0 12/11/2014   HGB 9.4* 12/11/2014   HCT 29.7* 12/11/2014   MCV 79.5 12/11/2014   PLT 211 12/11/2014   Lab Results  Component Value Date   NA 140 12/11/2014   K 3.8 12/11/2014   CL 101 09/02/2014   CO2 30* 12/11/2014   Lab Results  Component Value Date   ALT 10 12/11/2014   AST 10 12/11/2014   ALKPHOS 60 12/11/2014   BILITOT 0.46 12/11/2014     NARRATIVE: Karl Bales was seen today for weekly treatment management. The chart was checked and the patient's films were reviewed.  Weight and vitals stable. Hyperpigmentation without desquamation within treatment field. Reports using radiaplex bid as directed. Reports a persistent sore throat. Reports he eats soup mostly. Reports productive cough is less.   PHYSICAL EXAMINATION: weight is 234 lb 14.4 oz (106.55 kg). His blood pressure is 122/65 and his pulse is 73. His respiration is 16.       Skin: Mild hyperpigmentation on back/treatment site.  ASSESSMENT: The patient is doing satisfactorily with treatment.  PLAN: We will continue  with the patient's radiation treatment as planned.     ------------------------------------------------  Jodelle Gross, MD, PhD  This document serves as a record of services personally performed by Kyung Rudd, MD. It was created on his behalf by Derek Mound, a trained medical scribe. The creation of this record is based on the scribe's personal observations and the provider's statements to them. This document has been checked and approved by the attending provider.

## 2014-12-11 NOTE — Progress Notes (Signed)
Weight and vitals stable. Hyperpigmentation without desquamation within treatment field. Reports using radiaplex bid as directed. Reports a persistent sore throat. Reports he eats soap mostly. Reports productive cough is less.   BP 122/65 mmHg  Pulse 73  Resp 16  Wt 234 lb 14.4 oz (106.55 kg) Wt Readings from Last 3 Encounters:  12/11/14 234 lb 14.4 oz (106.55 kg)  12/11/14 238 lb 9.6 oz (108.228 kg)  11/30/14 239 lb 1.6 oz (108.455 kg)

## 2014-12-11 NOTE — Progress Notes (Signed)
  [facility] OFFICE PROGRESS NOTE   DIAGNOSIS: Stage IIIA (T2a, N2, M0) non-small cell lung cancer, adenocarcinoma diagnosed in May 2016.  PRIOR THERAPY: None  CURRENT THERAPY: Concurrent chemoradiation with weekly carboplatin for AUC of 2 and paclitaxel 45 MG/M2 status post 5 cycles.   INTERVAL HISTORY:   Nicholas [name] returns as scheduled. He completed week 6 carboplatin/Taxol 12/07/2014. He continues radiation. He denies nausea/vomiting. No mouth sores. He occasionally notes numbness in the bilateral pinky fingers. He has occasional loose stools. He has mild odynophagia. He is taking Carafate. He is tolerating liquids without difficulty. He denies shortness of breath.  Objective:  Vital signs in last 24 hours:  Blood pressure 113/52, pulse 78, temperature 99 F (37.2 C), temperature source Oral, resp. rate 18, height '5\' 11"'$  (1.803 m), weight 238 lb 9.6 oz (108.228 kg), SpO2 99 %.    HEENT: No thrush or ulcers. Mucus membranes are moist. Resp: Lungs clear bilaterally. Cardio: Regular rate and rhythm. GI: Abdomen soft and nontender. No hepatomegaly. Vascular: Trace lower leg edema bilaterally. Calves nontender.     Lab Results:  Lab Results  Component Value Date   WBC 6.0 12/11/2014   HGB 9.4* 12/11/2014   HCT 29.7* 12/11/2014   MCV 79.5 12/11/2014   PLT 211 12/11/2014   NEUTROABS 4.6 12/11/2014    Imaging:  No results found.  Medications: I have reviewed the patient's current medications.  Assessment/Plan: 1. Stage IIIa non-small cell lung cancer currently undergoing concurrent chemoradiation with weekly carboplatin and paclitaxel status post 5 cycles. 2. Odynophagia secondary to radiation.   Disposition: Nicholas [name] appears stable. He continues radiation and weekly carboplatin/Taxol. He has mild odynophagia. He is utilizing Carafate. He has hydrocodone to take as needed. He understands to contact the office with poor pain control or if he is unable to  swallow.  He will complete the course of radiation on 12/18/2014. We will refer him for a follow-up CT scan approximately 4 weeks after completion. He will return for a follow-up visit within a few days of the scan to review the results.    Ned Card ANP/GNP-BC   12/11/2014  10:33 AM

## 2014-12-14 ENCOUNTER — Other Ambulatory Visit: Payer: Medicare Other

## 2014-12-14 ENCOUNTER — Ambulatory Visit
Admission: RE | Admit: 2014-12-14 | Discharge: 2014-12-14 | Disposition: A | Discharge status: Home / Self Care | Payer: Medicare Other | Source: Ambulatory Visit | Attending: Radiation Oncology | Admitting: Radiation Oncology

## 2014-12-14 ENCOUNTER — Ambulatory Visit (HOSPITAL_BASED_OUTPATIENT_CLINIC_OR_DEPARTMENT_OTHER): Payer: Medicare Other

## 2014-12-14 VITALS — BP 140/63 | HR 64 | Temp 98.3°F | Resp 18

## 2014-12-14 DIAGNOSIS — Z5111 Encounter for antineoplastic chemotherapy: Secondary | ICD-10-CM | POA: Diagnosis not present

## 2014-12-14 DIAGNOSIS — Z87891 Personal history of nicotine dependence: Secondary | ICD-10-CM | POA: Diagnosis not present

## 2014-12-14 DIAGNOSIS — C3411 Malignant neoplasm of upper lobe, right bronchus or lung: Secondary | ICD-10-CM | POA: Diagnosis not present

## 2014-12-14 DIAGNOSIS — C3492 Malignant neoplasm of unspecified part of left bronchus or lung: Secondary | ICD-10-CM

## 2014-12-14 MED ORDER — SODIUM CHLORIDE 0.9 % IV SOLN
Freq: Once | INTRAVENOUS | Status: AC
  Administered 2014-12-14: 12:00:00 via INTRAVENOUS

## 2014-12-14 MED ORDER — FAMOTIDINE IN NACL 20-0.9 MG/50ML-% IV SOLN
20.0000 mg | Freq: Once | INTRAVENOUS | Status: AC
  Administered 2014-12-14: 20 mg via INTRAVENOUS

## 2014-12-14 MED ORDER — DIPHENHYDRAMINE HCL 50 MG/ML IJ SOLN
50.0000 mg | Freq: Once | INTRAMUSCULAR | Status: AC
  Administered 2014-12-14: 50 mg via INTRAVENOUS

## 2014-12-14 MED ORDER — SODIUM CHLORIDE 0.9 % IV SOLN
Freq: Once | INTRAVENOUS | Status: AC
  Administered 2014-12-14: 12:00:00 via INTRAVENOUS
  Filled 2014-12-14: qty 8

## 2014-12-14 MED ORDER — DIPHENHYDRAMINE HCL 50 MG/ML IJ SOLN
INTRAMUSCULAR | Status: AC
  Filled 2014-12-14: qty 1

## 2014-12-14 MED ORDER — PACLITAXEL CHEMO INJECTION 300 MG/50ML
45.0000 mg/m2 | Freq: Once | INTRAVENOUS | Status: AC
  Administered 2014-12-14: 108 mg via INTRAVENOUS
  Filled 2014-12-14: qty 18

## 2014-12-14 MED ORDER — SODIUM CHLORIDE 0.9 % IV SOLN
200.0000 mg | Freq: Once | INTRAVENOUS | Status: AC
  Administered 2014-12-14: 200 mg via INTRAVENOUS
  Filled 2014-12-14: qty 20

## 2014-12-14 MED ORDER — FAMOTIDINE IN NACL 20-0.9 MG/50ML-% IV SOLN
INTRAVENOUS | Status: AC
  Filled 2014-12-14: qty 50

## 2014-12-14 NOTE — Patient Instructions (Signed)
[facility] Discharge Instructions for Patients Receiving Chemotherapy  Today you received the following chemotherapy agents Taxol/Carboplatin.  To help prevent nausea and vomiting after your treatment, we encourage you to take your nausea medication as directed.   If you develop nausea and vomiting that is not controlled by your nausea medication, call the clinic.   BELOW ARE SYMPTOMS THAT SHOULD BE REPORTED IMMEDIATELY:  *FEVER GREATER THAN 100.5 F  *CHILLS WITH OR WITHOUT FEVER  NAUSEA AND VOMITING THAT IS NOT CONTROLLED WITH YOUR NAUSEA MEDICATION  *UNUSUAL SHORTNESS OF BREATH  *UNUSUAL BRUISING OR BLEEDING  TENDERNESS IN MOUTH AND THROAT WITH OR WITHOUT PRESENCE OF ULCERS  *URINARY PROBLEMS  *BOWEL PROBLEMS  UNUSUAL RASH Items with * indicate a potential emergency and should be followed up as soon as possible.  Feel free to call the clinic you have any questions or concerns. The clinic phone number is [phone].  Please show the [facility] at check-in to the Emergency Department and triage nurse.

## 2014-12-15 ENCOUNTER — Telehealth: Payer: Self-pay | Admitting: Internal Medicine

## 2014-12-15 ENCOUNTER — Ambulatory Visit
Admission: RE | Admit: 2014-12-15 | Discharge: 2014-12-15 | Disposition: A | Discharge status: Home / Self Care | Payer: Medicare Other | Source: Ambulatory Visit | Attending: Radiation Oncology | Admitting: Radiation Oncology

## 2014-12-15 ENCOUNTER — Other Ambulatory Visit: Payer: Self-pay | Admitting: Internal Medicine

## 2014-12-15 DIAGNOSIS — C3411 Malignant neoplasm of upper lobe, right bronchus or lung: Secondary | ICD-10-CM | POA: Diagnosis not present

## 2014-12-15 DIAGNOSIS — Z87891 Personal history of nicotine dependence: Secondary | ICD-10-CM | POA: Diagnosis not present

## 2014-12-15 NOTE — Telephone Encounter (Signed)
Lab appointments cancelled per pof  anne

## 2014-12-16 ENCOUNTER — Encounter: Payer: Self-pay | Admitting: Skilled Nursing Facility1

## 2014-12-16 ENCOUNTER — Ambulatory Visit
Admission: RE | Admit: 2014-12-16 | Discharge: 2014-12-16 | Disposition: A | Discharge status: Home / Self Care | Payer: Medicare Other | Source: Ambulatory Visit | Attending: Radiation Oncology | Admitting: Radiation Oncology

## 2014-12-16 DIAGNOSIS — Z87891 Personal history of nicotine dependence: Secondary | ICD-10-CM | POA: Diagnosis not present

## 2014-12-16 DIAGNOSIS — C3411 Malignant neoplasm of upper lobe, right bronchus or lung: Secondary | ICD-10-CM | POA: Diagnosis not present

## 2014-12-16 NOTE — Progress Notes (Signed)
Subjective:     Patient ID: Nicholas [name], male   DOB: Oct 31, 1934, 80 y.o.   MRN: [id number]  HPI   Review of Systems     Objective:   Physical Exam To assist the pt in identifying some dietary strategies to gain some lost wt back.    Assessment:     Pt identified as being malnourished due to losing some wt. Pt was contacted via the telephone at [phone]. Pt states he lost about 10 pounds but his appetite is great. Pt states he eats several meals a day. Pt wanted to know if there was anything else he could do for a soar throat.    Plan:        Dietitian advised he stay well hydrated and perhaps cold foods such as a milkshake may alleviate his soar throat.

## 2014-12-17 ENCOUNTER — Ambulatory Visit
Admission: RE | Admit: 2014-12-17 | Discharge: 2014-12-17 | Disposition: A | Discharge status: Home / Self Care | Payer: Medicare Other | Source: Ambulatory Visit | Attending: Radiation Oncology | Admitting: Radiation Oncology

## 2014-12-17 ENCOUNTER — Encounter: Payer: Self-pay | Admitting: Radiation Oncology

## 2014-12-17 VITALS — BP 124/52 | HR 72 | Resp 16 | Wt 244.0 lb

## 2014-12-17 DIAGNOSIS — C3411 Malignant neoplasm of upper lobe, right bronchus or lung: Secondary | ICD-10-CM

## 2014-12-17 DIAGNOSIS — Z87891 Personal history of nicotine dependence: Secondary | ICD-10-CM | POA: Diagnosis not present

## 2014-12-17 NOTE — Progress Notes (Addendum)
Weight and vitals stable. Denies pain. Hyperpigmentation within treatment field noted. Reports using radiaplex as directed. Reports an occasional productive cough but, confirms his cough is mostly dry. Reports SOB with exertion. Reports a sore throat despite taking Carafate as directed. Patient completes treatment tomorrow. One month follow up appointment card given. Understands to continues radiaplex bid for the next two weeks and call this RN with future needs.  BP 124/52 mmHg  Pulse 72  Resp 16  Wt 244 lb (110.678 kg) Wt Readings from Last 3 Encounters:  12/17/14 244 lb (110.678 kg)  12/11/14 234 lb 14.4 oz (106.55 kg)  12/11/14 238 lb 9.6 oz (108.228 kg)

## 2014-12-17 NOTE — Progress Notes (Signed)
  Radiation Oncology         [phone]   Name: Baudelio Karnes MRN: [id number]   Date: 12/17/2014  DOB: 1934/08/03     Weekly Radiation Therapy Management  Diagnosis: Garhett Bernhard is a 80 year old male presenting to clinic in regards to his cancer of upper lobe of the right lung (stage IIIA).  Current Dose: 64 Gy  Planned Dose:  66 Gy  Narrative The patient presents for routine under treatment assessment with 22 of 33 treatments completed at this time. Denies pain. Hyperpigmentation within treatment field noted. Reports using radiaplex as directed. Reports an occasional productive cough but, confirms his cough is mostly dry. Reports SOB with exertion. Reports a sore throat despite taking Carafate as directed. Patient completes treatment tomorrow. One month follow up appointment card given. Understands to continues radiaplex bid for the next two weeks and call this RN with future needs. Penultimate txt. Set-up films were reviewed and the chart was checked.  Physical Findings His weight is essentially stable with no significant changes in the status of overall health to be noted at this time.  Impression Keenen Roessner is a 80 year old male presenting to clinic in regards to his cancer of upper lobe of the right lung (stage IIIA). The patient is tolerating radiation.  Plan Pt is to complete radiation treatment tomorrow and will follow up in one month.   This document serves as a record of services personally performed by Tyler Pita, MD. It was created on his behalf by Darcus Austin, a trained medical scribe. The creation of this record is based on the scribe's personal observations and the provider's statements to them. This document has been checked and approved by the attending provider.    ______________________________________________     Sheral Apley. Tammi Klippel, M.D.

## 2014-12-18 ENCOUNTER — Ambulatory Visit: Payer: Medicare Other

## 2014-12-18 ENCOUNTER — Ambulatory Visit
Admission: RE | Admit: 2014-12-18 | Discharge: 2014-12-18 | Disposition: A | Discharge status: Home / Self Care | Payer: Medicare Other | Source: Ambulatory Visit | Attending: Radiation Oncology | Admitting: Radiation Oncology

## 2014-12-18 ENCOUNTER — Encounter: Payer: Self-pay | Admitting: Radiation Oncology

## 2014-12-18 DIAGNOSIS — C3411 Malignant neoplasm of upper lobe, right bronchus or lung: Secondary | ICD-10-CM | POA: Diagnosis not present

## 2014-12-18 DIAGNOSIS — Z87891 Personal history of nicotine dependence: Secondary | ICD-10-CM | POA: Diagnosis not present

## 2014-12-21 ENCOUNTER — Other Ambulatory Visit: Payer: Self-pay | Admitting: Internal Medicine

## 2014-12-21 ENCOUNTER — Other Ambulatory Visit: Payer: Medicare Other

## 2014-12-21 ENCOUNTER — Ambulatory Visit: Payer: Medicare Other

## 2014-12-23 ENCOUNTER — Other Ambulatory Visit: Payer: Self-pay | Admitting: Radiation Oncology

## 2014-12-23 ENCOUNTER — Telehealth: Payer: Self-pay | Admitting: Radiation Oncology

## 2014-12-23 DIAGNOSIS — R1310 Dysphagia, unspecified: Secondary | ICD-10-CM

## 2014-12-23 DIAGNOSIS — C3411 Malignant neoplasm of upper lobe, right bronchus or lung: Secondary | ICD-10-CM

## 2014-12-23 MED ORDER — SUCRALFATE 1 GM/10ML PO SUSP: 1.0000 g | Freq: Three times a day (TID) | ORAL | Status: DC

## 2014-12-23 NOTE — Telephone Encounter (Signed)
Phoned patient making him aware that Carafate has been Escribed to his pharmacy. Reminded patient to take hydrocodone-acetaminophen given to him by Dr. Naaman Plummer. Patent states, "I am already out of those." Explained these tablets should have lasted him until October. Patient states, "I know but, I have been taking them for my arthritis and my throat."

## 2014-12-23 NOTE — Telephone Encounter (Signed)
Phoned patient after speaking with Dr. Tammi Klippel. Encouraged that the patient use Carafate four times per day 15 minutes before meals for pain associated with swallowing. Reminded him a refill of Carafate has already been Escribed to his pharmacy. Patient confirms his pharmacy has already called him to say this is ready for pick up. Encouraged patient to call/follow up with Dr. Juluis Mire reference refill of hydrocodone-acetaminophen. Patient verbalized understanding and expressed appreciation for the call.

## 2014-12-23 NOTE — Telephone Encounter (Signed)
I would recommend he continue carafate for esophageal symptoms.  His pain management appears to be complex, and should probably be coordinated by his primary care physician.

## 2014-12-28 ENCOUNTER — Other Ambulatory Visit: Payer: Medicare Other

## 2015-01-01 ENCOUNTER — Telehealth: Payer: Self-pay | Admitting: *Deleted

## 2015-01-01 NOTE — Telephone Encounter (Signed)
Pt called and left message requesting a call back from nurse.  Spoke with pt and was informed that pt still has burning pain in esophageal and chest post radiation.  Informed pt that it would take 2 -3 weeks maybe up to a month for symptoms to get better.  Encouraged pt to use Carafate as prescribed;  Reinforced with pt not to eat greasy nor spicy foods, eat bland foods, and increased po fluids ( no orange juice nor tomato juice - due to acidity of the fluids ) as tolerated.  Pt voiced understanding. Pt's  Phone    [phone].

## 2015-01-03 NOTE — Progress Notes (Signed)
  Radiation Oncology         [phone] ________________________________  Name: Nicholas [name] MRN: [id number]  Date: 10/30/2014  DOB: 11/26/1966  End of Treatment Note  Diagnosis:   80 y.o gentleman with right upper lung adenocarcinoma - Stage T2a N2 M0 - Stage IIIA     Indication for treatment:  Curative, Chemo-Radiotherapy       Radiation treatment dates:   11/03/2014-12/18/2014  Site/dose:   The primary tumor and involved mediastinal adenopathy were treated to 66 Gy in 33 fractions of 2 Gy.  Beams/energy:   A five field 3D conformal treatment arrangement was used delivering 6 and 10 MV photons.  Daily image-guidance CT was used to align the treatment with the targeted volume  Narrative: The patient tolerated radiation treatment relatively well.  The patient experienced some esophagitis characterized as mild.  The patient also noted fatigue.  Plan: The patient has completed radiation treatment. The patient will return to radiation oncology clinic for routine followup in one month. I advised her to call or return sooner if she has any questions or concerns related to her recovery or treatment. ________________________________  Sheral Apley. Tammi Klippel, M.D.

## 2015-01-04 ENCOUNTER — Other Ambulatory Visit: Payer: Medicare Other

## 2015-01-12 ENCOUNTER — Other Ambulatory Visit: Payer: Medicare Other

## 2015-01-17 ENCOUNTER — Other Ambulatory Visit: Payer: Self-pay | Admitting: Internal Medicine

## 2015-01-18 ENCOUNTER — Encounter (HOSPITAL_COMMUNITY): Payer: Self-pay

## 2015-01-18 ENCOUNTER — Other Ambulatory Visit (HOSPITAL_BASED_OUTPATIENT_CLINIC_OR_DEPARTMENT_OTHER): Payer: Medicare Other

## 2015-01-18 ENCOUNTER — Ambulatory Visit (HOSPITAL_COMMUNITY)
Admission: RE | Admit: 2015-01-18 | Discharge: 2015-01-18 | Disposition: A | Discharge status: Home / Self Care | Payer: Medicare Other | Source: Ambulatory Visit | Attending: Nurse Practitioner | Admitting: Nurse Practitioner

## 2015-01-18 DIAGNOSIS — Z923 Personal history of irradiation: Secondary | ICD-10-CM | POA: Insufficient documentation

## 2015-01-18 DIAGNOSIS — Z9221 Personal history of antineoplastic chemotherapy: Secondary | ICD-10-CM | POA: Insufficient documentation

## 2015-01-18 DIAGNOSIS — R911 Solitary pulmonary nodule: Secondary | ICD-10-CM | POA: Insufficient documentation

## 2015-01-18 DIAGNOSIS — C3492 Malignant neoplasm of unspecified part of left bronchus or lung: Secondary | ICD-10-CM

## 2015-01-18 DIAGNOSIS — R918 Other nonspecific abnormal finding of lung field: Secondary | ICD-10-CM | POA: Diagnosis not present

## 2015-01-18 DIAGNOSIS — Z9889 Other specified postprocedural states: Secondary | ICD-10-CM | POA: Diagnosis not present

## 2015-01-18 DIAGNOSIS — C3411 Malignant neoplasm of upper lobe, right bronchus or lung: Secondary | ICD-10-CM | POA: Insufficient documentation

## 2015-01-18 LAB — COMPREHENSIVE METABOLIC PANEL (CC13)
ALT: 12 U/L (ref 0–55)
AST: 14 U/L (ref 5–34)
Albumin: 2.9 g/dL — ABNORMAL LOW (ref 3.5–5.0)
Alkaline Phosphatase: 79 U/L (ref 40–150)
Anion Gap: 7 mEq/L (ref 3–11)
BUN: 9.4 mg/dL (ref 7.0–26.0)
CHLORIDE: 106 meq/L (ref 98–109)
CO2: 32 meq/L — AB (ref 22–29)
CREATININE: 1 mg/dL (ref 0.7–1.3)
Calcium: 8.8 mg/dL (ref 8.4–10.4)
EGFR: 85 mL/min/{1.73_m2} — ABNORMAL LOW (ref >90)
Glucose: 90 mg/dl (ref 70–140)
Potassium: 4.1 mEq/L (ref 3.5–5.1)
Sodium: 145 mEq/L (ref 136–145)
TOTAL PROTEIN: 6.7 g/dL (ref 6.4–8.3)
Total Bilirubin: 0.22 mg/dL (ref 0.20–1.20)

## 2015-01-18 LAB — CBC WITH DIFFERENTIAL/PLATELET
BASO%: 0.4 % (ref 0.0–2.0)
Basophils Absolute: 0 10*3/uL (ref 0.0–0.1)
EOS%: 2.9 % (ref 0.0–7.0)
Eosinophils Absolute: 0.2 10*3/uL (ref 0.0–0.5)
HCT: 31.9 % — ABNORMAL LOW (ref 38.4–49.9)
HGB: 9.9 g/dL — ABNORMAL LOW (ref 13.0–17.1)
LYMPH%: 19 % (ref 14.0–49.0)
MCH: 27 pg — ABNORMAL LOW (ref 27.2–33.4)
MCHC: 31 g/dL — AB (ref 32.0–36.0)
MCV: 86.9 fL (ref 79.3–98.0)
MONO#: 0.8 10*3/uL (ref 0.1–0.9)
MONO%: 13.4 % (ref 0.0–14.0)
NEUT#: 3.6 10*3/uL (ref 1.5–6.5)
NEUT%: 64.3 % (ref 39.0–75.0)
NRBC: 0 % (ref 0–0)
Platelets: 242 10*3/uL (ref 140–400)
RBC: 3.67 10*6/uL — AB (ref 4.20–5.82)
RDW: 20.5 % — AB (ref 11.0–14.6)
WBC: 5.6 10*3/uL (ref 4.0–10.3)
lymph#: 1.1 10*3/uL (ref 0.9–3.3)

## 2015-01-18 MED ORDER — IOHEXOL 300 MG/ML  SOLN
75.0000 mL | Freq: Once | INTRAMUSCULAR | Status: AC | PRN
  Administered 2015-01-18: 75 mL via INTRAVENOUS

## 2015-01-21 ENCOUNTER — Ambulatory Visit
Admission: RE | Admit: 2015-01-21 | Discharge: 2015-01-21 | Disposition: A | Discharge status: Home / Self Care | Payer: Medicare Other | Source: Ambulatory Visit | Attending: Radiation Oncology | Admitting: Radiation Oncology

## 2015-01-21 ENCOUNTER — Encounter: Payer: Self-pay | Admitting: Radiation Oncology

## 2015-01-21 VITALS — BP 162/81 | HR 77 | Resp 18 | Wt 242.0 lb

## 2015-01-21 DIAGNOSIS — C3411 Malignant neoplasm of upper lobe, right bronchus or lung: Secondary | ICD-10-CM

## 2015-01-21 NOTE — Progress Notes (Signed)
Radiation Oncology         [phone] ________________________________  Name: Nicholas [name] MRN: [id number]  Date: 01/21/2015  DOB: [birth year]  Follow-Up Visit Note  CC: [name], MD  Kelby Aline, MD  Diagnosis: Right upper lung adenocarcinoma - Stage T2a N2 M0 - Stage IIIA       ICD-9-CM ICD-10-CM   1. Cancer of upper lobe of right lung, stage IIIA adenocarcinoma 162.3 C34.11     Interval Since Last Radiation:  1  months    (11/03/2014-12/18/2014)  Narrative:  The patient returns today for routine follow-up appointment with radiation oncology.  The patient's weight and vitals are currently stable. He denies symptoms of pain, headache, dizziness, nausea or vomiting. His productive cough continues with sore throat as his major complaint. "Yesterday I slept from nine o'clock to one o'clock. I wake up at night about 2 o'clock in the morning and stay awake until four o'clock," the patient stated. Yet, reports that his energy levels are returning to normal. The patient speaks with a very strong dialect. His ambulatory status for today's appointment was wheelchair bound. He wore his sunglasses's indoors. The patient presented a healthy mental status and was accompanied by his daughter for today's radiation oncology appointment.                   ALLERGIES:  is allergic to amlodipine.  Meds: Current Outpatient Prescriptions  Medication Sig Dispense Refill  . carvedilol (COREG) 25 MG tablet Take 1 tablet (25 mg total) by mouth 2 (two) times daily with a meal. 180 tablet 3  . diclofenac sodium (VOLTAREN) 1 % GEL Apply 2 g topically 3 (three) times daily as needed. 200 g 6  . enalapril (VASOTEC) 20 MG tablet Take 2 tablets (40 mg total) by mouth daily. 90 tablet 3  . FeFum-FePoly-FA-B Cmp-C-Biot (INTEGRA PLUS) CAPS Take 1 capsule by mouth daily. 30 capsule 3  . gabapentin (NEURONTIN) 600 MG tablet Take 1 tablet (600 mg total) by mouth 2 (two) times daily. 180 tablet 3  .  hydrochlorothiazide (MICROZIDE) 12.5 MG capsule Take 1 capsule (12.5 mg total) by mouth daily. 90 capsule 3  . HYDROcodone-acetaminophen (NORCO) 10-325 MG per tablet Take 1 tablet by mouth every 6 (six) hours as needed for moderate pain or severe pain. 120 tablet 0  . meloxicam (MOBIC) 7.5 MG tablet TAKE 1 TABLET (7.5 MG TOTAL) BY MOUTH DAILY AS NEEDED FOR PAIN. 90 tablet 3  . meloxicam (MOBIC) 7.5 MG tablet TAKE 1 TABLET (7.5 MG TOTAL) BY MOUTH DAILY AS NEEDED FOR PAIN. 30 tablet 2  . pantoprazole (PROTONIX) 20 MG tablet TAKE 1 TABLET (20 MG TOTAL) BY MOUTH DAILY.  0  . pantoprazole (PROTONIX) 20 MG tablet TAKE 1 TABLET (20 MG TOTAL) BY MOUTH DAILY. 90 tablet 0  . prochlorperazine (COMPAZINE) 10 MG tablet Take 1 tablet (10 mg total) by mouth every 6 (six) hours as needed for nausea or vomiting. 30 tablet 0  . rosuvastatin (CRESTOR) 10 MG tablet Take 1 tablet (10 mg total) by mouth daily. 90 tablet 3  . sucralfate (CARAFATE) 1 GM/10ML suspension Take 10 mLs (1 g total) by mouth 4 (four) times daily -  with meals and at bedtime. 420 mL 0  . triamcinolone cream (KENALOG) 0.1 % Apply 1 application topically daily as needed (itching). 80 g 5  . Vitamin D, Ergocalciferol, (DRISDOL) 50000 UNITS CAPS capsule TAKE 1 CAPSULE (50,000 UNITS TOTAL) BY MOUTH EVERY 7 (SEVEN) DAYS. 8  capsule 0  . VOLTAREN 1 % GEL APPLY TO AFFECTED AREA 3 TIMES A DAY FOR JOINT PAIN AS NEEDED 200 g 6   No current facility-administered medications for this encounter.    Physical Findings: The patient is in no acute distress and is alert and oriented x3.  weight is 242 lb (109.77 kg). His blood pressure is 162/81 and his pulse is 77. His respiration is 18 and oxygen saturation is 96%. . There are no significant changes in the status of the patient's overall health to be noted at this time.  Lab Findings: Lab Results  Component Value Date   WBC 5.6 01/18/2015   WBC 9.1 10/06/2014   HGB 9.9* 01/18/2015   HGB 9.6* 10/06/2014    HCT 31.9* 01/18/2015   HCT 31.7* 10/06/2014   PLT 242 01/18/2015   PLT 321 10/06/2014    Lab Results  Component Value Date   NA 145 01/18/2015   NA 139 09/02/2014   K 4.1 01/18/2015   K 3.8 09/02/2014   CHLORIDE 106 01/18/2015   CO2 32* 01/18/2015   CO2 29 09/02/2014   GLUCOSE 90 01/18/2015   GLUCOSE 103* 09/02/2014   BUN 9.4 01/18/2015   BUN 18 09/02/2014   CREATININE 1.0 01/18/2015   CREATININE 1.27 09/02/2014   CREATININE 1.05 04/17/2014   BILITOT 0.22 01/18/2015   BILITOT 0.4 09/02/2014   ALKPHOS 79 01/18/2015   ALKPHOS 75 09/02/2014   AST 14 01/18/2015   AST 16 09/02/2014   ALT 12 01/18/2015   ALT 16 09/02/2014   PROT 6.7 01/18/2015   PROT 7.2 09/02/2014   ALBUMIN 2.9* 01/18/2015   ALBUMIN 2.9* 09/02/2014   CALCIUM 8.8 01/18/2015   CALCIUM 8.3* 09/02/2014   ANIONGAP 7 01/18/2015   ANIONGAP 9 09/02/2014    Radiographic Findings: Ct Chest W Contrast  01/18/2015   CLINICAL DATA:  Patient with history of lung cancer. Status post chemotherapy and radiation.  EXAM: CT CHEST WITH CONTRAST  TECHNIQUE: Multidetector CT imaging of the chest was performed during intravenous contrast administration.  CONTRAST:  40m OMNIPAQUE IOHEXOL 300 MG/ML  SOLN  COMPARISON:  PET-CT 09/28/2014; CT chest 08/25/2014  FINDINGS: Mediastinum/Nodes: Grossly unchanged mediastinal and bilateral hilar lymph nodes including a 1.1 cm right peritracheal lymph node (image 22; series 2), previously measuring 1.1 cm. Stable 1.4 cm right hilar lymph node (image 24; series 2) and a 1.3 cm inferior right hilar lymph node (image 26; series 2). Unchanged 1.5 cm subcarinal lymph node (image 26; series 2). Unchanged 1.0 cm left hilar lymph node (image 28; series 2). Small amount of pericardial fluid. Normal heart size. Coronary arterial vascular calcifications.  Lungs/Pleura: Central airways are patent. Interval decrease in size of spiculated right upper lobe mass measuring 3.4 x 2.3 cm (image 10; series 5),  previously 3.5 x 2.7 cm. Interval development of 2 focal areas of ground-glass attenuation within the right upper lobe measuring 1.3 cm (image 13; series 5) and 1.0 cm (image 25; series 5). Additionally there is suggestion of subtle tree-in-bud nodularity within the right upper lobe, new from prior. Stable subpleural ground-glass opacity within the right upper lobe (image 23; series 5). Dependent atelectasis within the bilateral lower lobes. No large pleural effusion or pneumothorax.  Upper abdomen: Unchanged 1.5 cm left adrenal nodule. Stable right adrenal gland thickening.  Musculoskeletal: Extensive degenerative changes of the thoracic spine. No aggressive or acute appearing osseous lesions. Soft tissue anasarca.  IMPRESSION: Interval decrease in size of spiculated right upper lobe  mass.  Interval development of 2 focal areas of ground-glass attenuation and associated tree-in-bud nodularity within the right upper lobe which is nonspecific however likely infectious/ inflammatory or post treatment in etiology. Recommend attention on followup.   Electronically Signed   By: Lovey Newcomer M.D.   On: 01/18/2015 15:07    Impression:  Nicholas [name] is a 80 y.o gentleman presenting to clinic with right upper lung adenocarcinoma - Stage T2a N2 M0 - Stage IIIA. The patient is recovering from the effects of radiation. The patient understands that he can access his appointment information via [facility]. The patient understands the results of his most recent CT scan and that his follow-up appointments with radiation oncology will strictly take place on an as needed basis.  Plan: The patient's most recent CT scan was reviewed in detail. The patient is aware of his follow-up appointment to take place next week as scheduled on Monday 01/25/2015 with Dr. Julien Nordmann. Future CT scans and follow-up appointments will take place under Dr. Worthy Flank advisement. The patient has been encouraged to maintain his weight, as it has fluctuated  through out his treatment. The patient has been advised of his follow-up appointment with radiation oncology to take place on an as needed basis. All vocalized questions and concerns have been addressed. If the patient develops any further questions or concerns in regards to his treatment and recovery, he has been encouraged to contact Dr. Tammi Klippel, MD.    This document serves as a record of services personally performed by Tyler Pita, MD. It was created on his behalf by Lenn Cal, a trained medical scribe. The creation of this record is based on the scribe's personal observations and the provider's statements to them. This document has been checked and approved by the attending provider.  _____________________________________  Sheral Apley. Tammi Klippel, M.D.

## 2015-01-21 NOTE — Progress Notes (Signed)
Weight and vitals stable. Denies pain. Reports productive cough continues. Denies shortness of breath. Skin of treatment field has returned to normal color and appearance. Continued sore throat is his only complaint today. Denies headache, dizziness, nausea or vomiting.  BP 162/81 mmHg  Pulse 77  Resp 18  Wt 242 lb (109.77 kg)  SpO2 96% Wt Readings from Last 3 Encounters:  01/21/15 242 lb (109.77 kg)  12/17/14 244 lb (110.678 kg)  12/11/14 234 lb 14.4 oz (106.55 kg)

## 2015-01-25 ENCOUNTER — Encounter: Payer: Self-pay | Admitting: *Deleted

## 2015-01-25 ENCOUNTER — Encounter: Payer: Self-pay | Admitting: Internal Medicine

## 2015-01-25 ENCOUNTER — Ambulatory Visit (HOSPITAL_BASED_OUTPATIENT_CLINIC_OR_DEPARTMENT_OTHER): Payer: Medicare Other | Admitting: Internal Medicine

## 2015-01-25 ENCOUNTER — Telehealth: Payer: Self-pay | Admitting: *Deleted

## 2015-01-25 ENCOUNTER — Telehealth: Payer: Self-pay | Admitting: Internal Medicine

## 2015-01-25 VITALS — BP 175/78 | HR 72 | Temp 99.3°F | Resp 18 | Ht 71.0 in | Wt 234.8 lb

## 2015-01-25 DIAGNOSIS — C3411 Malignant neoplasm of upper lobe, right bronchus or lung: Secondary | ICD-10-CM

## 2015-01-25 DIAGNOSIS — I10 Essential (primary) hypertension: Secondary | ICD-10-CM

## 2015-01-25 NOTE — Telephone Encounter (Signed)
Pt confirmed labs/ov per 09/19 POF, gave pt AVS and Calendar.Cherylann Banas, sent msg to add chemo

## 2015-01-25 NOTE — Progress Notes (Signed)
Oncology Nurse Navigator Documentation  Oncology Nurse Navigator Flowsheets 01/25/2015  Navigator Encounter Type Clinic/MDC;Other  Patient Visit Type Follow-up;Medonc  Treatment Phase Treatment  Barriers/Navigation Needs I spoke with patient today at [facility] with Dr. Julien Nordmann.  Patient is doing well and only complaints today is sore throat.  Patient is on medication for that.  Sister is able to bring patient to appointments.  I looked to see if FA have spoke with patient and do not see a note.  I will follow up with them to see if there are any needs to be addressed.   Support Groups/Services Friends and Family  Time Spent with Patient 15

## 2015-01-25 NOTE — Telephone Encounter (Signed)
Per staff message and POF I have scheduled appts. Advised scheduler of appts. JMW  
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
## 2015-01-27 ENCOUNTER — Other Ambulatory Visit: Payer: Self-pay | Admitting: Internal Medicine

## 2015-01-27 MED ORDER — GABAPENTIN 600 MG PO TABS: 600.0000 mg | ORAL_TABLET | Freq: Two times a day (BID) | ORAL | Status: DC

## 2015-01-27 NOTE — Telephone Encounter (Signed)
Jake from [facility] called requesting Gabapentin to be filled.

## 2015-01-28 ENCOUNTER — Encounter: Payer: Self-pay | Admitting: Internal Medicine

## 2015-01-28 ENCOUNTER — Ambulatory Visit (INDEPENDENT_AMBULATORY_CARE_PROVIDER_SITE_OTHER): Payer: Medicare Other | Admitting: Internal Medicine

## 2015-01-28 VITALS — BP 143/73 | HR 72 | Temp 98.4°F | Ht 71.0 in | Wt 235.9 lb

## 2015-01-28 DIAGNOSIS — M150 Primary generalized (osteo)arthritis: Secondary | ICD-10-CM

## 2015-01-28 DIAGNOSIS — Z23 Encounter for immunization: Secondary | ICD-10-CM

## 2015-01-28 DIAGNOSIS — L0291 Cutaneous abscess, unspecified: Secondary | ICD-10-CM | POA: Insufficient documentation

## 2015-01-28 DIAGNOSIS — I10 Essential (primary) hypertension: Secondary | ICD-10-CM

## 2015-01-28 DIAGNOSIS — M159 Polyosteoarthritis, unspecified: Secondary | ICD-10-CM

## 2015-01-28 DIAGNOSIS — B9689 Other specified bacterial agents as the cause of diseases classified elsewhere: Secondary | ICD-10-CM | POA: Diagnosis not present

## 2015-01-28 DIAGNOSIS — L02414 Cutaneous abscess of left upper limb: Secondary | ICD-10-CM | POA: Diagnosis not present

## 2015-01-28 DIAGNOSIS — Z0000 Encounter for general adult medical examination without abnormal findings: Secondary | ICD-10-CM

## 2015-01-28 MED ORDER — HYDROCHLOROTHIAZIDE 12.5 MG PO CAPS: 12.5000 mg | ORAL_CAPSULE | Freq: Every day | ORAL | Status: DC

## 2015-01-28 MED ORDER — DOXYCYCLINE HYCLATE 100 MG PO CAPS: 100.0000 mg | ORAL_CAPSULE | Freq: Two times a day (BID) | ORAL | Status: DC

## 2015-01-28 MED ORDER — ENALAPRIL MALEATE 20 MG PO TABS: 40.0000 mg | ORAL_TABLET | Freq: Every day | ORAL | Status: DC

## 2015-01-28 MED ORDER — AMOXICILLIN 500 MG PO CAPS: 500.0000 mg | ORAL_CAPSULE | Freq: Two times a day (BID) | ORAL | Status: DC

## 2015-01-28 MED ORDER — CARVEDILOL 25 MG PO TABS: 25.0000 mg | ORAL_TABLET | Freq: Two times a day (BID) | ORAL | Status: DC

## 2015-01-28 MED ORDER — HYDROCODONE-ACETAMINOPHEN 10-325 MG PO TABS: 1.0000 | ORAL_TABLET | Freq: Four times a day (QID) | ORAL | Status: DC | PRN

## 2015-01-28 NOTE — Assessment & Plan Note (Signed)
Swelling on underside of arm, draining purulent material, suggestive of staph infection, also with surrounding erythema and tenderness, will therefore also cover for strep. No signs or symptoms of systemic infections. No fever, malaise.  Plan-  - Amoxicillin '500mg'$  BID for 5 days - Doxycycline '100mg'$  BID for 5 days also. - Follow up in one week if getting worse, otherwise follow up with PCP in 1-2 months. - refilled pain meds- according to pain contract., one month refill given only.

## 2015-01-28 NOTE — Progress Notes (Signed)
Internal Medicine Clinic Attending  Case discussed with Dr. Emokpae at the time of the visit.  We reviewed the resident's history and exam and pertinent patient test results.  I agree with the assessment, diagnosis, and plan of care documented in the resident's note.  
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
## 2015-01-28 NOTE — Patient Instructions (Signed)
We will be prescribing two antibiotics for you.   Amoxicillin- Take one tablet in the morning and one at night for 5 days. Doxycycline- Take one tablet in the morning and one at night for 5 days.   If swelling gets worse, then come back to see [place] in 1 week.

## 2015-02-01 ENCOUNTER — Other Ambulatory Visit (HOSPITAL_BASED_OUTPATIENT_CLINIC_OR_DEPARTMENT_OTHER): Payer: Medicare Other

## 2015-02-01 ENCOUNTER — Ambulatory Visit (HOSPITAL_BASED_OUTPATIENT_CLINIC_OR_DEPARTMENT_OTHER): Payer: Medicare Other

## 2015-02-01 VITALS — BP 186/94 | HR 68 | Temp 98.7°F | Resp 20

## 2015-02-01 DIAGNOSIS — Z5111 Encounter for antineoplastic chemotherapy: Secondary | ICD-10-CM | POA: Diagnosis not present

## 2015-02-01 DIAGNOSIS — C3492 Malignant neoplasm of unspecified part of left bronchus or lung: Secondary | ICD-10-CM

## 2015-02-01 DIAGNOSIS — C3411 Malignant neoplasm of upper lobe, right bronchus or lung: Secondary | ICD-10-CM

## 2015-02-01 LAB — CBC WITH DIFFERENTIAL/PLATELET
BASO%: 1.3 % (ref 0.0–2.0)
Basophils Absolute: 0 10*3/uL (ref 0.0–0.1)
EOS%: 6.9 % (ref 0.0–7.0)
Eosinophils Absolute: 0.2 10*3/uL (ref 0.0–0.5)
HEMATOCRIT: 32.3 % — AB (ref 38.4–49.9)
HEMOGLOBIN: 10.2 g/dL — AB (ref 13.0–17.1)
LYMPH#: 1 10*3/uL (ref 0.9–3.3)
LYMPH%: 27.9 % (ref 14.0–49.0)
MCH: 26.7 pg — ABNORMAL LOW (ref 27.2–33.4)
MCHC: 31.7 g/dL — ABNORMAL LOW (ref 32.0–36.0)
MCV: 84.2 fL (ref 79.3–98.0)
MONO#: 0.5 10*3/uL (ref 0.1–0.9)
MONO%: 14.2 % — ABNORMAL HIGH (ref 0.0–14.0)
NEUT%: 49.7 % (ref 39.0–75.0)
NEUTROS ABS: 1.8 10*3/uL (ref 1.5–6.5)
PLATELETS: 268 10*3/uL (ref 140–400)
RBC: 3.83 10*6/uL — ABNORMAL LOW (ref 4.20–5.82)
RDW: 21.3 % — AB (ref 11.0–14.6)
WBC: 3.6 10*3/uL — AB (ref 4.0–10.3)

## 2015-02-01 LAB — COMPREHENSIVE METABOLIC PANEL (CC13)
ALBUMIN: 3.2 g/dL — AB (ref 3.5–5.0)
ALT: 13 U/L (ref 0–55)
ANION GAP: 9 meq/L (ref 3–11)
AST: 20 U/L (ref 5–34)
Alkaline Phosphatase: 85 U/L (ref 40–150)
BILIRUBIN TOTAL: 0.32 mg/dL (ref 0.20–1.20)
BUN: 13.8 mg/dL (ref 7.0–26.0)
CALCIUM: 9.7 mg/dL (ref 8.4–10.4)
CO2: 29 mEq/L (ref 22–29)
CREATININE: 1.1 mg/dL (ref 0.7–1.3)
Chloride: 106 mEq/L (ref 98–109)
EGFR: 75 mL/min/{1.73_m2} — ABNORMAL LOW (ref >90)
Glucose: 96 mg/dl (ref 70–140)
Potassium: 3.7 mEq/L (ref 3.5–5.1)
Sodium: 144 mEq/L (ref 136–145)
TOTAL PROTEIN: 7.1 g/dL (ref 6.4–8.3)

## 2015-02-01 MED ORDER — SODIUM CHLORIDE 0.9 % IV SOLN
580.0000 mg | Freq: Once | INTRAVENOUS | Status: AC
  Administered 2015-02-01: 580 mg via INTRAVENOUS
  Filled 2015-02-01: qty 58

## 2015-02-01 MED ORDER — SODIUM CHLORIDE 0.9 % IV SOLN
Freq: Once | INTRAVENOUS | Status: AC
  Administered 2015-02-01: 13:00:00 via INTRAVENOUS
  Filled 2015-02-01: qty 8

## 2015-02-01 MED ORDER — FAMOTIDINE IN NACL 20-0.9 MG/50ML-% IV SOLN
20.0000 mg | Freq: Once | INTRAVENOUS | Status: AC
  Administered 2015-02-01: 20 mg via INTRAVENOUS

## 2015-02-01 MED ORDER — PACLITAXEL CHEMO INJECTION 300 MG/50ML
175.0000 mg/m2 | Freq: Once | INTRAVENOUS | Status: AC
  Administered 2015-02-01: 402 mg via INTRAVENOUS
  Filled 2015-02-01: qty 67

## 2015-02-01 MED ORDER — FAMOTIDINE IN NACL 20-0.9 MG/50ML-% IV SOLN
INTRAVENOUS | Status: AC
  Filled 2015-02-01: qty 50

## 2015-02-01 MED ORDER — DIPHENHYDRAMINE HCL 50 MG/ML IJ SOLN
50.0000 mg | Freq: Once | INTRAMUSCULAR | Status: AC
  Administered 2015-02-01: 50 mg via INTRAVENOUS

## 2015-02-01 MED ORDER — DIPHENHYDRAMINE HCL 50 MG/ML IJ SOLN
INTRAMUSCULAR | Status: AC
  Filled 2015-02-01: qty 1

## 2015-02-01 MED ORDER — SODIUM CHLORIDE 0.9 % IV SOLN
Freq: Once | INTRAVENOUS | Status: AC
  Administered 2015-02-01: 13:00:00 via INTRAVENOUS

## 2015-02-01 NOTE — Patient Instructions (Signed)
[facility] Discharge Instructions for Patients Receiving Chemotherapy  Today you received the following chemotherapy agents:  Taxol and Carboplatin  To help prevent nausea and vomiting after your treatment, we encourage you to take your nausea medication as ordered per MD.   If you develop nausea and vomiting that is not controlled by your nausea medication, call the clinic.   BELOW ARE SYMPTOMS THAT SHOULD BE REPORTED IMMEDIATELY:  *FEVER GREATER THAN 100.5 F  *CHILLS WITH OR WITHOUT FEVER  NAUSEA AND VOMITING THAT IS NOT CONTROLLED WITH YOUR NAUSEA MEDICATION  *UNUSUAL SHORTNESS OF BREATH  *UNUSUAL BRUISING OR BLEEDING  TENDERNESS IN MOUTH AND THROAT WITH OR WITHOUT PRESENCE OF ULCERS  *URINARY PROBLEMS  *BOWEL PROBLEMS  UNUSUAL RASH Items with * indicate a potential emergency and should be followed up as soon as possible.  Feel free to call the clinic you have any questions or concerns. The clinic phone number is [phone].  Please show the CHEMO ALERT CARD at check-in to the Emergency Department and triage nurse.   

## 2015-02-02 ENCOUNTER — Telehealth: Payer: Self-pay | Admitting: Nurse Practitioner

## 2015-02-02 NOTE — Telephone Encounter (Signed)
called  Patient and moved his appointment to [facility] per lisa pof 9/26 under [email or web address]

## 2015-02-03 ENCOUNTER — Ambulatory Visit (HOSPITAL_BASED_OUTPATIENT_CLINIC_OR_DEPARTMENT_OTHER): Payer: Medicare Other

## 2015-02-03 VITALS — BP 130/71 | HR 70 | Temp 98.8°F

## 2015-02-03 DIAGNOSIS — Z5189 Encounter for other specified aftercare: Secondary | ICD-10-CM

## 2015-02-03 DIAGNOSIS — C3411 Malignant neoplasm of upper lobe, right bronchus or lung: Secondary | ICD-10-CM | POA: Diagnosis not present

## 2015-02-03 MED ORDER — PEGFILGRASTIM INJECTION 6 MG/0.6ML ~~LOC~~
6.0000 mg | PREFILLED_SYRINGE | Freq: Once | SUBCUTANEOUS | Status: AC
  Administered 2015-02-03: 6 mg via SUBCUTANEOUS
  Filled 2015-02-03: qty 0.6

## 2015-02-03 NOTE — Patient Instructions (Signed)
Pegfilgrastim injection What is this medicine? PEGFILGRASTIM (peg fil GRA stim) is a long-acting granulocyte colony-stimulating factor that stimulates the growth of neutrophils, a type of white blood cell important in the body's fight against infection. It is used to reduce the incidence of fever and infection in patients with certain types of cancer who are receiving chemotherapy that affects the bone marrow. This medicine may be used for other purposes; ask your health care provider or pharmacist if you have questions. COMMON BRAND NAME(S): Neulasta What should I tell my health care provider before I take this medicine? They need to know if you have any of these conditions: -latex allergy -ongoing radiation therapy -sickle cell disease -skin reactions to acrylic adhesives (On-Body Injector only) -an unusual or allergic reaction to pegfilgrastim, filgrastim, other medicines, foods, dyes, or preservatives -pregnant or trying to get pregnant -breast-feeding How should I use this medicine? This medicine is for injection under the skin. If you get this medicine at home, you will be taught how to prepare and give the pre-filled syringe or how to use the On-body Injector. Refer to the patient Instructions for Use for detailed instructions. Use exactly as directed. Take your medicine at regular intervals. Do not take your medicine more often than directed. It is important that you put your used needles and syringes in a special sharps container. Do not put them in a trash can. If you do not have a sharps container, call your pharmacist or healthcare provider to get one. Talk to your pediatrician regarding the use of this medicine in children. Special care may be needed. Overdosage: If you think you have taken too much of this medicine contact a poison control center or emergency room at once. NOTE: This medicine is only for you. Do not share this medicine with others. What if I miss a dose? It is  important not to miss your dose. Call your doctor or health care professional if you miss your dose. If you miss a dose due to an On-body Injector failure or leakage, a new dose should be administered as soon as possible using a single prefilled syringe for manual use. What may interact with this medicine? Interactions have not been studied. Give your health care provider a list of all the medicines, herbs, non-prescription drugs, or dietary supplements you use. Also tell them if you smoke, drink alcohol, or use illegal drugs. Some items may interact with your medicine. This list may not describe all possible interactions. Give your health care provider a list of all the medicines, herbs, non-prescription drugs, or dietary supplements you use. Also tell them if you smoke, drink alcohol, or use illegal drugs. Some items may interact with your medicine. What should I watch for while using this medicine? You may need blood work done while you are taking this medicine. If you are going to need a MRI, CT scan, or other procedure, tell your doctor that you are using this medicine (On-Body Injector only). What side effects may I notice from receiving this medicine? Side effects that you should report to your doctor or health care professional as soon as possible: -allergic reactions like skin rash, itching or hives, swelling of the face, lips, or tongue -dizziness -fever -pain, redness, or irritation at site where injected -pinpoint red spots on the skin -shortness of breath or breathing problems -stomach or side pain, or pain at the shoulder -swelling -tiredness -trouble passing urine Side effects that usually do not require medical attention (report to your doctor   or health care professional if they continue or are bothersome): -bone pain -muscle pain This list may not describe all possible side effects. Call your doctor for medical advice about side effects. You may report side effects to FDA at  1-800-FDA-1088. Where should I keep my medicine? Keep out of the reach of children. Store pre-filled syringes in a refrigerator between 2 and 8 degrees C (36 and 46 degrees F). Do not freeze. Keep in carton to protect from light. Throw away this medicine if it is left out of the refrigerator for more than 48 hours. Throw away any unused medicine after the expiration date. NOTE: This sheet is a summary. It may not cover all possible information. If you have questions about this medicine, talk to your doctor, pharmacist, or health care provider.  2015, Elsevier/Gold Standard. (2013-07-24 16:14:05)  

## 2015-02-08 ENCOUNTER — Other Ambulatory Visit (HOSPITAL_BASED_OUTPATIENT_CLINIC_OR_DEPARTMENT_OTHER): Payer: Medicare Other

## 2015-02-08 DIAGNOSIS — C3492 Malignant neoplasm of unspecified part of left bronchus or lung: Secondary | ICD-10-CM | POA: Diagnosis not present

## 2015-02-08 LAB — CBC WITH DIFFERENTIAL/PLATELET
BASO%: 0.3 % (ref 0.0–2.0)
BASOS ABS: 0.1 10*3/uL (ref 0.0–0.1)
EOS ABS: 0.4 10*3/uL (ref 0.0–0.5)
EOS%: 2.2 % (ref 0.0–7.0)
HCT: 29.9 % — ABNORMAL LOW (ref 38.4–49.9)
HGB: 9.4 g/dL — ABNORMAL LOW (ref 13.0–17.1)
LYMPH%: 7.4 % — AB (ref 14.0–49.0)
MCH: 27.3 pg (ref 27.2–33.4)
MCHC: 31.4 g/dL — AB (ref 32.0–36.0)
MCV: 86.9 fL (ref 79.3–98.0)
MONO#: 2.4 10*3/uL — ABNORMAL HIGH (ref 0.1–0.9)
MONO%: 12.1 % (ref 0.0–14.0)
NEUT#: 15.7 10*3/uL — ABNORMAL HIGH (ref 1.5–6.5)
NEUT%: 78 % — AB (ref 39.0–75.0)
Platelets: 159 10*3/uL (ref 140–400)
RBC: 3.44 10*6/uL — AB (ref 4.20–5.82)
RDW: 19.1 % — ABNORMAL HIGH (ref 11.0–14.6)
WBC: 20.1 10*3/uL — AB (ref 4.0–10.3)
lymph#: 1.5 10*3/uL (ref 0.9–3.3)

## 2015-02-08 LAB — COMPREHENSIVE METABOLIC PANEL (CC13)
ALK PHOS: 116 U/L (ref 40–150)
ALT: 11 U/L (ref 0–55)
ANION GAP: 6 meq/L (ref 3–11)
AST: 15 U/L (ref 5–34)
Albumin: 3 g/dL — ABNORMAL LOW (ref 3.5–5.0)
BUN: 21.3 mg/dL (ref 7.0–26.0)
CO2: 30 meq/L — AB (ref 22–29)
Calcium: 8.4 mg/dL (ref 8.4–10.4)
Chloride: 104 mEq/L (ref 98–109)
Creatinine: 1.1 mg/dL (ref 0.7–1.3)
EGFR: 72 mL/min/{1.73_m2} — AB (ref >90)
GLUCOSE: 137 mg/dL (ref 70–140)
POTASSIUM: 3.5 meq/L (ref 3.5–5.1)
SODIUM: 140 meq/L (ref 136–145)
Total Bilirubin: <0.3 mg/dL (ref 0.20–1.20)
Total Protein: 6.1 g/dL — ABNORMAL LOW (ref 6.4–8.3)

## 2015-02-15 ENCOUNTER — Other Ambulatory Visit (HOSPITAL_BASED_OUTPATIENT_CLINIC_OR_DEPARTMENT_OTHER): Payer: Medicare Other

## 2015-02-15 DIAGNOSIS — C3411 Malignant neoplasm of upper lobe, right bronchus or lung: Secondary | ICD-10-CM

## 2015-02-15 LAB — CBC WITH DIFFERENTIAL/PLATELET
BASO%: 0.6 % (ref 0.0–2.0)
BASOS ABS: 0 10*3/uL (ref 0.0–0.1)
EOS%: 2.7 % (ref 0.0–7.0)
Eosinophils Absolute: 0.2 10*3/uL (ref 0.0–0.5)
HEMATOCRIT: 31.9 % — AB (ref 38.4–49.9)
HGB: 10.2 g/dL — ABNORMAL LOW (ref 13.0–17.1)
LYMPH#: 0.9 10*3/uL (ref 0.9–3.3)
LYMPH%: 12.8 % — AB (ref 14.0–49.0)
MCH: 27.2 pg (ref 27.2–33.4)
MCHC: 31.9 g/dL — AB (ref 32.0–36.0)
MCV: 85.2 fL (ref 79.3–98.0)
MONO#: 0.8 10*3/uL (ref 0.1–0.9)
MONO%: 11.1 % (ref 0.0–14.0)
NEUT#: 5.4 10*3/uL (ref 1.5–6.5)
NEUT%: 72.8 % (ref 39.0–75.0)
PLATELETS: 161 10*3/uL (ref 140–400)
RBC: 3.75 10*6/uL — AB (ref 4.20–5.82)
RDW: 20.1 % — ABNORMAL HIGH (ref 11.0–14.6)
WBC: 7.4 10*3/uL (ref 4.0–10.3)

## 2015-02-15 LAB — COMPREHENSIVE METABOLIC PANEL (CC13)
ALT: 13 U/L (ref 0–55)
ANION GAP: 8 meq/L (ref 3–11)
AST: 16 U/L (ref 5–34)
Albumin: 3.1 g/dL — ABNORMAL LOW (ref 3.5–5.0)
Alkaline Phosphatase: 104 U/L (ref 40–150)
BUN: 11.7 mg/dL (ref 7.0–26.0)
CALCIUM: 8.7 mg/dL (ref 8.4–10.4)
CHLORIDE: 104 meq/L (ref 98–109)
CO2: 27 meq/L (ref 22–29)
Creatinine: 1.1 mg/dL (ref 0.7–1.3)
EGFR: 72 mL/min/{1.73_m2} — AB (ref >90)
Glucose: 109 mg/dl (ref 70–140)
POTASSIUM: 4.3 meq/L (ref 3.5–5.1)
Sodium: 139 mEq/L (ref 136–145)
Total Bilirubin: <0.3 mg/dL (ref 0.20–1.20)
Total Protein: 6.6 g/dL (ref 6.4–8.3)

## 2015-02-22 ENCOUNTER — Other Ambulatory Visit (HOSPITAL_BASED_OUTPATIENT_CLINIC_OR_DEPARTMENT_OTHER): Payer: Medicare Other

## 2015-02-22 ENCOUNTER — Ambulatory Visit (HOSPITAL_BASED_OUTPATIENT_CLINIC_OR_DEPARTMENT_OTHER): Payer: Medicare Other | Admitting: Oncology

## 2015-02-22 ENCOUNTER — Ambulatory Visit (HOSPITAL_BASED_OUTPATIENT_CLINIC_OR_DEPARTMENT_OTHER): Payer: Medicare Other

## 2015-02-22 ENCOUNTER — Encounter: Payer: Self-pay | Admitting: Oncology

## 2015-02-22 ENCOUNTER — Other Ambulatory Visit: Payer: Medicare Other

## 2015-02-22 ENCOUNTER — Ambulatory Visit: Payer: Medicare Other

## 2015-02-22 ENCOUNTER — Ambulatory Visit: Payer: Medicare Other | Admitting: Nurse Practitioner

## 2015-02-22 VITALS — BP 123/62 | HR 69 | Temp 97.7°F | Resp 18 | Ht 71.0 in | Wt 236.5 lb

## 2015-02-22 DIAGNOSIS — Z5111 Encounter for antineoplastic chemotherapy: Secondary | ICD-10-CM

## 2015-02-22 DIAGNOSIS — C3411 Malignant neoplasm of upper lobe, right bronchus or lung: Secondary | ICD-10-CM

## 2015-02-22 LAB — CBC WITH DIFFERENTIAL/PLATELET
BASO%: 1.9 % (ref 0.0–2.0)
BASOS ABS: 0.1 10*3/uL (ref 0.0–0.1)
EOS%: 3.1 % (ref 0.0–7.0)
Eosinophils Absolute: 0.1 10*3/uL (ref 0.0–0.5)
HEMATOCRIT: 30.2 % — AB (ref 38.4–49.9)
HGB: 9.7 g/dL — ABNORMAL LOW (ref 13.0–17.1)
LYMPH#: 1 10*3/uL (ref 0.9–3.3)
LYMPH%: 31.6 % (ref 14.0–49.0)
MCH: 27.4 pg (ref 27.2–33.4)
MCHC: 32 g/dL (ref 32.0–36.0)
MCV: 85.5 fL (ref 79.3–98.0)
MONO#: 0.6 10*3/uL (ref 0.1–0.9)
MONO%: 18 % — ABNORMAL HIGH (ref 0.0–14.0)
NEUT#: 1.4 10*3/uL — ABNORMAL LOW (ref 1.5–6.5)
NEUT%: 45.4 % (ref 39.0–75.0)
PLATELETS: 227 10*3/uL (ref 140–400)
RBC: 3.53 10*6/uL — ABNORMAL LOW (ref 4.20–5.82)
RDW: 19.3 % — ABNORMAL HIGH (ref 11.0–14.6)
WBC: 3.1 10*3/uL — ABNORMAL LOW (ref 4.0–10.3)

## 2015-02-22 LAB — COMPREHENSIVE METABOLIC PANEL (CC13)
ALK PHOS: 83 U/L (ref 40–150)
ALT: 12 U/L (ref 0–55)
ANION GAP: 7 meq/L (ref 3–11)
AST: 14 U/L (ref 5–34)
Albumin: 3 g/dL — ABNORMAL LOW (ref 3.5–5.0)
BILIRUBIN TOTAL: 0.31 mg/dL (ref 0.20–1.20)
BUN: 23.1 mg/dL (ref 7.0–26.0)
CALCIUM: 8.7 mg/dL (ref 8.4–10.4)
CHLORIDE: 106 meq/L (ref 98–109)
CO2: 27 mEq/L (ref 22–29)
CREATININE: 1.3 mg/dL (ref 0.7–1.3)
EGFR: 63 mL/min/{1.73_m2} — AB (ref >90)
Glucose: 117 mg/dl (ref 70–140)
POTASSIUM: 3.6 meq/L (ref 3.5–5.1)
Sodium: 141 mEq/L (ref 136–145)
Total Protein: 6.6 g/dL (ref 6.4–8.3)

## 2015-02-22 MED ORDER — FAMOTIDINE IN NACL 20-0.9 MG/50ML-% IV SOLN
INTRAVENOUS | Status: AC
  Filled 2015-02-22: qty 50

## 2015-02-22 MED ORDER — SODIUM CHLORIDE 0.9 % IV SOLN
Freq: Once | INTRAVENOUS | Status: AC
  Administered 2015-02-22: 13:00:00 via INTRAVENOUS
  Filled 2015-02-22: qty 8

## 2015-02-22 MED ORDER — PACLITAXEL CHEMO INJECTION 300 MG/50ML
175.0000 mg/m2 | Freq: Once | INTRAVENOUS | Status: AC
  Administered 2015-02-22: 402 mg via INTRAVENOUS
  Filled 2015-02-22: qty 67

## 2015-02-22 MED ORDER — SODIUM CHLORIDE 0.9 % IV SOLN
477.5000 mg | Freq: Once | INTRAVENOUS | Status: AC
  Administered 2015-02-22: 480 mg via INTRAVENOUS
  Filled 2015-02-22: qty 48

## 2015-02-22 MED ORDER — FAMOTIDINE IN NACL 20-0.9 MG/50ML-% IV SOLN
20.0000 mg | Freq: Once | INTRAVENOUS | Status: AC
  Administered 2015-02-22: 20 mg via INTRAVENOUS

## 2015-02-22 MED ORDER — SODIUM CHLORIDE 0.9 % IV SOLN
Freq: Once | INTRAVENOUS | Status: AC
  Administered 2015-02-22: 13:00:00 via INTRAVENOUS

## 2015-02-22 MED ORDER — DIPHENHYDRAMINE HCL 50 MG/ML IJ SOLN
50.0000 mg | Freq: Once | INTRAMUSCULAR | Status: AC
  Administered 2015-02-22: 50 mg via INTRAVENOUS

## 2015-02-22 MED ORDER — DIPHENHYDRAMINE HCL 50 MG/ML IJ SOLN
INTRAMUSCULAR | Status: AC
  Filled 2015-02-22: qty 1
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
## 2015-02-22 NOTE — Patient Instructions (Signed)
[facility] Discharge Instructions for Patients Receiving Chemotherapy  Today you received the following chemotherapy agents Taxol/Carboplatin To help prevent nausea and vomiting after your treatment, we encourage you to take your nausea medication as prescribed.   If you develop nausea and vomiting that is not controlled by your nausea medication, call the clinic.   BELOW ARE SYMPTOMS THAT SHOULD BE REPORTED IMMEDIATELY:  *FEVER GREATER THAN 100.5 F  *CHILLS WITH OR WITHOUT FEVER  NAUSEA AND VOMITING THAT IS NOT CONTROLLED WITH YOUR NAUSEA MEDICATION  *UNUSUAL SHORTNESS OF BREATH  *UNUSUAL BRUISING OR BLEEDING  TENDERNESS IN MOUTH AND THROAT WITH OR WITHOUT PRESENCE OF ULCERS  *URINARY PROBLEMS  *BOWEL PROBLEMS  UNUSUAL RASH Items with * indicate a potential emergency and should be followed up as soon as possible.  Feel free to call the clinic you have any questions or concerns. The clinic phone number is [phone].  Please show the CHEMO ALERT CARD at check-in to the Emergency Department and triage nurse.   

## 2015-02-24 ENCOUNTER — Ambulatory Visit (HOSPITAL_BASED_OUTPATIENT_CLINIC_OR_DEPARTMENT_OTHER): Payer: Medicare Other

## 2015-02-24 VITALS — BP 115/57 | HR 68 | Temp 98.2°F

## 2015-02-24 DIAGNOSIS — Z5189 Encounter for other specified aftercare: Secondary | ICD-10-CM

## 2015-02-24 DIAGNOSIS — D700 Congenital agranulocytosis: Secondary | ICD-10-CM | POA: Diagnosis not present

## 2015-02-24 DIAGNOSIS — C3411 Malignant neoplasm of upper lobe, right bronchus or lung: Secondary | ICD-10-CM

## 2015-02-24 MED ORDER — PEGFILGRASTIM INJECTION 6 MG/0.6ML ~~LOC~~
6.0000 mg | PREFILLED_SYRINGE | Freq: Once | SUBCUTANEOUS | Status: AC
  Administered 2015-02-24: 6 mg via SUBCUTANEOUS
  Filled 2015-02-24: qty 0.6

## 2015-03-01 ENCOUNTER — Other Ambulatory Visit (HOSPITAL_BASED_OUTPATIENT_CLINIC_OR_DEPARTMENT_OTHER): Payer: Medicare Other

## 2015-03-01 DIAGNOSIS — C3411 Malignant neoplasm of upper lobe, right bronchus or lung: Secondary | ICD-10-CM

## 2015-03-01 LAB — CBC WITH DIFFERENTIAL/PLATELET
BASO%: 0.6 % (ref 0.0–2.0)
BASOS ABS: 0.1 10*3/uL (ref 0.0–0.1)
EOS ABS: 0.2 10*3/uL (ref 0.0–0.5)
EOS%: 1.3 % (ref 0.0–7.0)
HEMATOCRIT: 28.8 % — AB (ref 38.4–49.9)
HEMOGLOBIN: 9.2 g/dL — AB (ref 13.0–17.1)
LYMPH#: 1 10*3/uL (ref 0.9–3.3)
LYMPH%: 6.5 % — ABNORMAL LOW (ref 14.0–49.0)
MCH: 27.8 pg (ref 27.2–33.4)
MCHC: 32.1 g/dL (ref 32.0–36.0)
MCV: 86.6 fL (ref 79.3–98.0)
MONO#: 1.6 10*3/uL — AB (ref 0.1–0.9)
MONO%: 10 % (ref 0.0–14.0)
NEUT#: 13.1 10*3/uL — ABNORMAL HIGH (ref 1.5–6.5)
NEUT%: 81.6 % — ABNORMAL HIGH (ref 39.0–75.0)
PLATELETS: 167 10*3/uL (ref 140–400)
RBC: 3.33 10*6/uL — ABNORMAL LOW (ref 4.20–5.82)
RDW: 17.9 % — AB (ref 11.0–14.6)
WBC: 16.1 10*3/uL — ABNORMAL HIGH (ref 4.0–10.3)

## 2015-03-01 LAB — COMPREHENSIVE METABOLIC PANEL (CC13)
ALBUMIN: 3.2 g/dL — AB (ref 3.5–5.0)
ALK PHOS: 133 U/L (ref 40–150)
ALT: 17 U/L (ref 0–55)
ANION GAP: 7 meq/L (ref 3–11)
AST: 17 U/L (ref 5–34)
BUN: 14.5 mg/dL (ref 7.0–26.0)
CALCIUM: 8.8 mg/dL (ref 8.4–10.4)
CO2: 27 mEq/L (ref 22–29)
CREATININE: 1.1 mg/dL (ref 0.7–1.3)
Chloride: 107 mEq/L (ref 98–109)
EGFR: 73 mL/min/{1.73_m2} — AB (ref >90)
GLUCOSE: 92 mg/dL (ref 70–140)
POTASSIUM: 3.8 meq/L (ref 3.5–5.1)
Sodium: 141 mEq/L (ref 136–145)
Total Protein: 6.6 g/dL (ref 6.4–8.3)

## 2015-03-07 ENCOUNTER — Other Ambulatory Visit: Payer: Self-pay | Admitting: Student in an Organized Health Care Education/Training Program

## 2015-03-08 ENCOUNTER — Other Ambulatory Visit (HOSPITAL_BASED_OUTPATIENT_CLINIC_OR_DEPARTMENT_OTHER): Payer: Medicare Other

## 2015-03-08 DIAGNOSIS — C3492 Malignant neoplasm of unspecified part of left bronchus or lung: Secondary | ICD-10-CM

## 2015-03-08 LAB — CBC WITH DIFFERENTIAL/PLATELET
BASO%: 0.4 % (ref 0.0–2.0)
BASOS ABS: 0 10*3/uL (ref 0.0–0.1)
EOS ABS: 0.2 10*3/uL (ref 0.0–0.5)
EOS%: 2.2 % (ref 0.0–7.0)
HEMATOCRIT: 30.1 % — AB (ref 38.4–49.9)
HEMOGLOBIN: 9.6 g/dL — AB (ref 13.0–17.1)
LYMPH#: 0.9 10*3/uL (ref 0.9–3.3)
LYMPH%: 9.4 % — ABNORMAL LOW (ref 14.0–49.0)
MCH: 28 pg (ref 27.2–33.4)
MCHC: 31.8 g/dL — ABNORMAL LOW (ref 32.0–36.0)
MCV: 87.8 fL (ref 79.3–98.0)
MONO#: 1.2 10*3/uL — AB (ref 0.1–0.9)
MONO%: 12.1 % (ref 0.0–14.0)
NEUT%: 75.9 % — ABNORMAL HIGH (ref 39.0–75.0)
NEUTROS ABS: 7.3 10*3/uL — AB (ref 1.5–6.5)
PLATELETS: 177 10*3/uL (ref 140–400)
RBC: 3.43 10*6/uL — ABNORMAL LOW (ref 4.20–5.82)
RDW: 17.5 % — AB (ref 11.0–14.6)
WBC: 9.6 10*3/uL (ref 4.0–10.3)

## 2015-03-08 LAB — COMPREHENSIVE METABOLIC PANEL (CC13)
ALT: 13 U/L (ref 0–55)
AST: 11 U/L (ref 5–34)
Albumin: 3.1 g/dL — ABNORMAL LOW (ref 3.5–5.0)
Alkaline Phosphatase: 113 U/L (ref 40–150)
Anion Gap: 8 meq/L (ref 3–11)
BUN: 28.3 mg/dL — ABNORMAL HIGH (ref 7.0–26.0)
CO2: 27 meq/L (ref 22–29)
Calcium: 8.5 mg/dL (ref 8.4–10.4)
Chloride: 106 meq/L (ref 98–109)
Creatinine: 2.2 mg/dL — ABNORMAL HIGH (ref 0.7–1.3)
EGFR: 33 ml/min/1.73 m2 — ABNORMAL LOW (ref >90)
Glucose: 107 mg/dL (ref 70–140)
Potassium: 3.8 meq/L (ref 3.5–5.1)
Sodium: 141 meq/L (ref 136–145)
Total Bilirubin: <0.3 mg/dL (ref 0.20–1.20)
Total Protein: 6.5 g/dL (ref 6.4–8.3)

## 2015-03-15 ENCOUNTER — Ambulatory Visit (HOSPITAL_BASED_OUTPATIENT_CLINIC_OR_DEPARTMENT_OTHER): Payer: Medicare Other

## 2015-03-15 ENCOUNTER — Other Ambulatory Visit (HOSPITAL_BASED_OUTPATIENT_CLINIC_OR_DEPARTMENT_OTHER): Payer: Medicare Other

## 2015-03-15 ENCOUNTER — Ambulatory Visit: Payer: Medicare Other

## 2015-03-15 ENCOUNTER — Ambulatory Visit (HOSPITAL_BASED_OUTPATIENT_CLINIC_OR_DEPARTMENT_OTHER): Payer: Medicare Other | Admitting: Internal Medicine

## 2015-03-15 ENCOUNTER — Telehealth: Payer: Self-pay | Admitting: Internal Medicine

## 2015-03-15 ENCOUNTER — Encounter: Payer: Self-pay | Admitting: Internal Medicine

## 2015-03-15 VITALS — BP 151/53 | HR 70 | Temp 98.2°F | Resp 18 | Ht 71.0 in | Wt 235.8 lb

## 2015-03-15 DIAGNOSIS — C3411 Malignant neoplasm of upper lobe, right bronchus or lung: Secondary | ICD-10-CM

## 2015-03-15 DIAGNOSIS — Z5111 Encounter for antineoplastic chemotherapy: Secondary | ICD-10-CM

## 2015-03-15 LAB — CBC WITH DIFFERENTIAL/PLATELET
BASO%: 1 % (ref 0.0–2.0)
BASOS ABS: 0 10*3/uL (ref 0.0–0.1)
EOS%: 4.3 % (ref 0.0–7.0)
Eosinophils Absolute: 0.2 10*3/uL (ref 0.0–0.5)
HEMATOCRIT: 30.7 % — AB (ref 38.4–49.9)
HGB: 9.7 g/dL — ABNORMAL LOW (ref 13.0–17.1)
LYMPH#: 0.8 10*3/uL — AB (ref 0.9–3.3)
LYMPH%: 19.7 % (ref 14.0–49.0)
MCH: 27.8 pg (ref 27.2–33.4)
MCHC: 31.7 g/dL — AB (ref 32.0–36.0)
MCV: 87.8 fL (ref 79.3–98.0)
MONO#: 1 10*3/uL — ABNORMAL HIGH (ref 0.1–0.9)
MONO%: 24.7 % — ABNORMAL HIGH (ref 0.0–14.0)
NEUT#: 2.1 10*3/uL (ref 1.5–6.5)
NEUT%: 50.3 % (ref 39.0–75.0)
PLATELETS: 189 10*3/uL (ref 140–400)
RBC: 3.5 10*6/uL — AB (ref 4.20–5.82)
RDW: 16.1 % — ABNORMAL HIGH (ref 11.0–14.6)
WBC: 4.1 10*3/uL (ref 4.0–10.3)

## 2015-03-15 LAB — COMPREHENSIVE METABOLIC PANEL (CC13)
ALT: 13 U/L (ref 0–55)
ANION GAP: 9 meq/L (ref 3–11)
AST: 12 U/L (ref 5–34)
Albumin: 3.1 g/dL — ABNORMAL LOW (ref 3.5–5.0)
Alkaline Phosphatase: 95 U/L (ref 40–150)
BUN: 22.4 mg/dL (ref 7.0–26.0)
CALCIUM: 9 mg/dL (ref 8.4–10.4)
CHLORIDE: 105 meq/L (ref 98–109)
CO2: 28 mEq/L (ref 22–29)
Creatinine: 1.6 mg/dL — ABNORMAL HIGH (ref 0.7–1.3)
EGFR: 49 mL/min/{1.73_m2} — ABNORMAL LOW (ref >90)
Glucose: 110 mg/dl (ref 70–140)
POTASSIUM: 3.9 meq/L (ref 3.5–5.1)
Sodium: 142 mEq/L (ref 136–145)
Total Bilirubin: <0.3 mg/dL (ref 0.20–1.20)
Total Protein: 6.7 g/dL (ref 6.4–8.3)

## 2015-03-15 MED ORDER — SODIUM CHLORIDE 0.9 % IV SOLN
Freq: Once | INTRAVENOUS | Status: AC
  Administered 2015-03-15: 14:00:00 via INTRAVENOUS
  Filled 2015-03-15: qty 8

## 2015-03-15 MED ORDER — SODIUM CHLORIDE 0.9 % IV SOLN
Freq: Once | INTRAVENOUS | Status: AC
  Administered 2015-03-15: 13:00:00 via INTRAVENOUS

## 2015-03-15 MED ORDER — PACLITAXEL CHEMO INJECTION 300 MG/50ML
175.0000 mg/m2 | Freq: Once | INTRAVENOUS | Status: AC
  Administered 2015-03-15: 402 mg via INTRAVENOUS
  Filled 2015-03-15: qty 67

## 2015-03-15 MED ORDER — FAMOTIDINE IN NACL 20-0.9 MG/50ML-% IV SOLN
INTRAVENOUS | Status: AC
  Filled 2015-03-15: qty 50

## 2015-03-15 MED ORDER — FAMOTIDINE IN NACL 20-0.9 MG/50ML-% IV SOLN
20.0000 mg | Freq: Once | INTRAVENOUS | Status: AC
  Administered 2015-03-15: 20 mg via INTRAVENOUS

## 2015-03-15 MED ORDER — DIPHENHYDRAMINE HCL 50 MG/ML IJ SOLN
50.0000 mg | Freq: Once | INTRAMUSCULAR | Status: AC
  Administered 2015-03-15: 50 mg via INTRAVENOUS

## 2015-03-15 MED ORDER — DIPHENHYDRAMINE HCL 50 MG/ML IJ SOLN
INTRAMUSCULAR | Status: AC
  Filled 2015-03-15: qty 1

## 2015-03-15 MED ORDER — SODIUM CHLORIDE 0.9 % IV SOLN
400.0000 mg | Freq: Once | INTRAVENOUS | Status: AC
  Administered 2015-03-15: 400 mg via INTRAVENOUS
  Filled 2015-03-15: qty 40

## 2015-03-15 NOTE — Progress Notes (Signed)
Per Julien Nordmann it is okay to treat pt today with chemo and today lab results.
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
## 2015-03-15 NOTE — Patient Instructions (Signed)
[facility] Discharge Instructions for Patients Receiving Chemotherapy  Today you received the following chemotherapy agents TAXOL CARBO To help prevent nausea and vomiting after your treatment, we encourage you to take your nausea medication as prescribed.   If you develop nausea and vomiting that is not controlled by your nausea medication, call the clinic.   BELOW ARE SYMPTOMS THAT SHOULD BE REPORTED IMMEDIATELY:  *FEVER GREATER THAN 100.5 F  *CHILLS WITH OR WITHOUT FEVER  NAUSEA AND VOMITING THAT IS NOT CONTROLLED WITH YOUR NAUSEA MEDICATION  *UNUSUAL SHORTNESS OF BREATH  *UNUSUAL BRUISING OR BLEEDING  TENDERNESS IN MOUTH AND THROAT WITH OR WITHOUT PRESENCE OF ULCERS  *URINARY PROBLEMS  *BOWEL PROBLEMS  UNUSUAL RASH Items with * indicate a potential emergency and should be followed up as soon as possible.  Feel free to call the clinic you have any questions or concerns. The clinic phone number is [phone].  Please show the [facility] at check-in to the Emergency Department and triage nurse.

## 2015-03-15 NOTE — Telephone Encounter (Signed)
Gave and printed appt sched and avs for pt for NOV and DEc

## 2015-03-17 ENCOUNTER — Ambulatory Visit (HOSPITAL_BASED_OUTPATIENT_CLINIC_OR_DEPARTMENT_OTHER): Payer: Medicare Other

## 2015-03-17 VITALS — BP 120/59 | HR 75 | Temp 98.0°F

## 2015-03-17 DIAGNOSIS — C3411 Malignant neoplasm of upper lobe, right bronchus or lung: Secondary | ICD-10-CM

## 2015-03-17 DIAGNOSIS — Z5189 Encounter for other specified aftercare: Secondary | ICD-10-CM

## 2015-03-17 MED ORDER — PEGFILGRASTIM INJECTION 6 MG/0.6ML ~~LOC~~
6.0000 mg | PREFILLED_SYRINGE | Freq: Once | SUBCUTANEOUS | Status: AC
  Administered 2015-03-17: 6 mg via SUBCUTANEOUS
  Filled 2015-03-17: qty 0.6

## 2015-03-22 ENCOUNTER — Other Ambulatory Visit (HOSPITAL_BASED_OUTPATIENT_CLINIC_OR_DEPARTMENT_OTHER): Payer: Medicare Other

## 2015-03-22 DIAGNOSIS — C3411 Malignant neoplasm of upper lobe, right bronchus or lung: Secondary | ICD-10-CM | POA: Diagnosis not present

## 2015-03-22 LAB — CBC WITH DIFFERENTIAL/PLATELET
BASO%: 0.2 % (ref 0.0–2.0)
Basophils Absolute: 0 10*3/uL (ref 0.0–0.1)
EOS ABS: 0.3 10*3/uL (ref 0.0–0.5)
EOS%: 2.1 % (ref 0.0–7.0)
HCT: 31.5 % — ABNORMAL LOW (ref 38.4–49.9)
HEMOGLOBIN: 10 g/dL — AB (ref 13.0–17.1)
LYMPH%: 9.5 % — ABNORMAL LOW (ref 14.0–49.0)
MCH: 28.4 pg (ref 27.2–33.4)
MCHC: 31.7 g/dL — ABNORMAL LOW (ref 32.0–36.0)
MCV: 89.5 fL (ref 79.3–98.0)
MONO#: 1.4 10*3/uL — AB (ref 0.1–0.9)
MONO%: 10.7 % (ref 0.0–14.0)
NEUT%: 77.5 % — ABNORMAL HIGH (ref 39.0–75.0)
NEUTROS ABS: 10.2 10*3/uL — AB (ref 1.5–6.5)
PLATELETS: 168 10*3/uL (ref 140–400)
RBC: 3.52 10*6/uL — ABNORMAL LOW (ref 4.20–5.82)
RDW: 15.2 % — AB (ref 11.0–14.6)
WBC: 13.1 10*3/uL — AB (ref 4.0–10.3)
lymph#: 1.2 10*3/uL (ref 0.9–3.3)

## 2015-03-22 LAB — COMPREHENSIVE METABOLIC PANEL (CC13)
ALBUMIN: 3.3 g/dL — AB (ref 3.5–5.0)
ALK PHOS: 123 U/L (ref 40–150)
ALT: 15 U/L (ref 0–55)
ANION GAP: 7 meq/L (ref 3–11)
AST: 16 U/L (ref 5–34)
BUN: 23.6 mg/dL (ref 7.0–26.0)
CO2: 25 meq/L (ref 22–29)
CREATININE: 1.2 mg/dL (ref 0.7–1.3)
Calcium: 8.9 mg/dL (ref 8.4–10.4)
Chloride: 108 mEq/L (ref 98–109)
EGFR: 67 mL/min/{1.73_m2} — AB (ref >90)
GLUCOSE: 103 mg/dL (ref 70–140)
Potassium: 3.9 mEq/L (ref 3.5–5.1)
SODIUM: 140 meq/L (ref 136–145)
TOTAL PROTEIN: 6.7 g/dL (ref 6.4–8.3)

## 2015-03-24 ENCOUNTER — Telehealth: Payer: Self-pay | Admitting: Internal Medicine

## 2015-03-24 ENCOUNTER — Encounter: Payer: Self-pay | Admitting: Internal Medicine

## 2015-03-24 ENCOUNTER — Ambulatory Visit (INDEPENDENT_AMBULATORY_CARE_PROVIDER_SITE_OTHER): Payer: Medicare Other | Admitting: Internal Medicine

## 2015-03-24 ENCOUNTER — Ambulatory Visit (HOSPITAL_COMMUNITY)
Admission: RE | Admit: 2015-03-24 | Discharge: 2015-03-24 | Disposition: A | Discharge status: Home / Self Care | Payer: Medicare Other | Source: Ambulatory Visit | Attending: Internal Medicine | Admitting: Internal Medicine

## 2015-03-24 VITALS — BP 114/51 | HR 72 | Temp 98.2°F | Ht 71.0 in | Wt 237.0 lb

## 2015-03-24 DIAGNOSIS — R05 Cough: Secondary | ICD-10-CM

## 2015-03-24 DIAGNOSIS — Z87891 Personal history of nicotine dependence: Secondary | ICD-10-CM

## 2015-03-24 DIAGNOSIS — M159 Polyosteoarthritis, unspecified: Secondary | ICD-10-CM

## 2015-03-24 DIAGNOSIS — Z09 Encounter for follow-up examination after completed treatment for conditions other than malignant neoplasm: Secondary | ICD-10-CM

## 2015-03-24 DIAGNOSIS — L0291 Cutaneous abscess, unspecified: Secondary | ICD-10-CM

## 2015-03-24 DIAGNOSIS — R059 Cough, unspecified: Secondary | ICD-10-CM

## 2015-03-24 DIAGNOSIS — E559 Vitamin D deficiency, unspecified: Secondary | ICD-10-CM

## 2015-03-24 DIAGNOSIS — Z9221 Personal history of antineoplastic chemotherapy: Secondary | ICD-10-CM

## 2015-03-24 DIAGNOSIS — R911 Solitary pulmonary nodule: Secondary | ICD-10-CM | POA: Insufficient documentation

## 2015-03-24 DIAGNOSIS — C3411 Malignant neoplasm of upper lobe, right bronchus or lung: Secondary | ICD-10-CM | POA: Diagnosis not present

## 2015-03-24 DIAGNOSIS — I10 Essential (primary) hypertension: Secondary | ICD-10-CM

## 2015-03-24 DIAGNOSIS — M150 Primary generalized (osteo)arthritis: Secondary | ICD-10-CM

## 2015-03-24 MED ORDER — DICLOFENAC SODIUM 1 % TD GEL: TRANSDERMAL | Status: DC

## 2015-03-24 MED ORDER — GUAIFENESIN ER 600 MG PO TB12: 600.0000 mg | ORAL_TABLET | Freq: Two times a day (BID) | ORAL | Status: DC

## 2015-03-24 MED ORDER — BENZONATATE 100 MG PO CAPS: 100.0000 mg | ORAL_CAPSULE | Freq: Four times a day (QID) | ORAL | Status: DC | PRN

## 2015-03-24 MED ORDER — HYDROCODONE-ACETAMINOPHEN 10-325 MG PO TABS: 1.0000 | ORAL_TABLET | Freq: Four times a day (QID) | ORAL | Status: DC | PRN

## 2015-03-24 NOTE — Assessment & Plan Note (Signed)
Stage IIIA non-small cell adenocarcinoma (diagnosed 09/2014): consolidation chemo with carboplatin and paclitaxel. s/p 2 cycles.   Followed by oncologist Dr. Julien Nordmann. He is scheduled for CT chest for restaging scheudled on 12/2.

## 2015-03-24 NOTE — Assessment & Plan Note (Signed)
BP Readings from Last 3 Encounters:  03/24/15 114/51  03/17/15 120/59  03/15/15 151/53    Lab Results  Component Value Date   NA 140 03/22/2015   K 3.9 03/22/2015   CREATININE 1.2 03/22/2015    Assessment: Blood pressure control:  well controlled\ Comments: Currently on HCTZ 12.'5mg'$  daily, carvedilol 25 mg bid and enalapril 40 mg daily. Reports compliance.  Plan: Medications:  continue current medications Educational resources provided:   Self management tools provided:   Other plans: BP is well controlled on current medications. Will continue current doses.

## 2015-03-24 NOTE — Assessment & Plan Note (Signed)
Nicholas [name] is on chronic narcotics and NSAIDs 2/2 osteoarthritis is multiple joints. He reports his pain is well controlledon his current regimen. He reports to me today that he takes 3 of his norco 10-325 pills in the morning and this controls his pain for the rest of the day. Denies needing any additional norco throughout the day. Does occasionally use the mobic and voltaren gel with some relief.   Discussed spreading out the norco throughout the day and not taking it all at once. He seemed resistant to this idea and will need further follow up in the future. Discussed the dangers of taking such a high dose at one time. Reluctantly agreed to space out his doses.   -Refilled his norco 10-325 mg q6hr prn #120 per pain contract -D/C meloxicam given renal dysfunction and concurrent Voltaren gel use -Continue voltaren gel tid prn

## 2015-03-24 NOTE — Telephone Encounter (Signed)
Spoke with patient, advised that medicaid will not pay for medication that can be purchased OTC.  PT is working on getting some money to get it OTC.  I told pt to ask pharmacist at [facility] (or [facility] for cheaper options) to show him the OTC store brand equivalent so that he can get the most cost effective choice.

## 2015-03-24 NOTE — Assessment & Plan Note (Signed)
Abscess on left arm is well healed with some scarring present. Denies any pain. No erythema or warmth present.

## 2015-03-24 NOTE — Patient Instructions (Signed)
Thank you for coming  -I am going to get a chest x ray today. I will call you with the results -I sent in some cough medication for you -I am checking some lab work today -Please stop taking the mobic and use the voltaren gel as needed -follow up with me in 3 months or sooner as needed

## 2015-03-24 NOTE — Assessment & Plan Note (Signed)
Nicholas [name] presents today with 2 week history of cough. He reports that he has had sputum production (patient is blind so color of sputum is unknown). Denies any fevers, chills, shortness of breath, chest pain or wheezing. Temperature today is 98.2 F. He gets weekly labs as he is on chemotherapy (s/p 2 rounds of carboplatin and paclitaxel). White count was mildly elevated on 11/14 at 13.1 but his wbc has varied wildly while on the chemotherapy. Daughter with him today does note that his voice is hoarse. He states that the cough is improving and he tried Nyquill with some improvement.   Given his immunocompromised state checked a CXR today. IMPRESSION No acute cardiopulmonary disease. Spiculated right upper lobe nodule consistent with residual Malignancy.  No consolidations concerning for PNA. Most likely a viral URI that is resolving on its own. Will give Mucinex today. Told him to call the clinic if he has any fevers, chills, worsening symptoms.

## 2015-03-24 NOTE — Assessment & Plan Note (Signed)
Rechecking vitamin D level today after completion of therapy
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
## 2015-03-24 NOTE — Telephone Encounter (Addendum)
Patient states that Medicaid will not approve his Mucinex per his pharmacy.  Patient would like a call back today if possible.

## 2015-03-24 NOTE — Addendum Note (Signed)
Addended by: [place] on: 03/24/2015 03:44 PM   Modules accepted: Orders

## 2015-03-25 LAB — VITAMIN D 25 HYDROXY (VIT D DEFICIENCY, FRACTURES): Vit D, 25-Hydroxy: 19.8 ng/mL — ABNORMAL LOW (ref 30.0–100.0)

## 2015-03-25 NOTE — Progress Notes (Signed)
Internal Medicine Clinic Attending  I saw and evaluated the patient.  I personally confirmed the key portions of the history and exam documented by Dr. Boswell and I reviewed pertinent patient test results.  The assessment, diagnosis, and plan were formulated together and I agree with the documentation in the resident's note. 

## 2015-03-29 ENCOUNTER — Other Ambulatory Visit (HOSPITAL_BASED_OUTPATIENT_CLINIC_OR_DEPARTMENT_OTHER): Payer: Medicare Other

## 2015-03-29 DIAGNOSIS — C3411 Malignant neoplasm of upper lobe, right bronchus or lung: Secondary | ICD-10-CM

## 2015-03-29 LAB — COMPREHENSIVE METABOLIC PANEL (CC13)
ALT: 12 U/L (ref 0–55)
AST: 13 U/L (ref 5–34)
Albumin: 3.1 g/dL — ABNORMAL LOW (ref 3.5–5.0)
Alkaline Phosphatase: 128 U/L (ref 40–150)
Anion Gap: 9 mEq/L (ref 3–11)
BUN: 11.2 mg/dL (ref 7.0–26.0)
CALCIUM: 8.8 mg/dL (ref 8.4–10.4)
CHLORIDE: 104 meq/L (ref 98–109)
CO2: 26 mEq/L (ref 22–29)
Creatinine: 0.9 mg/dL (ref 0.7–1.3)
EGFR: 89 mL/min/{1.73_m2} — AB (ref >90)
GLUCOSE: 96 mg/dL (ref 70–140)
POTASSIUM: 3.9 meq/L (ref 3.5–5.1)
SODIUM: 139 meq/L (ref 136–145)
Total Bilirubin: <0.3 mg/dL (ref 0.20–1.20)
Total Protein: 6.9 g/dL (ref 6.4–8.3)

## 2015-03-29 LAB — CBC WITH DIFFERENTIAL/PLATELET
BASO%: 0.5 % (ref 0.0–2.0)
BASOS ABS: 0 10*3/uL (ref 0.0–0.1)
EOS ABS: 0.2 10*3/uL (ref 0.0–0.5)
EOS%: 1.6 % (ref 0.0–7.0)
HEMATOCRIT: 30.2 % — AB (ref 38.4–49.9)
HGB: 9.9 g/dL — ABNORMAL LOW (ref 13.0–17.1)
LYMPH#: 0.9 10*3/uL (ref 0.9–3.3)
LYMPH%: 9.8 % — ABNORMAL LOW (ref 14.0–49.0)
MCH: 28.8 pg (ref 27.2–33.4)
MCHC: 32.8 g/dL (ref 32.0–36.0)
MCV: 87.9 fL (ref 79.3–98.0)
MONO#: 0.9 10*3/uL (ref 0.1–0.9)
MONO%: 9.4 % (ref 0.0–14.0)
NEUT#: 7.6 10*3/uL — ABNORMAL HIGH (ref 1.5–6.5)
NEUT%: 78.7 % — AB (ref 39.0–75.0)
PLATELETS: 230 10*3/uL (ref 140–400)
RBC: 3.43 10*6/uL — ABNORMAL LOW (ref 4.20–5.82)
RDW: 15.6 % — ABNORMAL HIGH (ref 11.0–14.6)
WBC: 9.6 10*3/uL (ref 4.0–10.3)

## 2015-04-03 ENCOUNTER — Other Ambulatory Visit: Payer: Self-pay | Admitting: Internal Medicine

## 2015-04-05 ENCOUNTER — Other Ambulatory Visit (HOSPITAL_BASED_OUTPATIENT_CLINIC_OR_DEPARTMENT_OTHER): Payer: Medicare Other

## 2015-04-05 DIAGNOSIS — C3492 Malignant neoplasm of unspecified part of left bronchus or lung: Secondary | ICD-10-CM | POA: Diagnosis not present

## 2015-04-05 LAB — CBC WITH DIFFERENTIAL/PLATELET
BASO%: 0.4 % (ref 0.0–2.0)
Basophils Absolute: 0 10*3/uL (ref 0.0–0.1)
EOS ABS: 0.1 10*3/uL (ref 0.0–0.5)
EOS%: 1.4 % (ref 0.0–7.0)
HCT: 30.6 % — ABNORMAL LOW (ref 38.4–49.9)
HEMOGLOBIN: 9.9 g/dL — AB (ref 13.0–17.1)
LYMPH%: 14.5 % (ref 14.0–49.0)
MCH: 28.7 pg (ref 27.2–33.4)
MCHC: 32.3 g/dL (ref 32.0–36.0)
MCV: 88.9 fL (ref 79.3–98.0)
MONO#: 1 10*3/uL — AB (ref 0.1–0.9)
MONO%: 17 % — ABNORMAL HIGH (ref 0.0–14.0)
NEUT%: 66.7 % (ref 39.0–75.0)
NEUTROS ABS: 3.9 10*3/uL (ref 1.5–6.5)
PLATELETS: 241 10*3/uL (ref 140–400)
RBC: 3.44 10*6/uL — AB (ref 4.20–5.82)
RDW: 15.4 % — AB (ref 11.0–14.6)
WBC: 5.9 10*3/uL (ref 4.0–10.3)
lymph#: 0.9 10*3/uL (ref 0.9–3.3)

## 2015-04-05 LAB — COMPREHENSIVE METABOLIC PANEL (CC13)
ANION GAP: 8 meq/L (ref 3–11)
AST: 12 U/L (ref 5–34)
Albumin: 2.7 g/dL — ABNORMAL LOW (ref 3.5–5.0)
Alkaline Phosphatase: 80 U/L (ref 40–150)
BUN: 14.5 mg/dL (ref 7.0–26.0)
CALCIUM: 8.8 mg/dL (ref 8.4–10.4)
CHLORIDE: 104 meq/L (ref 98–109)
CO2: 29 mEq/L (ref 22–29)
CREATININE: 1.2 mg/dL (ref 0.7–1.3)
EGFR: 70 mL/min/{1.73_m2} — AB (ref >90)
Glucose: 88 mg/dl (ref 70–140)
POTASSIUM: 4.3 meq/L (ref 3.5–5.1)
SODIUM: 141 meq/L (ref 136–145)
Total Bilirubin: 0.3 mg/dL (ref 0.20–1.20)
Total Protein: 7.2 g/dL (ref 6.4–8.3)

## 2015-04-09 ENCOUNTER — Encounter (HOSPITAL_COMMUNITY): Payer: Self-pay

## 2015-04-09 ENCOUNTER — Ambulatory Visit (HOSPITAL_COMMUNITY)
Admission: RE | Admit: 2015-04-09 | Discharge: 2015-04-09 | Disposition: A | Discharge status: Home / Self Care | Payer: Medicare Other | Source: Ambulatory Visit | Attending: Internal Medicine | Admitting: Internal Medicine

## 2015-04-09 DIAGNOSIS — C3411 Malignant neoplasm of upper lobe, right bronchus or lung: Secondary | ICD-10-CM | POA: Insufficient documentation

## 2015-04-09 DIAGNOSIS — R590 Localized enlarged lymph nodes: Secondary | ICD-10-CM | POA: Insufficient documentation

## 2015-04-09 DIAGNOSIS — E279 Disorder of adrenal gland, unspecified: Secondary | ICD-10-CM | POA: Insufficient documentation

## 2015-04-09 DIAGNOSIS — C3490 Malignant neoplasm of unspecified part of unspecified bronchus or lung: Secondary | ICD-10-CM | POA: Diagnosis not present

## 2015-04-09 MED ORDER — IOHEXOL 300 MG/ML  SOLN
75.0000 mL | Freq: Once | INTRAMUSCULAR | Status: AC | PRN
  Administered 2015-04-09: 75 mL via INTRAVENOUS

## 2015-04-12 ENCOUNTER — Ambulatory Visit (HOSPITAL_BASED_OUTPATIENT_CLINIC_OR_DEPARTMENT_OTHER): Payer: Medicare Other | Admitting: Internal Medicine

## 2015-04-12 ENCOUNTER — Other Ambulatory Visit (HOSPITAL_BASED_OUTPATIENT_CLINIC_OR_DEPARTMENT_OTHER): Payer: Medicare Other

## 2015-04-12 ENCOUNTER — Encounter: Payer: Self-pay | Admitting: Internal Medicine

## 2015-04-12 ENCOUNTER — Telehealth: Payer: Self-pay | Admitting: Internal Medicine

## 2015-04-12 VITALS — BP 141/66 | HR 65 | Temp 98.7°F | Resp 18 | Ht 71.0 in | Wt 236.4 lb

## 2015-04-12 DIAGNOSIS — C3411 Malignant neoplasm of upper lobe, right bronchus or lung: Secondary | ICD-10-CM

## 2015-04-12 LAB — CBC WITH DIFFERENTIAL/PLATELET
BASO%: 0.7 % (ref 0.0–2.0)
BASOS ABS: 0 10*3/uL (ref 0.0–0.1)
EOS%: 3.5 % (ref 0.0–7.0)
Eosinophils Absolute: 0.1 10*3/uL (ref 0.0–0.5)
HEMATOCRIT: 29.1 % — AB (ref 38.4–49.9)
HGB: 9.3 g/dL — ABNORMAL LOW (ref 13.0–17.1)
LYMPH%: 17.4 % (ref 14.0–49.0)
MCH: 28.3 pg (ref 27.2–33.4)
MCHC: 32.1 g/dL (ref 32.0–36.0)
MCV: 88 fL (ref 79.3–98.0)
MONO#: 0.7 10*3/uL (ref 0.1–0.9)
MONO%: 15.8 % — ABNORMAL HIGH (ref 0.0–14.0)
NEUT#: 2.6 10*3/uL (ref 1.5–6.5)
NEUT%: 62.6 % (ref 39.0–75.0)
Platelets: 314 10*3/uL (ref 140–400)
RBC: 3.3 10*6/uL — AB (ref 4.20–5.82)
RDW: 15.4 % — ABNORMAL HIGH (ref 11.0–14.6)
WBC: 4.1 10*3/uL (ref 4.0–10.3)
lymph#: 0.7 10*3/uL — ABNORMAL LOW (ref 0.9–3.3)

## 2015-04-12 LAB — COMPREHENSIVE METABOLIC PANEL
ALT: 12 U/L (ref 0–55)
ANION GAP: 7 meq/L (ref 3–11)
AST: 16 U/L (ref 5–34)
Albumin: 2.9 g/dL — ABNORMAL LOW (ref 3.5–5.0)
Alkaline Phosphatase: 83 U/L (ref 40–150)
BUN: 14.6 mg/dL (ref 7.0–26.0)
CALCIUM: 8.8 mg/dL (ref 8.4–10.4)
CHLORIDE: 105 meq/L (ref 98–109)
CO2: 26 meq/L (ref 22–29)
Creatinine: 1.1 mg/dL (ref 0.7–1.3)
EGFR: 72 mL/min/{1.73_m2} — ABNORMAL LOW (ref >90)
Glucose: 113 mg/dl (ref 70–140)
POTASSIUM: 4.3 meq/L (ref 3.5–5.1)
Sodium: 139 mEq/L (ref 136–145)
Total Bilirubin: <0.3 mg/dL (ref 0.20–1.20)
Total Protein: 7.4 g/dL (ref 6.4–8.3)
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
## 2015-04-12 NOTE — Telephone Encounter (Signed)
per pof to sch pt appt-gave pt copy of avs-adv central sch willc allto sch pt trmt

## 2015-05-05 ENCOUNTER — Telehealth: Payer: Self-pay | Admitting: *Deleted

## 2015-05-05 NOTE — Telephone Encounter (Signed)
FYI Patient and daughter called reporting "s.o.b when he lies down at night and when he wakes up.  S.O.B x three weeks now.  Does not wear oxygen but has oxygen his friend Mariann Laster wears.  Denies fever.  Did not check during this call.  Dull dry cough that when wet he spits but no one has seen what he spits phlegm.  He is blind.  Denies tessalon pearls from Dr. Charlynn Grimes and did not spend $35.00 for Mucinex.  Previously used cough syrup with cold/flu but none with lingering cough.  Daughter says she will call PCP Dr. Charlynn Grimes.  Will notify Dr. Julien Nordmann of this call.

## 2015-05-27 ENCOUNTER — Ambulatory Visit (HOSPITAL_COMMUNITY)
Admission: RE | Admit: 2015-05-27 | Discharge: 2015-05-27 | Disposition: A | Discharge status: Home / Self Care | Payer: Medicare Other | Source: Ambulatory Visit | Attending: Internal Medicine | Admitting: Internal Medicine

## 2015-05-27 ENCOUNTER — Ambulatory Visit (INDEPENDENT_AMBULATORY_CARE_PROVIDER_SITE_OTHER): Payer: Medicare Other | Admitting: Internal Medicine

## 2015-05-27 ENCOUNTER — Ambulatory Visit (HOSPITAL_COMMUNITY): Payer: Medicare Other

## 2015-05-27 ENCOUNTER — Encounter: Payer: Self-pay | Admitting: Internal Medicine

## 2015-05-27 VITALS — BP 138/56 | HR 73 | Temp 98.0°F | Ht 71.0 in | Wt 232.5 lb

## 2015-05-27 DIAGNOSIS — M47896 Other spondylosis, lumbar region: Secondary | ICD-10-CM | POA: Insufficient documentation

## 2015-05-27 DIAGNOSIS — J9601 Acute respiratory failure with hypoxia: Secondary | ICD-10-CM | POA: Diagnosis not present

## 2015-05-27 DIAGNOSIS — M25551 Pain in right hip: Secondary | ICD-10-CM

## 2015-05-27 DIAGNOSIS — R058 Other specified cough: Secondary | ICD-10-CM

## 2015-05-27 DIAGNOSIS — M1611 Unilateral primary osteoarthritis, right hip: Secondary | ICD-10-CM

## 2015-05-27 DIAGNOSIS — R05 Cough: Secondary | ICD-10-CM | POA: Insufficient documentation

## 2015-05-27 DIAGNOSIS — M150 Primary generalized (osteo)arthritis: Secondary | ICD-10-CM

## 2015-05-27 DIAGNOSIS — G8929 Other chronic pain: Secondary | ICD-10-CM | POA: Diagnosis not present

## 2015-05-27 DIAGNOSIS — M159 Polyosteoarthritis, unspecified: Secondary | ICD-10-CM

## 2015-05-27 DIAGNOSIS — R0602 Shortness of breath: Secondary | ICD-10-CM | POA: Insufficient documentation

## 2015-05-27 DIAGNOSIS — Z9981 Dependence on supplemental oxygen: Secondary | ICD-10-CM

## 2015-05-27 DIAGNOSIS — I517 Cardiomegaly: Secondary | ICD-10-CM | POA: Insufficient documentation

## 2015-05-27 DIAGNOSIS — I5032 Chronic diastolic (congestive) heart failure: Secondary | ICD-10-CM

## 2015-05-27 MED ORDER — FUROSEMIDE 20 MG PO TABS: 20.0000 mg | ORAL_TABLET | Freq: Every day | ORAL | Status: DC

## 2015-05-27 MED ORDER — HYDROCODONE-ACETAMINOPHEN 10-325 MG PO TABS: 1.0000 | ORAL_TABLET | Freq: Four times a day (QID) | ORAL | Status: DC | PRN

## 2015-05-27 NOTE — Patient Instructions (Addendum)
General Instructions:  I want you to take Lasix '20mg'$  each morning and see me early next week. Call or go to the ED for worsening shortness of breath.  I want you to use 2L of oxygen at home.  Thank you for bringing your medicines today. This helps [place] keep you safe from mistakes.   Progress Toward Treatment Goals:  Treatment Goal 10/07/2014  Blood pressure unchanged    Self Care Goals & Plans:  Self Care Goal 05/27/2015  Manage my medications take my medicines as prescribed; bring my medications to every visit; refill my medications on time  Monitor my health -  Eat healthy foods eat more vegetables; eat foods that are low in salt; eat baked foods instead of fried foods  Be physically active find an activity I enjoy  Meeting treatment goals -    No flowsheet data found.   Care Management & Community Referrals:  Referral 09/09/2013  Referrals made for care management support none needed  Referrals made to community resources -
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
## 2015-05-28 DIAGNOSIS — J9601 Acute respiratory failure with hypoxia: Secondary | ICD-10-CM | POA: Insufficient documentation

## 2015-05-28 NOTE — Assessment & Plan Note (Signed)
A: Primary OA for right hip  P: Given his worsened pain and active lung cancer I obtained a Xray of his pelvis to evaluate for metastases.  There is no evidence of mets but he does have evidence of DDD of the lumbar spine and OA of his hip. - Refilled Hydrocodone and encouraged patient to take Q6hours PRN.

## 2015-05-28 NOTE — Assessment & Plan Note (Signed)
A: 81 year old male presenting with productive cough and hypoxic on room air.  Acute respiratory failure with hypoxia  P:  Initially I was concerned for a CAP versus a post obstructive PNA due to his Hx of Cancer and obtained a STAT CXR.  I reviewed the CXR personally with Dr Eppie Gibson, he does have some minor apical scarring/ateletcisis but no visible sign of a PNA that would cause his hypoxia.  There is also no evidence of a post radiation or chemotherapy inflamation and does not explain his hypoxia. - He is a low risk for a PE with his main risk factors being his active malignancy and age (Revised [place] Sledge of 3), however I suspect his D-dimer will be elevate and ordered a STAT CTA to evaluate for PE.  After discussion with radiology there would be unable to obtain this until 2pm and MR Postle became frustrated and wished to go home.  I advised that he is at risk for a life threatening condition that the CTA would be able to diagnose.  The other possibility would by that this is being caused by his dCHF, and we could try a trial of Lasix, he wish to proceede with this.  I will give him lasix '20mg'$  daily and have him follow up early next week.  If he is improved we will need to repeat a echocardiogram at that time.  I have given him return precautions and advised that he needs to go to the ED if he worsens.  I have also asked him to continue supplemental Oxygen at 2L per minute till he follows up with me.

## 2015-05-31 NOTE — Addendum Note (Signed)
Addended by: Oval Linsey D on: 05/31/2015 04:29 PM   Modules accepted: Level of Service

## 2015-05-31 NOTE — Progress Notes (Signed)
I saw and evaluated the patient.  I personally confirmed the key portions of Dr. Jodene Nam history and exam and reviewed pertinent patient test results.  The assessment, diagnosis, and plan were formulated together and I agree with the documentation in the resident's note.  I personally reviewed the CXR and the pelvic/hip films with Dr. He[facility] [place] [facility].  One explanation for his SOB could by a platinum associated cardiomyopathy, hence the reason for the empiric diuretics and consideration for an Echo if he were to improve symptomatically.  He refused to wait for the CT angiogram despite understanding the risks of missing a diagnosis of PE and preferred to try the diuretics first.  He will be closely followed up within the week.

## 2015-06-09 ENCOUNTER — Other Ambulatory Visit: Payer: Self-pay | Admitting: Internal Medicine

## 2015-06-09 NOTE — Telephone Encounter (Signed)
Patient would like a Lidocaine Cream for her back pain.

## 2015-06-09 NOTE — Telephone Encounter (Signed)
i spoke with pt since he has refills on voltaren gel and lidocaine patches are available OTC.  Also of note patient was supposed to be seen after his last OV and hasn't returned.  Pt added to Dr. Ronnald Ramp schedule for Friday at 10:15.  Pt reports he has the volteran gel on hand, he has ordered a back brace and says cream is maybe coming with that.  I told him I was unsure about the brace and hadn't heard of lidocaine cream being used with a brace.  Pt to follow up with the brace representatives.

## 2015-06-11 ENCOUNTER — Encounter: Payer: Self-pay | Admitting: Internal Medicine

## 2015-06-11 ENCOUNTER — Ambulatory Visit (INDEPENDENT_AMBULATORY_CARE_PROVIDER_SITE_OTHER): Payer: Medicare Other | Admitting: Internal Medicine

## 2015-06-11 VITALS — BP 145/57 | HR 65 | Temp 98.3°F | Ht 71.0 in | Wt 240.8 lb

## 2015-06-11 DIAGNOSIS — Z9911 Dependence on respirator [ventilator] status: Secondary | ICD-10-CM

## 2015-06-11 DIAGNOSIS — I5032 Chronic diastolic (congestive) heart failure: Secondary | ICD-10-CM

## 2015-06-11 DIAGNOSIS — J9601 Acute respiratory failure with hypoxia: Secondary | ICD-10-CM

## 2015-06-11 NOTE — Patient Instructions (Signed)
1. Please schedule a follow up appointment in 1 month with Dr. Charlynn Grimes to discuss your pain medication and status of your shortness of breath.   2. Please take all medications as previously prescribed with the following changes:  Take your Lasix!!  We will call you regarding your ECHO  3. If you have worsening of your symptoms or new symptoms arise, please call the clinic [phone]), or go to the ER immediately if symptoms are severe.  You have done a great job in taking all your medications. Please continue to do this.
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
## 2015-06-14 NOTE — Assessment & Plan Note (Addendum)
Seems to be improved since his last visit. Do not suspect PE given his presentation, although, given his h/o malignancy this is still a possibility. No tachycardia, SpO2 95% on room air today. No chest pain. Has a dry cough. Given his LE edema, faint rales, and complaints of PND, wonder if some of this is related to CHF, however, his previous ECHO did not suggest this. Do not suspect radiation pneumonitis as he is far outside of the window for this (last XRT in 12/2014).  -Will continue with Lasix 20 mg daily. Reinforced importance of taking this, especially given his clinical exam -ECHO to assess for CHF -Continue to use O2 for SOB, qhs -RTC in 1 month for follow up with Dr. Charlynn Grimes

## 2015-06-14 NOTE — Assessment & Plan Note (Signed)
Order repeat ECHO given subacute SOB.  -Continue Lasix

## 2015-06-14 NOTE — Progress Notes (Signed)
Medicine attending: Medical history, presenting problems, physical findings, and medications, reviewed with resident physician Dr Luanne Bras on the day of the patient visit and I concur with his evaluation and management plan.

## 2015-06-16 ENCOUNTER — Telehealth: Payer: Self-pay | Admitting: Internal Medicine

## 2015-06-16 ENCOUNTER — Telehealth: Payer: Self-pay | Admitting: *Deleted

## 2015-06-16 NOTE — Telephone Encounter (Signed)
He can [place] for symptom management in the next few days.

## 2015-06-16 NOTE — Telephone Encounter (Signed)
Patient left a messgae wanting an appointment with Dr. Julien Nordmann. Called patient back and he was stating to be having shortness of breath. I called the triage nurse to follow up with patient and advise scheduling o what to do next.

## 2015-06-16 NOTE — Telephone Encounter (Signed)
Per scheduler patient left voicemail requesting appointment to see Dr. Julien Nordmann Monday due to shortness of breath.  Called patient for clarification.     "I started having s.o.b. about a week ago when I get up to do things.  I'm blind and have them crank it up to eight or nine.  I wear the oxygen off and on every now and then.  I feel better after I wear the oxygen.  They check my oxygen level at [facility] and it's usually in the nineties.  I use my ole lady's oxygen under the chin and in the nose at times she doesn't need it.  My daughter called for me today.  I'm blind and need to make transportation arrangements to get there is why she said Monday.  I feel better now.  I just need to know why this is happening."  Will notify Dr. Julien Nordmann.  Asked that he dangle to get his bearings before getting up and not to wear oxygen over 4L Return number [phone]. .    Instructed not to leave messages about physical symptoms on a scheduling voicemail.   Appointments for medical symptoms need to be left with a nursel.

## 2015-06-17 ENCOUNTER — Telehealth: Payer: Self-pay | Admitting: Internal Medicine

## 2015-06-17 NOTE — Telephone Encounter (Signed)
Called patient as ordered by Dr. Julien Nordmann to be seen by [facility]  Patient asked to come in Monday at 10:00 am and he will begin to arrange transportation.  P.O.F. Generated.

## 2015-06-17 NOTE — Telephone Encounter (Signed)
Added [facility] appointment for 2/13 per 2/9 pof. Date/time/CB/pt aware per 2/9 pof.

## 2015-06-17 NOTE — Telephone Encounter (Signed)
LM for rtn call on patient home and cell number.

## 2015-06-17 NOTE — Telephone Encounter (Signed)
LM for Nicholas [name] per scheduling notes- to call for scheduling appointments. Pt needs to be seen sooner than Monday for SOB. Pt had an appt with Internist on 2/3 with same complaints.   Pt has Echo ordered but has not been scheduled yet. Please give central scheduling number to patient to get this ordered.  Pt has prescription for Lasix. Pt needs to begin taking as prescribed despite increased urination. Pt reported he is not taking medication "much". Per Dr. Ronnald Ramp pt SOB could possibly be managed with Lasix, however a CTA to confirm no PE is highly recommended.  [facility] can order CTA to be completed as early as today. Will attempt to contact pt to try to stress the urgency to get the CTA done given his history of PE.   Dr. Ronnald Ramp and his office notified of pt continued complaints. Ordered Echo with expected date of 2/10 has not been scheduled.

## 2015-06-18 ENCOUNTER — Telehealth: Payer: Self-pay

## 2015-06-18 NOTE — Telephone Encounter (Signed)
Per Triage RN message, called pt to check on his shortness of breath and to verify he has transportation for Monday appt.  Pt reports he is doing well and his daughter will bring him to clinic Mon am.  Writer advised pt to go to ED if he has any worsening symptoms over the weekend.  Pt voiced understanding.

## 2015-06-21 ENCOUNTER — Ambulatory Visit (HOSPITAL_BASED_OUTPATIENT_CLINIC_OR_DEPARTMENT_OTHER): Payer: Medicare Other | Admitting: Nurse Practitioner

## 2015-06-21 ENCOUNTER — Ambulatory Visit (HOSPITAL_COMMUNITY)
Admission: RE | Admit: 2015-06-21 | Discharge: 2015-06-21 | Disposition: A | Discharge status: Home / Self Care | Payer: Medicare Other | Source: Ambulatory Visit | Attending: Nurse Practitioner | Admitting: Nurse Practitioner

## 2015-06-21 ENCOUNTER — Ambulatory Visit (HOSPITAL_BASED_OUTPATIENT_CLINIC_OR_DEPARTMENT_OTHER): Payer: Medicare Other

## 2015-06-21 ENCOUNTER — Other Ambulatory Visit (HOSPITAL_COMMUNITY)
Admission: RE | Admit: 2015-06-21 | Discharge: 2015-06-21 | Disposition: A | Discharge status: Home / Self Care | Payer: Medicare Other | Source: Ambulatory Visit | Attending: Nurse Practitioner | Admitting: Nurse Practitioner

## 2015-06-21 ENCOUNTER — Encounter (HOSPITAL_COMMUNITY): Payer: Self-pay

## 2015-06-21 ENCOUNTER — Encounter: Payer: Self-pay | Admitting: Nurse Practitioner

## 2015-06-21 VITALS — BP 134/48 | HR 64 | Temp 98.6°F | Resp 18 | Ht 71.0 in | Wt 237.3 lb

## 2015-06-21 DIAGNOSIS — C3411 Malignant neoplasm of upper lobe, right bronchus or lung: Secondary | ICD-10-CM

## 2015-06-21 DIAGNOSIS — R0600 Dyspnea, unspecified: Secondary | ICD-10-CM

## 2015-06-21 DIAGNOSIS — R918 Other nonspecific abnormal finding of lung field: Secondary | ICD-10-CM | POA: Insufficient documentation

## 2015-06-21 DIAGNOSIS — M7989 Other specified soft tissue disorders: Secondary | ICD-10-CM | POA: Diagnosis not present

## 2015-06-21 DIAGNOSIS — R609 Edema, unspecified: Secondary | ICD-10-CM | POA: Diagnosis not present

## 2015-06-21 DIAGNOSIS — J449 Chronic obstructive pulmonary disease, unspecified: Secondary | ICD-10-CM | POA: Diagnosis not present

## 2015-06-21 DIAGNOSIS — R0602 Shortness of breath: Secondary | ICD-10-CM | POA: Diagnosis not present

## 2015-06-21 DIAGNOSIS — R0902 Hypoxemia: Secondary | ICD-10-CM | POA: Diagnosis not present

## 2015-06-21 LAB — COMPREHENSIVE METABOLIC PANEL
ALT: 12 U/L (ref 0–55)
ANION GAP: 8 meq/L (ref 3–11)
AST: 12 U/L (ref 5–34)
Albumin: 2.7 g/dL — ABNORMAL LOW (ref 3.5–5.0)
Alkaline Phosphatase: 81 U/L (ref 40–150)
BUN: 12.8 mg/dL (ref 7.0–26.0)
CHLORIDE: 106 meq/L (ref 98–109)
CO2: 30 meq/L — AB (ref 22–29)
Calcium: 8.4 mg/dL (ref 8.4–10.4)
Creatinine: 1.4 mg/dL — ABNORMAL HIGH (ref 0.7–1.3)
EGFR: 56 mL/min/{1.73_m2} — AB (ref >90)
GLUCOSE: 102 mg/dL (ref 70–140)
Potassium: 3.8 mEq/L (ref 3.5–5.1)
SODIUM: 144 meq/L (ref 136–145)
TOTAL PROTEIN: 7.2 g/dL (ref 6.4–8.3)

## 2015-06-21 LAB — CBC WITH DIFFERENTIAL/PLATELET
BASO%: 0.7 % (ref 0.0–2.0)
Basophils Absolute: 0 10*3/uL (ref 0.0–0.1)
EOS ABS: 0.3 10*3/uL (ref 0.0–0.5)
EOS%: 6.9 % (ref 0.0–7.0)
HCT: 29.5 % — ABNORMAL LOW (ref 38.4–49.9)
HGB: 9.1 g/dL — ABNORMAL LOW (ref 13.0–17.1)
LYMPH%: 20.4 % (ref 14.0–49.0)
MCH: 25.8 pg — ABNORMAL LOW (ref 27.2–33.4)
MCHC: 31 g/dL — AB (ref 32.0–36.0)
MCV: 83.3 fL (ref 79.3–98.0)
MONO#: 0.5 10*3/uL (ref 0.1–0.9)
MONO%: 11.6 % (ref 0.0–14.0)
NEUT%: 60.4 % (ref 39.0–75.0)
NEUTROS ABS: 2.7 10*3/uL (ref 1.5–6.5)
Platelets: 320 10*3/uL (ref 140–400)
RBC: 3.54 10*6/uL — AB (ref 4.20–5.82)
RDW: 18.1 % — ABNORMAL HIGH (ref 11.0–14.6)
WBC: 4.4 10*3/uL (ref 4.0–10.3)
lymph#: 0.9 10*3/uL (ref 0.9–3.3)

## 2015-06-21 LAB — BRAIN NATRIURETIC PEPTIDE: B Natriuretic Peptide: 313.4 pg/mL — ABNORMAL HIGH (ref 0.0–100.0)

## 2015-06-21 MED ORDER — DOXYCYCLINE HYCLATE 100 MG PO TABS: 100.0000 mg | ORAL_TABLET | Freq: Two times a day (BID) | ORAL | Status: DC

## 2015-06-21 MED ORDER — IOHEXOL 350 MG/ML SOLN
100.0000 mL | Freq: Once | INTRAVENOUS | Status: AC | PRN
  Administered 2015-06-21: 100 mL via INTRAVENOUS

## 2015-06-21 NOTE — Progress Notes (Signed)
Patient Oxygen saturation sitting in a wheelchair is at a constant 87%

## 2015-06-21 NOTE — Progress Notes (Signed)
*  Preliminary Results* Right lower extremity venous duplex completed. Right lower extremity is negative for deep vein thrombosis. There is no evidence of right Baker's cyst.  Incidental finding: There is evidence of multiple heterogenous areas in bilateral groin areas, suggestive of possible enlarged inguinal lymph nodes.  Preliminary results discussed with Retta Mac, NP.  06/21/2015 12:00 PM  Maudry Mayhew, RVT, RDCS, RDMS

## 2015-06-21 NOTE — Assessment & Plan Note (Addendum)
Patient has a history of chronic bilateral lower extremity peripheral edema. He has been prescribed Lasix 20 mg to take on a daily basis per his primary care provider, Dr. Luanne Bras.  However, patient states that he only takes the Lasix on intermittent basis; because it makes him have to stop and use the bathroom too often.    Patient also states that his right lower extremity is always larger than the left lower extremity; secondary to a previous ankle injury.    However, patient reports increased shortness of breath within the past several weeks.  He states that he has been using his girlfriend's oxygen at home as needed.  See further notes for details.  Patient does have some trace edema to the left lower extremity; but increased +3 edema to the right lower extremity with pitting.  In regards topatient's bilateral lower extremity edema-have ordered basic labs and a BNP.patient will also undergo a Doppler ultrasound of the right lower extremity to rule out DVT. __________________________________________________________________  Doppler ultrasound of the right lower extremity was negative for DVT.  BNP was 313.  Patient was advised to continue elevating the stomach.  The level of his heart whenever possible.  He should also take his Lasix as directed per his primary care physician.  He may also want to consider compression stockings as well.

## 2015-06-21 NOTE — Assessment & Plan Note (Addendum)
Patient has previously been diagnosed with lung cancer; and last received chemotherapy in November 2016.  Currently undergoing observation only.  Patient's last restaging ct of the chest was obtained on 04/09/2015; and revealed stable/improved disease.  However, patient is complaining of increased/progressive shortness of breath within the last few weeks.  He states that he sometimes becomes so winded that he uses his girlfriend's oxygen.  He states that he cranks up the oxygen to either 8-9 l and it helps him to feel better.  He complains of significant shortness of breath with any exertion whatsoever.  He denies any chest pain, chest pressure, or pain with inspiration.  He does complain of some trace left upper quadrant discomfort intermittently as well.  He states that he takes vicodin for the pain; but only sometimes helps.  He states that his spouse of the working regularly.  Patient has been treated for CHFper his primary care provider; but states that he only intermittently takes his lasix 20 mg as directed.  He states that it is inconvenient for him to have to always be running to the bathroom.  Exam today reveals breath sounds clear bilaterally; with no wheezing.  He has no cough.Patient appears in no acute respiratory distress.    However, O2 sat when in a wheelchair rolled to the exam room was 86% on room air.  Patient was placed on 2 L nasal cannula with O2 sat improved to 92%.  The plan is for the patient to undergo a Doppler ultrasound to rule out DVT to the right lower extremity.  He will then obtain labs which will include a BNP.  He will then undergo a CT angiogram of the chest to rule out PE.  Her progression of disease.  Also, have ordered.  Patient home oxygen to be used at 2 L nasal cannula.  Have also requested that patient have  Portable shoulder bag oxygen as well.  Awaiting  Portable O2 tank while at the cancer  center. __________________________________________________________________  Doppler ultrasound was negative for DVT.  BNP was slightly elevated 313.  CT angiogram of the chest revealed: IMPRESSION: Known right upper lobe mass measures slightly smaller than the prior study. Measurements as above. Surrounding right upper lobe radiation change suspected as well.  New diffuse right lung and left upper lobe bronchovascular nodular and tree-in-bud scattered opacities, appearance suggests an infectious/inflammatory process such as a bronchopneumonia or atypical infection. Tumor progression throughout both lungs felt to be less likely.  Stable mediastinal and hilar lymph nodes.  Negative for significant acute pulmonary embolus  These results were called by telephone at the time of interpretation on 06/21/2015 at 3:00pm to [facility] , who verbally acknowledged these results.  Reviewed all of these findings with Dr. Julien Nordmann in detail.  He advised for patient to try doxycycline antibiotics.  Prescribe doxycycline to take twice daily until completed.  Patient was also advised to call/return directly to the emergency department for any worsening symptoms whatsoever.  Also, patient received his portable O2 take while still in the cancer center prior to leaving to go home.  Confirmed that the oxygen supply company will be meeting the patient to home to continue the O2 set up.

## 2015-06-21 NOTE — Assessment & Plan Note (Signed)
Patient is status post concurrent chemotherapy and radiation treatments in the past.  He completed 3 cycles of carboplatin/Taxol chemotherapy in November 2016.  He is currently undergoing observation only.  Patient presented to the [facility] today with complaint of increasing shortness of breath, decreased O2 sat, chronic right lower extremity edema.  He is currently undergoing a Doppler ultrasound to rule out DVT.  We'll then obtain labs which will include a BNP; prior to patient obtaining a CT angiogram of the chest.  Patient is scheduled to return for labs only on 07/12/2015.  Patient is scheduled to return for follow-up visit on 07/15/2015.
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
## 2015-07-12 ENCOUNTER — Other Ambulatory Visit (HOSPITAL_BASED_OUTPATIENT_CLINIC_OR_DEPARTMENT_OTHER): Payer: Medicare Other

## 2015-07-12 DIAGNOSIS — C3411 Malignant neoplasm of upper lobe, right bronchus or lung: Secondary | ICD-10-CM

## 2015-07-12 LAB — CBC WITH DIFFERENTIAL/PLATELET
BASO%: 1 % (ref 0.0–2.0)
Basophils Absolute: 0 10*3/uL (ref 0.0–0.1)
EOS ABS: 0.2 10*3/uL (ref 0.0–0.5)
EOS%: 5.5 % (ref 0.0–7.0)
HCT: 32.9 % — ABNORMAL LOW (ref 38.4–49.9)
HEMOGLOBIN: 10.3 g/dL — AB (ref 13.0–17.1)
LYMPH#: 0.7 10*3/uL — AB (ref 0.9–3.3)
LYMPH%: 16.8 % (ref 14.0–49.0)
MCH: 25.7 pg — ABNORMAL LOW (ref 27.2–33.4)
MCHC: 31.3 g/dL — ABNORMAL LOW (ref 32.0–36.0)
MCV: 82.1 fL (ref 79.3–98.0)
MONO#: 0.5 10*3/uL (ref 0.1–0.9)
MONO%: 13 % (ref 0.0–14.0)
NEUT%: 63.7 % (ref 39.0–75.0)
NEUTROS ABS: 2.6 10*3/uL (ref 1.5–6.5)
Platelets: 258 10*3/uL (ref 140–400)
RBC: 4.01 10*6/uL — ABNORMAL LOW (ref 4.20–5.82)
RDW: 18 % — AB (ref 11.0–14.6)
WBC: 4.1 10*3/uL (ref 4.0–10.3)

## 2015-07-12 LAB — COMPREHENSIVE METABOLIC PANEL
ALBUMIN: 3.1 g/dL — AB (ref 3.5–5.0)
ALK PHOS: 78 U/L (ref 40–150)
ALT: 12 U/L (ref 0–55)
ANION GAP: 9 meq/L (ref 3–11)
AST: 13 U/L (ref 5–34)
BUN: 15.6 mg/dL (ref 7.0–26.0)
CALCIUM: 8.5 mg/dL (ref 8.4–10.4)
CHLORIDE: 104 meq/L (ref 98–109)
CO2: 28 mEq/L (ref 22–29)
Creatinine: 1.1 mg/dL (ref 0.7–1.3)
EGFR: 71 mL/min/{1.73_m2} — AB (ref >90)
Glucose: 160 mg/dl — ABNORMAL HIGH (ref 70–140)
POTASSIUM: 3.5 meq/L (ref 3.5–5.1)
Sodium: 140 mEq/L (ref 136–145)
Total Bilirubin: 0.33 mg/dL (ref 0.20–1.20)
Total Protein: 7.3 g/dL (ref 6.4–8.3)

## 2015-07-15 ENCOUNTER — Telehealth: Payer: Self-pay | Admitting: Internal Medicine

## 2015-07-15 ENCOUNTER — Ambulatory Visit (HOSPITAL_BASED_OUTPATIENT_CLINIC_OR_DEPARTMENT_OTHER): Payer: Medicare Other | Admitting: Internal Medicine

## 2015-07-15 ENCOUNTER — Encounter: Payer: Self-pay | Admitting: Internal Medicine

## 2015-07-15 VITALS — BP 125/66 | HR 64 | Temp 98.4°F | Resp 18 | Ht 71.0 in | Wt 234.1 lb

## 2015-07-15 DIAGNOSIS — C3490 Malignant neoplasm of unspecified part of unspecified bronchus or lung: Secondary | ICD-10-CM | POA: Diagnosis not present

## 2015-07-15 DIAGNOSIS — C3411 Malignant neoplasm of upper lobe, right bronchus or lung: Secondary | ICD-10-CM

## 2015-07-15 NOTE — Telephone Encounter (Signed)
per pof to sch pt appt-gave pt copy of avs °
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
## 2015-07-19 ENCOUNTER — Other Ambulatory Visit: Payer: Self-pay

## 2015-07-19 DIAGNOSIS — J449 Chronic obstructive pulmonary disease, unspecified: Secondary | ICD-10-CM | POA: Diagnosis not present

## 2015-07-19 MED ORDER — FUROSEMIDE 20 MG PO TABS: 20.0000 mg | ORAL_TABLET | Freq: Every day | ORAL | Status: DC

## 2015-08-03 ENCOUNTER — Ambulatory Visit (INDEPENDENT_AMBULATORY_CARE_PROVIDER_SITE_OTHER): Payer: Medicare Other | Admitting: Internal Medicine

## 2015-08-03 ENCOUNTER — Ambulatory Visit (HOSPITAL_COMMUNITY)
Admission: RE | Admit: 2015-08-03 | Discharge: 2015-08-03 | Disposition: A | Discharge status: Home / Self Care | Payer: Medicare Other | Source: Ambulatory Visit | Attending: Internal Medicine | Admitting: Internal Medicine

## 2015-08-03 ENCOUNTER — Encounter: Payer: Self-pay | Admitting: Internal Medicine

## 2015-08-03 VITALS — BP 117/54 | HR 76 | Temp 98.1°F | Ht 71.0 in | Wt 227.0 lb

## 2015-08-03 DIAGNOSIS — M150 Primary generalized (osteo)arthritis: Secondary | ICD-10-CM | POA: Diagnosis not present

## 2015-08-03 DIAGNOSIS — R9431 Abnormal electrocardiogram [ECG] [EKG]: Secondary | ICD-10-CM | POA: Diagnosis not present

## 2015-08-03 DIAGNOSIS — I498 Other specified cardiac arrhythmias: Secondary | ICD-10-CM | POA: Insufficient documentation

## 2015-08-03 DIAGNOSIS — I5032 Chronic diastolic (congestive) heart failure: Secondary | ICD-10-CM

## 2015-08-03 DIAGNOSIS — M549 Dorsalgia, unspecified: Secondary | ICD-10-CM

## 2015-08-03 DIAGNOSIS — Z79891 Long term (current) use of opiate analgesic: Secondary | ICD-10-CM | POA: Diagnosis not present

## 2015-08-03 DIAGNOSIS — G8929 Other chronic pain: Secondary | ICD-10-CM | POA: Diagnosis not present

## 2015-08-03 DIAGNOSIS — M159 Polyosteoarthritis, unspecified: Secondary | ICD-10-CM

## 2015-08-03 MED ORDER — HYDROCODONE-ACETAMINOPHEN 10-325 MG PO TABS: 1.0000 | ORAL_TABLET | Freq: Four times a day (QID) | ORAL | Status: DC | PRN

## 2015-08-03 NOTE — Assessment & Plan Note (Signed)
Patient asymptomatic.  However, pulse appears to come in 3 beat triplets.  He has chronic intermittent SOB relieved with home oxygen, but otherwise denies chest pain, palpitations, or dizziness.  ECG shows sinus arrhythmia, possibly MAT.   A/P: Sinus arrhythmia, possibly MAT.  No need for further work up at this time.   - CTM

## 2015-08-03 NOTE — Patient Instructions (Addendum)
1. Take your pain medications as prescribed, which is one pill every 6 hours. 2. Follow up with Dr. Charlynn Grimes, your PCP, when appointment becomes available.  Acetaminophen; Hydrocodone tablets or capsules What is this medicine? ACETAMINOPHEN; HYDROCODONE (a set a MEE noe fen; hye droe KOE done) is a pain reliever. It is used to treat moderate to severe pain. This medicine may be used for other purposes; ask your health care provider or pharmacist if you have questions. What should I tell my health care provider before I take this medicine? They need to know if you have any of these conditions: -brain tumor -Crohn's disease, inflammatory bowel disease, or ulcerative colitis -drug abuse or addiction -head injury -heart or circulation problems -if you often drink alcohol -kidney disease or problems going to the bathroom -liver disease -lung disease, asthma, or breathing problems -an unusual or allergic reaction to acetaminophen, hydrocodone, other opioid analgesics, other medicines, foods, dyes, or preservatives -pregnant or trying to get pregnant -breast-feeding How should I use this medicine? Take this medicine by mouth. Swallow it with a full glass of water. Follow the directions on the prescription label. If the medicine upsets your stomach, take the medicine with food or milk. Do not take more than you are told to take. Talk to your pediatrician regarding the use of this medicine in children. This medicine is not approved for use in children. Patients over 65 years may have a stronger reaction and need a smaller dose. Overdosage: If you think you have taken too much of this medicine contact a poison control center or emergency room at once. NOTE: This medicine is only for you. Do not share this medicine with others. What if I miss a dose? If you miss a dose, take it as soon as you can. If it is almost time for your next dose, take only that dose. Do not take double or extra doses. What  may interact with this medicine? -alcohol -antihistamines -isoniazid -medicines for depression, anxiety, or psychotic disturbances -medicines for sleep -muscle relaxants -naltrexone -narcotic medicines (opiates) for pain -phenobarbital -ritonavir -tramadol This list may not describe all possible interactions. Give your health care provider a list of all the medicines, herbs, non-prescription drugs, or dietary supplements you use. Also tell them if you smoke, drink alcohol, or use illegal drugs. Some items may interact with your medicine. What should I watch for while using this medicine? Tell your doctor or health care professional if your pain does not go away, if it gets worse, or if you have new or a different type of pain. You may develop tolerance to the medicine. Tolerance means that you will need a higher dose of the medicine for pain relief. Tolerance is normal and is expected if you take the medicine for a long time. Do not suddenly stop taking your medicine because you may develop a severe reaction. Your body becomes used to the medicine. This does NOT mean you are addicted. Addiction is a behavior related to getting and using a drug for a non-medical reason. If you have pain, you have a medical reason to take pain medicine. Your doctor will tell you how much medicine to take. If your doctor wants you to stop the medicine, the dose will be slowly lowered over time to avoid any side effects. You may get drowsy or dizzy when you first start taking the medicine or change doses. Do not drive, use machinery, or do anything that may be dangerous until you know how the  medicine affects you. Stand or sit up slowly. There are different types of narcotic medicines (opiates) for pain. If you take more than one type at the same time, you may have more side effects. Give your health care provider a list of all medicines you use. Your doctor will tell you how much medicine to take. Do not take more  medicine than directed. Call emergency for help if you have problems breathing. The medicine will cause constipation. Try to have a bowel movement at least every 2 to 3 days. If you do not have a bowel movement for 3 days, call your doctor or health care professional. Too much acetaminophen can be very dangerous. Do not take Tylenol (acetaminophen) or medicines that contain acetaminophen with this medicine. Many non-prescription medicines contain acetaminophen. Always read the labels carefully. What side effects may I notice from receiving this medicine? Side effects that you should report to your doctor or health care professional as soon as possible: -allergic reactions like skin rash, itching or hives, swelling of the face, lips, or tongue -breathing problems -confusion -feeling faint or lightheaded, falls -stomach pain -yellowing of the eyes or skin Side effects that usually do not require medical attention (report to your doctor or health care professional if they continue or are bothersome): -nausea, vomiting -stomach upset This list may not describe all possible side effects. Call your doctor for medical advice about side effects. You may report side effects to FDA at 1-800-FDA-1088. Where should I keep my medicine? Keep out of the reach of children. This medicine can be abused. Keep your medicine in a safe place to protect it from theft. Do not share this medicine with anyone. Selling or giving away this medicine is dangerous and against the law. This medicine may cause accidental overdose and death if it taken by other adults, children, or pets. Mix any unused medicine with a substance like cat litter or coffee grounds. Then throw the medicine away in a sealed container like a sealed bag or a coffee can with a lid. Do not use the medicine after the expiration date. Store at room temperature between 15 and 30 degrees C (59 and 86 degrees F). NOTE: This sheet is a summary. It may not cover  all possible information. If you have questions about this medicine, talk to your doctor, pharmacist, or health care provider.    2016, Elsevier/Gold Standard. (2014-03-25 15:29:20)
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
## 2015-08-03 NOTE — Assessment & Plan Note (Signed)
Patient continues to complain of his back/joint pain.  He is using his Norco 10 mg 2 tabs twice daily, with questionable relief.  He last used his pills this morning.  His quality or quantity of pain has not changed.  He is only here to ask for pain medications refills.   A/P: OA and chronic pain.  Will refill pain medications and check UDS, though he needs to have appointment with PCP for further discussion of prescription adherence.  Due to no PCP appointments in the near future, will refill for 3 month supply. - Norco 10-325 mg #120 - UDS

## 2015-08-04 NOTE — Progress Notes (Signed)
Internal Medicine Clinic Attending  Case discussed with Dr. Taylor at the time of the visit.  We reviewed the resident's history and exam and pertinent patient test results.  I agree with the assessment, diagnosis, and plan of care documented in the resident's note. 

## 2015-08-10 LAB — TOXASSURE SELECT,+ANTIDEPR,UR: PDF: 0

## 2015-08-19 DIAGNOSIS — J449 Chronic obstructive pulmonary disease, unspecified: Secondary | ICD-10-CM | POA: Diagnosis not present

## 2015-08-27 ENCOUNTER — Ambulatory Visit (INDEPENDENT_AMBULATORY_CARE_PROVIDER_SITE_OTHER): Payer: Medicare Other | Admitting: Internal Medicine

## 2015-08-27 ENCOUNTER — Encounter: Payer: Self-pay | Admitting: Internal Medicine

## 2015-08-27 VITALS — BP 146/77 | HR 62 | Temp 98.2°F | Ht 71.0 in | Wt 234.7 lb

## 2015-08-27 DIAGNOSIS — I10 Essential (primary) hypertension: Secondary | ICD-10-CM | POA: Diagnosis not present

## 2015-08-27 DIAGNOSIS — M150 Primary generalized (osteo)arthritis: Secondary | ICD-10-CM | POA: Diagnosis not present

## 2015-08-27 DIAGNOSIS — R609 Edema, unspecified: Secondary | ICD-10-CM | POA: Diagnosis not present

## 2015-08-27 DIAGNOSIS — M159 Polyosteoarthritis, unspecified: Secondary | ICD-10-CM

## 2015-08-27 MED ORDER — LUMBAR BACK BRACE/SUPPORT PAD MISC: 1.0000 | Status: AC | PRN
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
## 2015-08-27 NOTE — Assessment & Plan Note (Signed)
Pt has chronic pedal edema with rt leg worse than left. LE [place] negative for DVT 2 months ago and BNP mildly elevated at 313. He has lasix that he uses as needed for swelling. He has a future order for a repeat ECHO, ECHO in 2015 was nl. He denies orthopnea and difficulty breathing. He wears compression stockings but does not have them on today.   - follow ECHO results - advised to continue leg elevation and compression stockings.

## 2015-08-27 NOTE — Progress Notes (Signed)
Medicine attending: Medical history, presenting problems, physical findings, and medications, reviewed with resident physician Dr Julious Oka on the day of the patient visit and I concur with her evaluation and management plan.

## 2015-08-27 NOTE — Assessment & Plan Note (Signed)
BP Readings from Last 3 Encounters:  08/27/15 146/77  08/03/15 117/54  07/15/15 125/66    Lab Results  Component Value Date   NA 140 07/12/2015   K 3.5 07/12/2015   CREATININE 1.1 07/12/2015    Assessment: Blood pressure control:  controlled Progress toward BP goal:   near goal Comments: Pt brought all his meds in, they were reviewed and updated in chart. His HCTZ was discarded today as he is not suppose to be this.   Plan: Medications:  continue current medications Educational resources provided:   Self management tools provided:   Other plans: f/u in 3-6 months.

## 2015-08-27 NOTE — Patient Instructions (Addendum)
Continue using norco for your chronic back pain. You can also use Voltaren gel to help control pain as well that you have at home.   Continue elevating your legs to help with leg swelling.

## 2015-08-27 NOTE — Assessment & Plan Note (Signed)
Pt's chronic back pain symptoms are stable and controlled with norco. He is requesting a back brace today which I think is a great idea to help w/ back pain. He also has Voltaren gel at home that he has not been using. Advised him to use this as needed for back pain as well.   - rx sent in for back brace - f/u prn if sx worsen

## 2015-09-18 DIAGNOSIS — J449 Chronic obstructive pulmonary disease, unspecified: Secondary | ICD-10-CM | POA: Diagnosis not present

## 2015-10-05 ENCOUNTER — Telehealth: Payer: Self-pay

## 2015-10-05 ENCOUNTER — Other Ambulatory Visit: Payer: Self-pay

## 2015-10-05 NOTE — Telephone Encounter (Signed)
Pt daughter requesting hydrocodone to be filled.

## 2015-10-05 NOTE — Telephone Encounter (Signed)
Pt picked up 120 this am at 1115 from [facility] [address], has an appt upcoming w/ pcp, ask pt's daughter to call 6/26, 27 for next refill

## 2015-10-13 ENCOUNTER — Ambulatory Visit (HOSPITAL_COMMUNITY)
Admission: RE | Admit: 2015-10-13 | Discharge: 2015-10-13 | Disposition: A | Discharge status: Home / Self Care | Payer: Medicare Other | Source: Ambulatory Visit | Attending: Internal Medicine | Admitting: Internal Medicine

## 2015-10-13 ENCOUNTER — Other Ambulatory Visit (HOSPITAL_BASED_OUTPATIENT_CLINIC_OR_DEPARTMENT_OTHER): Payer: Medicare Other

## 2015-10-13 ENCOUNTER — Encounter (HOSPITAL_COMMUNITY): Payer: Self-pay

## 2015-10-13 DIAGNOSIS — R590 Localized enlarged lymph nodes: Secondary | ICD-10-CM | POA: Diagnosis not present

## 2015-10-13 DIAGNOSIS — C3411 Malignant neoplasm of upper lobe, right bronchus or lung: Secondary | ICD-10-CM | POA: Diagnosis not present

## 2015-10-13 DIAGNOSIS — R918 Other nonspecific abnormal finding of lung field: Secondary | ICD-10-CM | POA: Diagnosis not present

## 2015-10-13 LAB — COMPREHENSIVE METABOLIC PANEL
ALBUMIN: 3.2 g/dL — AB (ref 3.5–5.0)
ALK PHOS: 65 U/L (ref 40–150)
ALT: 11 U/L (ref 0–55)
ANION GAP: 5 meq/L (ref 3–11)
AST: 15 U/L (ref 5–34)
BILIRUBIN TOTAL: 0.33 mg/dL (ref 0.20–1.20)
BUN: 17.6 mg/dL (ref 7.0–26.0)
CALCIUM: 8.7 mg/dL (ref 8.4–10.4)
CO2: 28 mEq/L (ref 22–29)
CREATININE: 1 mg/dL (ref 0.7–1.3)
Chloride: 105 mEq/L (ref 98–109)
EGFR: 79 mL/min/{1.73_m2} — ABNORMAL LOW (ref >90)
Glucose: 93 mg/dl (ref 70–140)
Potassium: 3.8 mEq/L (ref 3.5–5.1)
Sodium: 138 mEq/L (ref 136–145)
TOTAL PROTEIN: 6.9 g/dL (ref 6.4–8.3)

## 2015-10-13 LAB — CBC WITH DIFFERENTIAL/PLATELET
BASO%: 1.3 % (ref 0.0–2.0)
Basophils Absolute: 0 10*3/uL (ref 0.0–0.1)
EOS%: 8 % — AB (ref 0.0–7.0)
Eosinophils Absolute: 0.3 10*3/uL (ref 0.0–0.5)
HEMATOCRIT: 33.4 % — AB (ref 38.4–49.9)
HEMOGLOBIN: 10.6 g/dL — AB (ref 13.0–17.1)
LYMPH#: 1.3 10*3/uL (ref 0.9–3.3)
LYMPH%: 38.1 % (ref 14.0–49.0)
MCH: 25.8 pg — ABNORMAL LOW (ref 27.2–33.4)
MCHC: 31.7 g/dL — AB (ref 32.0–36.0)
MCV: 81.3 fL (ref 79.3–98.0)
MONO#: 0.4 10*3/uL (ref 0.1–0.9)
MONO%: 11.6 % (ref 0.0–14.0)
NEUT%: 41 % (ref 39.0–75.0)
NEUTROS ABS: 1.4 10*3/uL — AB (ref 1.5–6.5)
PLATELETS: 208 10*3/uL (ref 140–400)
RBC: 4.11 10*6/uL — ABNORMAL LOW (ref 4.20–5.82)
RDW: 16.8 % — AB (ref 11.0–14.6)
WBC: 3.3 10*3/uL — ABNORMAL LOW (ref 4.0–10.3)

## 2015-10-13 MED ORDER — IOPAMIDOL (ISOVUE-300) INJECTION 61%
75.0000 mL | Freq: Once | INTRAVENOUS | Status: AC | PRN
  Administered 2015-10-13: 75 mL via INTRAVENOUS

## 2015-10-18 ENCOUNTER — Encounter: Payer: Self-pay | Admitting: *Deleted

## 2015-10-18 ENCOUNTER — Telehealth: Payer: Self-pay | Admitting: Internal Medicine

## 2015-10-18 ENCOUNTER — Encounter: Payer: Self-pay | Admitting: Internal Medicine

## 2015-10-18 ENCOUNTER — Ambulatory Visit (HOSPITAL_BASED_OUTPATIENT_CLINIC_OR_DEPARTMENT_OTHER): Payer: Medicare Other | Admitting: Internal Medicine

## 2015-10-18 VITALS — BP 174/64 | HR 92 | Temp 97.5°F | Resp 18 | Ht 71.0 in | Wt 229.4 lb

## 2015-10-18 DIAGNOSIS — C3411 Malignant neoplasm of upper lobe, right bronchus or lung: Secondary | ICD-10-CM | POA: Diagnosis not present
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
## 2015-10-18 NOTE — Progress Notes (Signed)
Oncology Nurse Navigator Documentation  Oncology Nurse Navigator Flowsheets 10/18/2015  Navigator Encounter Type Clinic/MDC  Treatment Phase Other  Barriers/Navigation Needs No barriers at this time  Interventions None required  Acuity Level 1  Acuity Level 1 Minimal follow up required  Time Spent with Patient 64   Spoke with patient and daughter today at [facility].  He is doing well.  He had a CT Chest and will continue observation at this time.  No barriers identified.

## 2015-10-18 NOTE — Telephone Encounter (Signed)
per pof to sch pt appt-gave pt copy of avs-adv pt that [facility] will call to sch trmt

## 2015-10-19 DIAGNOSIS — J449 Chronic obstructive pulmonary disease, unspecified: Secondary | ICD-10-CM | POA: Diagnosis not present

## 2015-11-01 ENCOUNTER — Other Ambulatory Visit: Payer: Self-pay

## 2015-11-01 DIAGNOSIS — M150 Primary generalized (osteo)arthritis: Principal | ICD-10-CM

## 2015-11-01 DIAGNOSIS — M159 Polyosteoarthritis, unspecified: Secondary | ICD-10-CM

## 2015-11-01 NOTE — Telephone Encounter (Signed)
Pt requesting hydrocodone to be filled.  °

## 2015-11-02 NOTE — Telephone Encounter (Signed)
According to [facility], the 3 script from 07/2015 was filled 10/05/2015, he will need a new script by 6/29 due to 31 days in may, he states he will keep appt in July Last uds 3/28

## 2015-11-03 MED ORDER — HYDROCODONE-ACETAMINOPHEN 10-325 MG PO TABS: 1.0000 | ORAL_TABLET | Freq: Four times a day (QID) | ORAL | Status: DC | PRN

## 2015-11-03 NOTE — Telephone Encounter (Signed)
Confirmed that last Rx was filled 10/05/2015. Will refill for 1 month and follow up appointment in July. UDS 3/28 was appropriate.

## 2015-11-03 NOTE — Telephone Encounter (Signed)
Pt informed

## 2015-11-17 ENCOUNTER — Ambulatory Visit (INDEPENDENT_AMBULATORY_CARE_PROVIDER_SITE_OTHER): Payer: Medicare Other | Admitting: Internal Medicine

## 2015-11-17 VITALS — BP 148/61 | HR 64 | Temp 98.1°F | Ht 71.0 in | Wt 238.3 lb

## 2015-11-17 DIAGNOSIS — Z9221 Personal history of antineoplastic chemotherapy: Secondary | ICD-10-CM

## 2015-11-17 DIAGNOSIS — C3411 Malignant neoplasm of upper lobe, right bronchus or lung: Secondary | ICD-10-CM

## 2015-11-17 DIAGNOSIS — N182 Chronic kidney disease, stage 2 (mild): Secondary | ICD-10-CM | POA: Diagnosis not present

## 2015-11-17 DIAGNOSIS — I130 Hypertensive heart and chronic kidney disease with heart failure and stage 1 through stage 4 chronic kidney disease, or unspecified chronic kidney disease: Secondary | ICD-10-CM | POA: Diagnosis not present

## 2015-11-17 DIAGNOSIS — I5032 Chronic diastolic (congestive) heart failure: Secondary | ICD-10-CM

## 2015-11-17 DIAGNOSIS — M150 Primary generalized (osteo)arthritis: Secondary | ICD-10-CM | POA: Diagnosis not present

## 2015-11-17 DIAGNOSIS — I10 Essential (primary) hypertension: Secondary | ICD-10-CM

## 2015-11-17 DIAGNOSIS — Z923 Personal history of irradiation: Secondary | ICD-10-CM

## 2015-11-17 DIAGNOSIS — M159 Polyosteoarthritis, unspecified: Secondary | ICD-10-CM

## 2015-11-17 MED ORDER — CARVEDILOL 25 MG PO TABS: 25.0000 mg | ORAL_TABLET | Freq: Two times a day (BID) | ORAL | Status: DC

## 2015-11-17 MED ORDER — HYDROCODONE-ACETAMINOPHEN 10-325 MG PO TABS: 1.0000 | ORAL_TABLET | Freq: Four times a day (QID) | ORAL | Status: DC | PRN

## 2015-11-17 MED ORDER — PANTOPRAZOLE SODIUM 20 MG PO TBEC: DELAYED_RELEASE_TABLET | ORAL | Status: DC

## 2015-11-17 MED ORDER — ENALAPRIL MALEATE 20 MG PO TABS: ORAL_TABLET | ORAL | Status: DC

## 2015-11-17 MED ORDER — FUROSEMIDE 20 MG PO TABS: 20.0000 mg | ORAL_TABLET | Freq: Every day | ORAL | Status: DC

## 2015-11-17 NOTE — Assessment & Plan Note (Signed)
Patient is status post concurrent chemotherapy and radiation treatments in the past. He completed 3 cycles of carboplatin/Taxol chemotherapy in November 2016. He is currently undergoing observation only.  Due for repeat CT scan October 2017.

## 2015-11-17 NOTE — Patient Instructions (Signed)
Thank you for coming in today.  You can take your pain medication 3-4 times a day as needed. If your pain gets worse please give the clinic a call. If you bring your paperwork for your back brace in we can get you set up to have that done.   I would like to see you back in 3 months for follow up.
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
## 2015-11-17 NOTE — Assessment & Plan Note (Addendum)
BP Readings from Last 3 Encounters:  10/18/15 174/64  08/27/15 146/77  08/03/15 117/54    Lab Results  Component Value Date   NA 138 10/13/2015   K 3.8 10/13/2015   CREATININE 1.0 10/13/2015    Assessment: Pt brought all his meds in, they were reviewed and updated in chart. His HCTZ was stopped at last visit. Patient currently on Coreg 25 mg bid and enalapril 20 mg bid as well as Lasix 20 mg daily.  BP today is 148/61.   Plan: Continue current medications.

## 2015-11-17 NOTE — Assessment & Plan Note (Signed)
Currently asymptomatic. Denies any shortness of breath, orthopnea or PND. Weight has been up and down, up today. Does no weigh himself at home. Complains of LE edema today but improved with knee-high compression stockings. 2+ pitting bilaterally today.  Plan Schedule repeat ECHO Continue Lasix 20 mg daily

## 2015-11-17 NOTE — Assessment & Plan Note (Addendum)
Patient continues to complain of his back/joint pain. He reports is using his Norco 10 mg three times daily, with some relief. This in increased from bid before. He last used his pills yesterday. His quality of pain has not changed but does report increased amount of pain requiring more frequent dosing.Last UDS done 07/2015 with appropriate results. Review of the [place] [employer] database shows no aberations with appropriate refills.   Average pain in the last week: 7.5/10 Average of pain affecting enjoyment in last week: 5/10 Average of pain affecting activity in last week: 5/10  Discussed PT, muscle relaxant and injections today. Reports having all done in the past with no improvement. No interest in trying any of these again today.   Uses Voltaren gel with minimal relief. Has not gotten the back brace paper work filled out and did not bring it with him today.  A/P: OA and chronic pain. Will refill pain medications for 3 months supply. - Norco 10-325 mg #120 - UDS at next visit

## 2015-11-17 NOTE — Assessment & Plan Note (Signed)
CMET last checked 10/13/15. Cr stable at 1.0. EGFR stable in 70s. -Continue to monitor 

## 2015-11-18 DIAGNOSIS — J449 Chronic obstructive pulmonary disease, unspecified: Secondary | ICD-10-CM | POA: Diagnosis not present

## 2015-11-19 NOTE — Progress Notes (Signed)
Internal Medicine Clinic Attending  Case discussed with Dr. Boswell at the time of the visit.  We reviewed the resident's history and exam and pertinent patient test results.  I agree with the assessment, diagnosis, and plan of care documented in the resident's note.  

## 2015-11-23 ENCOUNTER — Other Ambulatory Visit: Payer: Self-pay | Admitting: Internal Medicine

## 2015-11-23 NOTE — Addendum Note (Signed)
Addended by: Zortman Lions on: 11/23/2015 01:28 PM   Modules accepted: Orders

## 2015-11-23 NOTE — Telephone Encounter (Signed)
[facility] about [facility], he doesn't know who or why called, if they call back he will ask their name and #

## 2015-11-23 NOTE — Telephone Encounter (Signed)
Pt called with questions about all of his medications

## 2015-12-06 ENCOUNTER — Other Ambulatory Visit: Payer: Self-pay | Admitting: *Deleted

## 2015-12-06 NOTE — Telephone Encounter (Addendum)
Request submitted online via Cover My Meds-request approved.  Pharmacy aware.Despina Hidden Cassady7/31/20173:11 PM     PA Case [id number] is Approved. For further questions, call [phone]

## 2015-12-16 ENCOUNTER — Ambulatory Visit (INDEPENDENT_AMBULATORY_CARE_PROVIDER_SITE_OTHER): Payer: Medicare Other | Admitting: Internal Medicine

## 2015-12-16 VITALS — BP 156/55 | HR 69 | Temp 98.6°F | Ht 71.0 in | Wt 236.9 lb

## 2015-12-16 DIAGNOSIS — B958 Unspecified staphylococcus as the cause of diseases classified elsewhere: Secondary | ICD-10-CM | POA: Insufficient documentation

## 2015-12-16 DIAGNOSIS — M7989 Other specified soft tissue disorders: Secondary | ICD-10-CM

## 2015-12-16 MED ORDER — IBUPROFEN 200 MG PO TABS: 200.0000 mg | ORAL_TABLET | ORAL | 2 refills | Status: AC | PRN

## 2015-12-16 MED ORDER — DOXYCYCLINE HYCLATE 100 MG PO CAPS: 100.0000 mg | ORAL_CAPSULE | Freq: Two times a day (BID) | ORAL | 0 refills | Status: AC
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
## 2015-12-16 NOTE — Assessment & Plan Note (Addendum)
Assessment:  Staph Infection of right arm Patient's right arm is warm, erythematous with a swollen area that is 2in in diameter.  Patient had been scratching at the area.  There is a small opening in the wound that appears to have drained something earlier.  In office there was no drainage and very little clear fluid.  At this point it looks like inflamed tissue and I&D may not be warranted at this time.  Plan - Doxycycline '100mg'$  BID for 7 days - Ibuprofen '200mg'$  Q4 for pain - Told patient to call on Monday if symptoms have gotten worse or not improved

## 2015-12-16 NOTE — Patient Instructions (Addendum)
Nicholas [name]   Please take Doxycycline '100mg'$  2 times a day for 7 days  Please call the clinic by Monday if your symptoms have not improved or have gotten worse

## 2015-12-19 DIAGNOSIS — J449 Chronic obstructive pulmonary disease, unspecified: Secondary | ICD-10-CM | POA: Diagnosis not present

## 2015-12-27 NOTE — Progress Notes (Signed)
Internal Medicine Clinic Attending  I saw and evaluated the patient.  I personally confirmed the key portions of the history and exam documented by Dr. Hoffman and I reviewed pertinent patient test results.  The assessment, diagnosis, and plan were formulated together and I agree with the documentation in the resident's note.      

## 2015-12-31 ENCOUNTER — Other Ambulatory Visit: Payer: Self-pay | Admitting: Internal Medicine

## 2016-01-09 ENCOUNTER — Other Ambulatory Visit: Payer: Self-pay | Admitting: Internal Medicine

## 2016-01-09 DIAGNOSIS — E785 Hyperlipidemia, unspecified: Secondary | ICD-10-CM

## 2016-01-16 ENCOUNTER — Other Ambulatory Visit: Payer: Self-pay | Admitting: Internal Medicine

## 2016-01-19 DIAGNOSIS — J449 Chronic obstructive pulmonary disease, unspecified: Secondary | ICD-10-CM | POA: Diagnosis not present

## 2016-02-03 ENCOUNTER — Ambulatory Visit: Payer: Medicare Other

## 2016-02-03 ENCOUNTER — Encounter (HOSPITAL_COMMUNITY): Payer: Self-pay | Admitting: Emergency Medicine

## 2016-02-03 ENCOUNTER — Encounter: Payer: Self-pay | Admitting: Internal Medicine

## 2016-02-03 ENCOUNTER — Emergency Department (HOSPITAL_COMMUNITY)
Admission: EM | Admit: 2016-02-03 | Discharge: 2016-02-03 | Disposition: A | Discharge status: Home / Self Care | Payer: Medicare Other | Attending: Emergency Medicine | Admitting: Emergency Medicine

## 2016-02-03 DIAGNOSIS — L0291 Cutaneous abscess, unspecified: Secondary | ICD-10-CM

## 2016-02-03 DIAGNOSIS — Z87891 Personal history of nicotine dependence: Secondary | ICD-10-CM | POA: Diagnosis not present

## 2016-02-03 DIAGNOSIS — I130 Hypertensive heart and chronic kidney disease with heart failure and stage 1 through stage 4 chronic kidney disease, or unspecified chronic kidney disease: Secondary | ICD-10-CM | POA: Diagnosis not present

## 2016-02-03 DIAGNOSIS — L03113 Cellulitis of right upper limb: Secondary | ICD-10-CM | POA: Diagnosis not present

## 2016-02-03 DIAGNOSIS — J449 Chronic obstructive pulmonary disease, unspecified: Secondary | ICD-10-CM | POA: Diagnosis not present

## 2016-02-03 DIAGNOSIS — L0390 Cellulitis, unspecified: Secondary | ICD-10-CM

## 2016-02-03 DIAGNOSIS — N182 Chronic kidney disease, stage 2 (mild): Secondary | ICD-10-CM | POA: Diagnosis not present

## 2016-02-03 DIAGNOSIS — Z85118 Personal history of other malignant neoplasm of bronchus and lung: Secondary | ICD-10-CM | POA: Diagnosis not present

## 2016-02-03 DIAGNOSIS — L02413 Cutaneous abscess of right upper limb: Secondary | ICD-10-CM | POA: Diagnosis not present

## 2016-02-03 DIAGNOSIS — I5032 Chronic diastolic (congestive) heart failure: Secondary | ICD-10-CM | POA: Diagnosis not present

## 2016-02-03 MED ORDER — CLINDAMYCIN HCL 150 MG PO CAPS
450.0000 mg | ORAL_CAPSULE | Freq: Once | ORAL | Status: AC
  Administered 2016-02-03: 450 mg via ORAL
  Filled 2016-02-03: qty 3

## 2016-02-03 MED ORDER — LIDOCAINE-EPINEPHRINE (PF) 2 %-1:200000 IJ SOLN
10.0000 mL | Freq: Once | INTRAMUSCULAR | Status: AC
  Administered 2016-02-03: 10 mL via INTRADERMAL
  Filled 2016-02-03: qty 10

## 2016-02-03 MED ORDER — CLINDAMYCIN HCL 150 MG PO CAPS: 450.0000 mg | ORAL_CAPSULE | Freq: Three times a day (TID) | ORAL | 0 refills | Status: DC

## 2016-02-03 NOTE — ED Triage Notes (Signed)
Patient states abscess to R arm, with another "bite" mark coming up.  Patient states he had one last month.   Patient states noone else around him has any bites.   Patient states the area has drained "sometimes when I mash it".   Patient denies any other problems.
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
## 2016-02-03 NOTE — Discharge Instructions (Signed)
If you develop fever, have vomiting or if the swelling and redness starts spreading , return to the emergency room immediately for a recheck.  Please follow with your primary care doctor in the next 2 days for a check-up. They must obtain records for further management.   Do not hesitate to return to the Emergency Department for any new, worsening or concerning symptoms.

## 2016-02-03 NOTE — ED Notes (Signed)
Pt here for abscess to right forearm.

## 2016-02-03 NOTE — ED Notes (Addendum)
Pt was going to leave AMA to go to urgent care or other facility for treatment of abcess. RN discussed his symptoms with N. Pisciotta, NP. She assessed his arm and deemed it FT appropriate.

## 2016-02-11 ENCOUNTER — Emergency Department (HOSPITAL_COMMUNITY)
Admission: EM | Admit: 2016-02-11 | Discharge: 2016-02-11 | Disposition: A | Discharge status: Home / Self Care | Payer: Medicare Other | Attending: Emergency Medicine | Admitting: Emergency Medicine

## 2016-02-11 ENCOUNTER — Emergency Department (HOSPITAL_COMMUNITY): Payer: Medicare Other

## 2016-02-11 ENCOUNTER — Encounter (HOSPITAL_COMMUNITY): Payer: Self-pay | Admitting: Vascular Surgery

## 2016-02-11 DIAGNOSIS — R062 Wheezing: Secondary | ICD-10-CM

## 2016-02-11 DIAGNOSIS — J449 Chronic obstructive pulmonary disease, unspecified: Secondary | ICD-10-CM | POA: Diagnosis not present

## 2016-02-11 DIAGNOSIS — N182 Chronic kidney disease, stage 2 (mild): Secondary | ICD-10-CM | POA: Insufficient documentation

## 2016-02-11 DIAGNOSIS — R071 Chest pain on breathing: Secondary | ICD-10-CM | POA: Diagnosis not present

## 2016-02-11 DIAGNOSIS — R079 Chest pain, unspecified: Secondary | ICD-10-CM

## 2016-02-11 DIAGNOSIS — R0789 Other chest pain: Secondary | ICD-10-CM | POA: Diagnosis not present

## 2016-02-11 DIAGNOSIS — Z85118 Personal history of other malignant neoplasm of bronchus and lung: Secondary | ICD-10-CM | POA: Diagnosis not present

## 2016-02-11 DIAGNOSIS — I130 Hypertensive heart and chronic kidney disease with heart failure and stage 1 through stage 4 chronic kidney disease, or unspecified chronic kidney disease: Secondary | ICD-10-CM | POA: Insufficient documentation

## 2016-02-11 DIAGNOSIS — Z87891 Personal history of nicotine dependence: Secondary | ICD-10-CM | POA: Diagnosis not present

## 2016-02-11 DIAGNOSIS — I5032 Chronic diastolic (congestive) heart failure: Secondary | ICD-10-CM | POA: Diagnosis not present

## 2016-02-11 LAB — BASIC METABOLIC PANEL
Anion gap: 7 (ref 5–15)
BUN: 10 mg/dL (ref 6–20)
CHLORIDE: 101 mmol/L (ref 101–111)
CO2: 31 mmol/L (ref 22–32)
CREATININE: 1.07 mg/dL (ref 0.61–1.24)
Calcium: 8.9 mg/dL (ref 8.9–10.3)
GFR calc Af Amer: >60 mL/min (ref >60)
GFR calc non Af Amer: >60 mL/min (ref >60)
Glucose, Bld: 99 mg/dL (ref 65–99)
Potassium: 4.3 mmol/L (ref 3.5–5.1)
Sodium: 139 mmol/L (ref 135–145)

## 2016-02-11 LAB — CBC
HCT: 34.9 % — ABNORMAL LOW (ref 39.0–52.0)
Hemoglobin: 10.6 g/dL — ABNORMAL LOW (ref 13.0–17.0)
MCH: 26.2 pg (ref 26.0–34.0)
MCHC: 30.4 g/dL (ref 30.0–36.0)
MCV: 86.4 fL (ref 78.0–100.0)
PLATELETS: 230 10*3/uL (ref 150–400)
RBC: 4.04 MIL/uL — AB (ref 4.22–5.81)
RDW: 14.9 % (ref 11.5–15.5)
WBC: 6 10*3/uL (ref 4.0–10.5)

## 2016-02-11 LAB — I-STAT TROPONIN, ED: Troponin i, poc: 0 ng/mL (ref 0.00–0.08)

## 2016-02-11 MED ORDER — IPRATROPIUM BROMIDE 0.02 % IN SOLN
0.5000 mg | Freq: Once | RESPIRATORY_TRACT | Status: AC
  Administered 2016-02-11: 0.5 mg via RESPIRATORY_TRACT
  Filled 2016-02-11: qty 2.5

## 2016-02-11 MED ORDER — CARVEDILOL 12.5 MG PO TABS
25.0000 mg | ORAL_TABLET | Freq: Once | ORAL | Status: AC
  Administered 2016-02-11: 25 mg via ORAL
  Filled 2016-02-11: qty 2

## 2016-02-11 MED ORDER — ALBUTEROL SULFATE HFA 108 (90 BASE) MCG/ACT IN AERS
1.0000 | INHALATION_SPRAY | RESPIRATORY_TRACT | Status: DC | PRN
  Administered 2016-02-11: 2 via RESPIRATORY_TRACT
  Filled 2016-02-11: qty 6.7

## 2016-02-11 MED ORDER — GI COCKTAIL ~~LOC~~
30.0000 mL | Freq: Once | ORAL | Status: AC
  Administered 2016-02-11: 30 mL via ORAL
  Filled 2016-02-11: qty 30

## 2016-02-11 MED ORDER — PREDNISONE 20 MG PO TABS: 40.0000 mg | ORAL_TABLET | Freq: Every day | ORAL | 0 refills | Status: DC

## 2016-02-11 MED ORDER — PREDNISONE 20 MG PO TABS
60.0000 mg | ORAL_TABLET | Freq: Once | ORAL | Status: AC
  Administered 2016-02-11: 60 mg via ORAL
  Filled 2016-02-11: qty 3

## 2016-02-11 MED ORDER — ALBUTEROL SULFATE (2.5 MG/3ML) 0.083% IN NEBU
5.0000 mg | INHALATION_SOLUTION | Freq: Once | RESPIRATORY_TRACT | Status: AC
  Administered 2016-02-11: 5 mg via RESPIRATORY_TRACT
  Filled 2016-02-11: qty 6

## 2016-02-11 NOTE — ED Triage Notes (Signed)
Pt reports to the ED for eval of chest pain and burning that started yesterday and when he would take an [place] it would go away but once that wore off it would come back. Pt reports the pain starts in his throat and goes all the way down into his stomach. Pt reports some associated nausea but denies any other associated symptoms.
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
## 2016-02-15 ENCOUNTER — Encounter (HOSPITAL_COMMUNITY): Payer: Self-pay

## 2016-02-15 ENCOUNTER — Other Ambulatory Visit (HOSPITAL_BASED_OUTPATIENT_CLINIC_OR_DEPARTMENT_OTHER): Payer: Medicare Other

## 2016-02-15 ENCOUNTER — Telehealth: Payer: Self-pay | Admitting: Internal Medicine

## 2016-02-15 ENCOUNTER — Ambulatory Visit (HOSPITAL_COMMUNITY)
Admission: RE | Admit: 2016-02-15 | Discharge: 2016-02-15 | Disposition: A | Discharge status: Home / Self Care | Payer: Medicare Other | Source: Ambulatory Visit | Attending: Internal Medicine | Admitting: Internal Medicine

## 2016-02-15 DIAGNOSIS — C3411 Malignant neoplasm of upper lobe, right bronchus or lung: Secondary | ICD-10-CM | POA: Insufficient documentation

## 2016-02-15 DIAGNOSIS — R599 Enlarged lymph nodes, unspecified: Secondary | ICD-10-CM | POA: Diagnosis not present

## 2016-02-15 DIAGNOSIS — C3490 Malignant neoplasm of unspecified part of unspecified bronchus or lung: Secondary | ICD-10-CM | POA: Diagnosis not present

## 2016-02-15 LAB — CBC WITH DIFFERENTIAL/PLATELET
BASO%: 0.5 % (ref 0.0–2.0)
BASOS ABS: 0 10*3/uL (ref 0.0–0.1)
EOS%: 0.4 % (ref 0.0–7.0)
Eosinophils Absolute: 0 10*3/uL (ref 0.0–0.5)
HCT: 36 % — ABNORMAL LOW (ref 38.4–49.9)
HGB: 11.5 g/dL — ABNORMAL LOW (ref 13.0–17.1)
LYMPH#: 0.9 10*3/uL (ref 0.9–3.3)
LYMPH%: 8.7 % — ABNORMAL LOW (ref 14.0–49.0)
MCH: 26.6 pg — AB (ref 27.2–33.4)
MCHC: 31.8 g/dL — ABNORMAL LOW (ref 32.0–36.0)
MCV: 83.7 fL (ref 79.3–98.0)
MONO#: 0.3 10*3/uL (ref 0.1–0.9)
MONO%: 3.3 % (ref 0.0–14.0)
NEUT%: 87.1 % — ABNORMAL HIGH (ref 39.0–75.0)
NEUTROS ABS: 8.7 10*3/uL — AB (ref 1.5–6.5)
PLATELETS: 249 10*3/uL (ref 140–400)
RBC: 4.3 10*6/uL (ref 4.20–5.82)
RDW: 15.7 % — AB (ref 11.0–14.6)
WBC: 10 10*3/uL (ref 4.0–10.3)

## 2016-02-15 LAB — COMPREHENSIVE METABOLIC PANEL
ALBUMIN: 3.1 g/dL — AB (ref 3.5–5.0)
ALK PHOS: 72 U/L (ref 40–150)
ALT: 13 U/L (ref 0–55)
ANION GAP: 8 meq/L (ref 3–11)
AST: 14 U/L (ref 5–34)
BUN: 18 mg/dL (ref 7.0–26.0)
CO2: 28 meq/L (ref 22–29)
Calcium: 8.8 mg/dL (ref 8.4–10.4)
Chloride: 106 mEq/L (ref 98–109)
Creatinine: 1.1 mg/dL (ref 0.7–1.3)
EGFR: 71 mL/min/{1.73_m2} — AB (ref >90)
GLUCOSE: 119 mg/dL (ref 70–140)
POTASSIUM: 4.2 meq/L (ref 3.5–5.1)
SODIUM: 141 meq/L (ref 136–145)
Total Bilirubin: 0.37 mg/dL (ref 0.20–1.20)
Total Protein: 6.8 g/dL (ref 6.4–8.3)

## 2016-02-15 MED ORDER — IOPAMIDOL (ISOVUE-300) INJECTION 61%
75.0000 mL | Freq: Once | INTRAVENOUS | Status: AC | PRN
  Administered 2016-02-15: 75 mL via INTRAVENOUS

## 2016-02-15 NOTE — Assessment & Plan Note (Addendum)
Recently seen in the ED and felt to have worsened SOB secondary to his lung disease. He was reportedly out of his inhaler and has not had one in several years. Symptoms improved with nebulizer treatment and steroids.   Using albuterol inhaler every other day, 1-2 times. He was previously on Advair. Did not have any improvement on Advair. Has been off all inhalers since 2014. Denies any coughing. Occasional SOB.   Plan Continue Albuterol prn

## 2016-02-15 NOTE — Assessment & Plan Note (Addendum)
Currently asymptomatic. Denies any shortness of breath, orthopnea or PND. No complaints of LE edema today.   Plan Never scheduled repeat ECHO. Will attempt to schedule. Continue Lasix 20 mg daily

## 2016-02-15 NOTE — Assessment & Plan Note (Signed)
Patient is status post concurrent chemotherapy and radiation treatments in the past. He completed 3 cycles of carboplatin/Taxol chemotherapy in November 2016. He is currently undergoing observation only.  Due for repeat CT scan this month and has follow up appointment with Dr. Julien Nordmann 02/22/2016.

## 2016-02-15 NOTE — Assessment & Plan Note (Signed)
Vit D level 19.8 on 03/2015. Currently off supplementation. Will restart Vit D 1000 units daily.   Will recheck level today.

## 2016-02-15 NOTE — Assessment & Plan Note (Addendum)
Patient continues to complain of his back/joint pain. He reports is using his Norco 10 mg four times daily, with some relief.He last used his pills yesterday.Last UDS done 07/2015 with appropriate results. Review of the [place] [employer] database shows no aberations with appropriate refills.   Average pain in the last week: 8/10 Average of pain affecting enjoyment in last week: 9/10 Average of pain affecting activity in last week: 8/10  Have discussed PT, muscle relaxant and injections at prior visits. Reports having all done in the past with no improvement. No interest in trying any of these again  Uses Voltaren gel with some relief. Has been unable to get the voltaren recently. Reports the pharmacy required something from the clinic before they could give it to him. No paperwork sent here. He says he was able to fill the Rx recently.   A/P: OA and chronic pain. Will refill pain medications for 3 months supply. - Norco 10-325 mg #120 - UDS today - Continue voltaren prn

## 2016-02-15 NOTE — Assessment & Plan Note (Addendum)
BP Readings from Last 3 Encounters:  02/16/16 120/63  02/11/16 112/96  02/03/16 180/83    Lab Results  Component Value Date   NA 141 02/15/2016   K 4.2 02/15/2016   CREATININE 1.1 02/15/2016    Assessment: Patient currently on Coreg 25 mg bid and enalapril 20 mg bid as well as Lasix 20 mg daily.  BP today is 120/63.   Plan: Continue current medications

## 2016-02-15 NOTE — Telephone Encounter (Signed)
APT. REMINDER CALL, LMTCB °

## 2016-02-16 ENCOUNTER — Encounter: Payer: Self-pay | Admitting: Internal Medicine

## 2016-02-16 ENCOUNTER — Ambulatory Visit (INDEPENDENT_AMBULATORY_CARE_PROVIDER_SITE_OTHER): Payer: Medicare Other | Admitting: Internal Medicine

## 2016-02-16 VITALS — BP 120/63 | HR 68 | Temp 98.1°F | Ht 71.0 in | Wt 244.3 lb

## 2016-02-16 DIAGNOSIS — Z23 Encounter for immunization: Secondary | ICD-10-CM

## 2016-02-16 DIAGNOSIS — L0291 Cutaneous abscess, unspecified: Secondary | ICD-10-CM

## 2016-02-16 DIAGNOSIS — C3411 Malignant neoplasm of upper lobe, right bronchus or lung: Secondary | ICD-10-CM

## 2016-02-16 DIAGNOSIS — M150 Primary generalized (osteo)arthritis: Secondary | ICD-10-CM | POA: Diagnosis not present

## 2016-02-16 DIAGNOSIS — M479 Spondylosis, unspecified: Secondary | ICD-10-CM

## 2016-02-16 DIAGNOSIS — E559 Vitamin D deficiency, unspecified: Secondary | ICD-10-CM | POA: Diagnosis not present

## 2016-02-16 DIAGNOSIS — R1310 Dysphagia, unspecified: Secondary | ICD-10-CM | POA: Diagnosis not present

## 2016-02-16 DIAGNOSIS — Z79891 Long term (current) use of opiate analgesic: Secondary | ICD-10-CM | POA: Diagnosis not present

## 2016-02-16 DIAGNOSIS — Z9221 Personal history of antineoplastic chemotherapy: Secondary | ICD-10-CM

## 2016-02-16 DIAGNOSIS — Z87891 Personal history of nicotine dependence: Secondary | ICD-10-CM

## 2016-02-16 DIAGNOSIS — I110 Hypertensive heart disease with heart failure: Secondary | ICD-10-CM

## 2016-02-16 DIAGNOSIS — I10 Essential (primary) hypertension: Secondary | ICD-10-CM

## 2016-02-16 DIAGNOSIS — M159 Polyosteoarthritis, unspecified: Secondary | ICD-10-CM

## 2016-02-16 DIAGNOSIS — J984 Other disorders of lung: Secondary | ICD-10-CM

## 2016-02-16 DIAGNOSIS — Z923 Personal history of irradiation: Secondary | ICD-10-CM

## 2016-02-16 DIAGNOSIS — I5032 Chronic diastolic (congestive) heart failure: Secondary | ICD-10-CM | POA: Diagnosis not present

## 2016-02-16 DIAGNOSIS — Z0000 Encounter for general adult medical examination without abnormal findings: Secondary | ICD-10-CM

## 2016-02-16 DIAGNOSIS — Z872 Personal history of diseases of the skin and subcutaneous tissue: Secondary | ICD-10-CM

## 2016-02-16 MED ORDER — HYDROCODONE-ACETAMINOPHEN 10-325 MG PO TABS: 1.0000 | ORAL_TABLET | Freq: Four times a day (QID) | ORAL | 0 refills | Status: DC | PRN

## 2016-02-16 MED ORDER — CARVEDILOL 25 MG PO TABS: 25.0000 mg | ORAL_TABLET | Freq: Two times a day (BID) | ORAL | 0 refills | Status: DC

## 2016-02-16 MED ORDER — VITAMIN D (CHOLECALCIFEROL) 25 MCG (1000 UT) PO CAPS: 1000.0000 [IU] | ORAL_CAPSULE | Freq: Every day | ORAL | 2 refills | Status: DC

## 2016-02-16 MED ORDER — HYDROCODONE-ACETAMINOPHEN 10-325 MG PO TABS
1.0000 | ORAL_TABLET | Freq: Four times a day (QID) | ORAL | 0 refills | Status: DC | PRN
Start: 2016-02-16 — End: 2016-02-16

## 2016-02-16 MED ORDER — PANTOPRAZOLE SODIUM 40 MG PO TBEC: DELAYED_RELEASE_TABLET | ORAL | 2 refills | Status: DC

## 2016-02-16 MED ORDER — ALBUTEROL SULFATE HFA 108 (90 BASE) MCG/ACT IN AERS: 2.0000 | INHALATION_SPRAY | Freq: Four times a day (QID) | RESPIRATORY_TRACT | 2 refills | Status: DC | PRN

## 2016-02-16 MED ORDER — FUROSEMIDE 20 MG PO TABS: 20.0000 mg | ORAL_TABLET | Freq: Every day | ORAL | 2 refills | Status: DC

## 2016-02-16 MED ORDER — GABAPENTIN 600 MG PO TABS: 600.0000 mg | ORAL_TABLET | Freq: Two times a day (BID) | ORAL | 3 refills | Status: DC

## 2016-02-16 MED ORDER — HYDROCODONE-ACETAMINOPHEN 10-325 MG PO TABS
1.0000 | ORAL_TABLET | Freq: Four times a day (QID) | ORAL | 0 refills | Status: DC | PRN
Start: 2016-02-16 — End: 2016-06-05

## 2016-02-16 MED ORDER — SUCRALFATE 1 G PO TABS: 1.0000 g | ORAL_TABLET | Freq: Two times a day (BID) | ORAL | 1 refills | Status: DC

## 2016-02-16 MED ORDER — DICLOFENAC SODIUM 1 % TD GEL: TRANSDERMAL | 5 refills | Status: AC

## 2016-02-16 NOTE — Patient Instructions (Addendum)
Nicholas [name],  For your pain with swallowing, I am going to refer you to a stomach doctor for further evaluation. It is likely that you will need a procedure called an Endoscopy where they will put a camera down your throat. This will evaluate for possible causes of your pain.   I would like to see you back in 3 months for follow up.

## 2016-02-16 NOTE — Assessment & Plan Note (Addendum)
Says that he has been having problems with solid foods causing pain with eating. He has no problems with liquids. No difficulty swallowing. Pain down throat and migrates down to chest and abdomen. Had similar problems while on chemo and radiation. Symptoms began on 02/10/16 evening and have been ongoing since. Unable to describe the pain further. No problems with soft foods like mashed potatoes or soups. No GERD symptoms. No nausea/vomting. No cough. Denies any globus sensation.   Suspect this is secondary to his radiation therapy.   Plan Will refer to GI for evaluation for possible EGD Sucralfate tid
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
## 2016-02-16 NOTE — Assessment & Plan Note (Signed)
Flu

## 2016-02-16 NOTE — Assessment & Plan Note (Signed)
Seen in ED on 9/28. Underwent I&D and given course of clindamycin. Today, he has a healing wound, no signs of active infection.

## 2016-02-17 LAB — VITAMIN D 25 HYDROXY (VIT D DEFICIENCY, FRACTURES): Vit D, 25-Hydroxy: 14 ng/mL — ABNORMAL LOW (ref 30.0–100.0)

## 2016-02-18 DIAGNOSIS — J449 Chronic obstructive pulmonary disease, unspecified: Secondary | ICD-10-CM | POA: Diagnosis not present

## 2016-02-22 ENCOUNTER — Telehealth: Payer: Self-pay | Admitting: Internal Medicine

## 2016-02-22 ENCOUNTER — Encounter: Payer: Self-pay | Admitting: *Deleted

## 2016-02-22 ENCOUNTER — Ambulatory Visit (HOSPITAL_BASED_OUTPATIENT_CLINIC_OR_DEPARTMENT_OTHER): Payer: Medicare Other | Admitting: Internal Medicine

## 2016-02-22 ENCOUNTER — Encounter: Payer: Self-pay | Admitting: Internal Medicine

## 2016-02-22 VITALS — BP 162/75 | HR 53 | Temp 98.5°F | Resp 16 | Ht 71.0 in | Wt 242.3 lb

## 2016-02-22 DIAGNOSIS — C3411 Malignant neoplasm of upper lobe, right bronchus or lung: Secondary | ICD-10-CM | POA: Diagnosis not present

## 2016-02-22 NOTE — Progress Notes (Signed)
Oncology Nurse Navigator Documentation  Oncology Nurse Navigator Flowsheets 02/22/2016  Navigator Location [facility]  Navigator Encounter Type Clinic/MDC/I followed up with Nicholas [name] and his daughter today.  He is doing well without complaints.  Patient is on consolidation and no barriers identified.    Patient Visit Type Follow-up  Treatment Phase Other  Barriers/Navigation Needs No barriers at this time  Interventions None required  Acuity Level 1  Acuity Level 1 Minimal follow up required  Time Spent with Patient 15
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
## 2016-02-22 NOTE — Telephone Encounter (Signed)
Gave relative avs report and appointments for February. [facility] will call re scan.

## 2016-02-22 NOTE — Progress Notes (Signed)
Internal Medicine Clinic Attending  Case discussed with Dr. Charlynn Grimes soon after the resident saw the patient.  We reviewed the resident's history and exam and pertinent patient test results.  I agree with the assessment, diagnosis, and plan of care documented in the resident's note.

## 2016-02-24 LAB — TOXASSURE SELECT,+ANTIDEPR,UR

## 2016-03-02 ENCOUNTER — Other Ambulatory Visit: Payer: Self-pay | Admitting: Internal Medicine

## 2016-03-02 DIAGNOSIS — I10 Essential (primary) hypertension: Secondary | ICD-10-CM

## 2016-03-20 DIAGNOSIS — J449 Chronic obstructive pulmonary disease, unspecified: Secondary | ICD-10-CM | POA: Diagnosis not present

## 2016-03-22 DIAGNOSIS — R1310 Dysphagia, unspecified: Secondary | ICD-10-CM | POA: Diagnosis not present

## 2016-04-15 ENCOUNTER — Other Ambulatory Visit: Payer: Self-pay | Admitting: Internal Medicine

## 2016-04-19 DIAGNOSIS — J449 Chronic obstructive pulmonary disease, unspecified: Secondary | ICD-10-CM | POA: Diagnosis not present

## 2016-05-16 ENCOUNTER — Encounter: Payer: Self-pay | Admitting: Internal Medicine

## 2016-05-16 DIAGNOSIS — F1120 Opioid dependence, uncomplicated: Secondary | ICD-10-CM | POA: Insufficient documentation

## 2016-05-18 ENCOUNTER — Other Ambulatory Visit: Payer: Self-pay | Admitting: Internal Medicine

## 2016-05-18 DIAGNOSIS — E785 Hyperlipidemia, unspecified: Secondary | ICD-10-CM

## 2016-05-20 DIAGNOSIS — J449 Chronic obstructive pulmonary disease, unspecified: Secondary | ICD-10-CM | POA: Diagnosis not present

## 2016-05-23 ENCOUNTER — Other Ambulatory Visit: Payer: Self-pay | Admitting: *Deleted

## 2016-05-25 MED ORDER — SUCRALFATE 1 G PO TABS: 1.0000 g | ORAL_TABLET | Freq: Two times a day (BID) | ORAL | 1 refills | Status: DC

## 2016-05-31 ENCOUNTER — Telehealth: Payer: Self-pay | Admitting: Internal Medicine

## 2016-05-31 NOTE — Telephone Encounter (Signed)
Would like to discuss a medication if the patient is taking it or not. patient is unsure

## 2016-05-31 NOTE — Telephone Encounter (Signed)
Called Nicholas [name] back, verified that pt has a good script for crestor- done 01/11/16 #90 w/ 3 refills, she will check w// pt and see if she can get him to be more compliant with crestor

## 2016-06-05 ENCOUNTER — Telehealth: Payer: Self-pay | Admitting: *Deleted

## 2016-06-05 ENCOUNTER — Other Ambulatory Visit: Payer: Self-pay | Admitting: Internal Medicine

## 2016-06-05 DIAGNOSIS — M159 Polyosteoarthritis, unspecified: Secondary | ICD-10-CM

## 2016-06-05 DIAGNOSIS — M150 Primary generalized (osteo)arthritis: Principal | ICD-10-CM

## 2016-06-05 NOTE — Telephone Encounter (Addendum)
Information was sent to CoverMyMeds for [place] for DiclofenacGel 1%.  Sander Nephew, RN 06/05/2016 3:07 PM Fax from [employer] .  Diclofenac Gel approved thru 12//31/2018.  Regino Schultze [place],RN 06/05/2016 11:41AM

## 2016-06-05 NOTE — Telephone Encounter (Signed)
HYDROcodone-acetaminophen (NORCO) 10-325 MG tablet °

## 2016-06-05 NOTE — Telephone Encounter (Signed)
Please call pt back regarding pain med.  

## 2016-06-05 NOTE — Telephone Encounter (Signed)
Last office visit: 02/16/2016 Last UDS: 02/16/2016 (UDS appropriate) Last Refill: 05/06/2017 #120 (confirmed with phamacy) Next appt: 07/04/2016

## 2016-06-06 MED ORDER — HYDROCODONE-ACETAMINOPHEN 10-325 MG PO TABS: 1.0000 | ORAL_TABLET | Freq: Four times a day (QID) | ORAL | 0 refills | Status: DC | PRN

## 2016-06-06 NOTE — Telephone Encounter (Signed)
Refill x 1 month. Has follow up on 2/27.

## 2016-06-22 ENCOUNTER — Other Ambulatory Visit (HOSPITAL_BASED_OUTPATIENT_CLINIC_OR_DEPARTMENT_OTHER): Payer: Medicare HMO

## 2016-06-22 ENCOUNTER — Ambulatory Visit (HOSPITAL_COMMUNITY)
Admission: RE | Admit: 2016-06-22 | Discharge: 2016-06-22 | Disposition: A | Discharge status: Home / Self Care | Payer: Medicare HMO | Source: Ambulatory Visit | Attending: Internal Medicine | Admitting: Internal Medicine

## 2016-06-22 DIAGNOSIS — I2510 Atherosclerotic heart disease of native coronary artery without angina pectoris: Secondary | ICD-10-CM | POA: Insufficient documentation

## 2016-06-22 DIAGNOSIS — I700 Atherosclerosis of aorta: Secondary | ICD-10-CM | POA: Diagnosis not present

## 2016-06-22 DIAGNOSIS — C3411 Malignant neoplasm of upper lobe, right bronchus or lung: Secondary | ICD-10-CM | POA: Diagnosis not present

## 2016-06-22 LAB — COMPREHENSIVE METABOLIC PANEL
ALT: 11 U/L (ref 0–55)
ANION GAP: 10 meq/L (ref 3–11)
AST: 13 U/L (ref 5–34)
Albumin: 2.9 g/dL — ABNORMAL LOW (ref 3.5–5.0)
Alkaline Phosphatase: 72 U/L (ref 40–150)
BILIRUBIN TOTAL: 0.29 mg/dL (ref 0.20–1.20)
BUN: 18.9 mg/dL (ref 7.0–26.0)
CHLORIDE: 99 meq/L (ref 98–109)
CO2: 29 meq/L (ref 22–29)
CREATININE: 1.1 mg/dL (ref 0.7–1.3)
Calcium: 8.5 mg/dL (ref 8.4–10.4)
EGFR: 75 mL/min/{1.73_m2} — ABNORMAL LOW (ref >90)
GLUCOSE: 133 mg/dL (ref 70–140)
Potassium: 3.5 mEq/L (ref 3.5–5.1)
SODIUM: 138 meq/L (ref 136–145)
TOTAL PROTEIN: 6.7 g/dL (ref 6.4–8.3)

## 2016-06-22 LAB — CBC WITH DIFFERENTIAL/PLATELET
BASO%: 0.6 % (ref 0.0–2.0)
Basophils Absolute: 0 10*3/uL (ref 0.0–0.1)
EOS%: 7.5 % — AB (ref 0.0–7.0)
Eosinophils Absolute: 0.3 10*3/uL (ref 0.0–0.5)
HCT: 29.9 % — ABNORMAL LOW (ref 38.4–49.9)
HGB: 9.3 g/dL — ABNORMAL LOW (ref 13.0–17.1)
LYMPH%: 30.3 % (ref 14.0–49.0)
MCH: 25.8 pg — ABNORMAL LOW (ref 27.2–33.4)
MCHC: 31.1 g/dL — AB (ref 32.0–36.0)
MCV: 82.8 fL (ref 79.3–98.0)
MONO#: 0.5 10*3/uL (ref 0.1–0.9)
MONO%: 16.2 % — AB (ref 0.0–14.0)
NEUT%: 45.4 % (ref 39.0–75.0)
NEUTROS ABS: 1.5 10*3/uL (ref 1.5–6.5)
Platelets: 193 10*3/uL (ref 140–400)
RBC: 3.61 10*6/uL — AB (ref 4.20–5.82)
RDW: 15 % — ABNORMAL HIGH (ref 11.0–14.6)
WBC: 3.3 10*3/uL — AB (ref 4.0–10.3)
lymph#: 1 10*3/uL (ref 0.9–3.3)

## 2016-06-26 ENCOUNTER — Telehealth: Payer: Self-pay

## 2016-06-26 NOTE — Telephone Encounter (Signed)
Charlene from [facility] had talked to pt who told her about his legs swelling. He told her he has not been taking Enalapril and Lasix b/c they are making him go to the bathroom too often (urinating). She enforced to the pt of the importance of taking his medications.  Called pt - no answer; then called other # -his daughter,Caroline answered. She was able to call pt on another phone - stated he was bathing. Said he taking one pill for BP that has fluid med in it then added another fluid pill which was causing him to go to BR frequently. But after talking to someone [facility]; he started back taking his meds.  He has an appt on 2/27.

## 2016-06-26 NOTE — Telephone Encounter (Signed)
Charlene from [employer] needs to speak with a nurse about pt. Please call [phone],  press option 1 [phone].

## 2016-06-29 ENCOUNTER — Ambulatory Visit (HOSPITAL_BASED_OUTPATIENT_CLINIC_OR_DEPARTMENT_OTHER): Payer: Medicare HMO | Admitting: Internal Medicine

## 2016-06-29 ENCOUNTER — Telehealth: Payer: Self-pay | Admitting: Internal Medicine

## 2016-06-29 VITALS — BP 174/91 | HR 108 | Temp 98.9°F | Resp 18 | Ht 71.0 in | Wt 241.4 lb

## 2016-06-29 DIAGNOSIS — C3411 Malignant neoplasm of upper lobe, right bronchus or lung: Secondary | ICD-10-CM | POA: Diagnosis not present
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
## 2016-06-29 NOTE — Telephone Encounter (Signed)
Appointments scheduled per 2/22 LOS. Patient given AVS report and calendars with future scheduled appointments. °

## 2016-06-30 ENCOUNTER — Encounter: Payer: Self-pay | Admitting: Internal Medicine

## 2016-07-04 ENCOUNTER — Encounter: Payer: Self-pay | Admitting: Internal Medicine

## 2016-07-04 ENCOUNTER — Telehealth: Payer: Self-pay | Admitting: Internal Medicine

## 2016-07-04 ENCOUNTER — Telehealth: Payer: Self-pay | Admitting: *Deleted

## 2016-07-04 ENCOUNTER — Ambulatory Visit (INDEPENDENT_AMBULATORY_CARE_PROVIDER_SITE_OTHER): Payer: Medicare HMO | Admitting: Internal Medicine

## 2016-07-04 VITALS — BP 148/60 | HR 80 | Temp 98.8°F | Ht 71.0 in | Wt 231.7 lb

## 2016-07-04 DIAGNOSIS — R1310 Dysphagia, unspecified: Secondary | ICD-10-CM

## 2016-07-04 DIAGNOSIS — F1120 Opioid dependence, uncomplicated: Secondary | ICD-10-CM

## 2016-07-04 DIAGNOSIS — Z79899 Other long term (current) drug therapy: Secondary | ICD-10-CM

## 2016-07-04 DIAGNOSIS — G8929 Other chronic pain: Secondary | ICD-10-CM

## 2016-07-04 DIAGNOSIS — C3411 Malignant neoplasm of upper lobe, right bronchus or lung: Secondary | ICD-10-CM

## 2016-07-04 DIAGNOSIS — Z87891 Personal history of nicotine dependence: Secondary | ICD-10-CM

## 2016-07-04 DIAGNOSIS — I10 Essential (primary) hypertension: Secondary | ICD-10-CM

## 2016-07-04 DIAGNOSIS — Z87898 Personal history of other specified conditions: Secondary | ICD-10-CM

## 2016-07-04 DIAGNOSIS — Z79891 Long term (current) use of opiate analgesic: Secondary | ICD-10-CM | POA: Diagnosis not present

## 2016-07-04 MED ORDER — HYDROCODONE-ACETAMINOPHEN 10-325 MG PO TABS: 1.0000 | ORAL_TABLET | ORAL | 0 refills | Status: DC | PRN

## 2016-07-04 NOTE — Patient Instructions (Signed)
Nicholas [name],   It was a pleasure seeing you today. Please follow up with me in 3 months.

## 2016-07-04 NOTE — Telephone Encounter (Signed)
   Reason for call:   I received a call from [name] at 5:08 PM indicating that he could not fill his prescription of Norco.   Pertinent Data:   Patient seen by his PCP, Dr. Charlynn [name] today (07/04/2016) in clinic.  Patient had his Hydrocodone-Acetaminophen 10-325 mg q4h prn refilled today.  Patient reports using the [facility] on [address] and that he has the printed prescription in his possession.  Patient requested early refill today, and per chart, verbal order for early refill was given by PCP Dr. Charlynn [name] to RN Sander Nephew who did call [place] and spoke to Santiago Glad to okay the early refill.  Patient states that pharmacy told him that they did not receive the documented call.   Assessment / Plan / Recommendations:   Patient on chronic pain medications requesting early refill which was approved by PCP per chart notes. I will forward to PCP and advised patient that it may be until tomorrow before approval. He understands and agrees.  As always, pt is advised that if symptoms worsen or new symptoms arise, they should go to an urgent care facility or to to ER for further evaluation.   Zada Finders, MD   07/04/2016, 5:43 PM

## 2016-07-04 NOTE — Telephone Encounter (Signed)
Call to [facility] per order of Dr. Charlynn Grimes.  Ok'd early refill for patients pain med .  Spoke with Santiago Glad and given order to fill early.  Sander Nephew, RN 07/04/2016 4:10 PM
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
## 2016-07-07 NOTE — Assessment & Plan Note (Signed)
Karl Bales is on chronic opioid therapy for chronic pain. The date of the controlled substances contract is referenced in the [facility] and / or the overview. As part of the treatment plan, the [place] controlled substance database is checked at least twice yearly and the database results are appropriate. I have last reviewed the results on 07/04/16.   The last UDS was on 02/2017 and results are as expected. Patient needs at least a yearly UDS.   The patient is on Hydrocodone strength 10 mg, 120 per 30 days.   Mr. Kaimen reports today that his pain is not well controlled. He says that if he uses 4 in the morning his pain is controlled until at night and he requires another 2 pills. When using it this way his pain is well controlled.   "The benefits of continuing opioid therapy outweigh the risks and chronic opioids will be continued. Ongoing education about safe opioid treatment is provided  Interventions today include: Increased Hydrocodone 10 mg #120 >> # 180 today.  Continue Voltaren gel UDS at next visit

## 2016-07-07 NOTE — Assessment & Plan Note (Signed)
Followed by Dr. Julien Nordmann. Most recent CT chest showed increase in the medial right upper lob/apcial mass like consolidation. Concerning for disease progression. He is scheduled to get a PET scan with follow up with Dr. Grayland Ormond after the scan. Denies any complaints today.   Assessment: Right Lung Cancer  Plan: Follow up with Dr. Julien Nordmann

## 2016-07-07 NOTE — Assessment & Plan Note (Signed)
BP Readings from Last 3 Encounters:  07/04/16 (!) 148/60  06/29/16 (!) 174/91  02/22/16 (!) 162/75    Lab Results  Component Value Date   NA 138 06/22/2016   K 3.5 06/22/2016   CREATININE 1.1 06/22/2016   BP today is 149/60. He is currently on Coreg 25 mg bid, enalapril 40 mg daily and lasix 20 mg daily. Denies any headaches, chest pain, shortness of breath, nausea/vomiting.   Assessment: Worsened HTN  Plan: Goal BP <150/90. Continue current medications.

## 2016-07-07 NOTE — Progress Notes (Signed)
Internal Medicine Clinic Attending  Case discussed with Dr. Boswell at the time of the visit.  We reviewed the resident's history and exam and pertinent patient test results.  I agree with the assessment, diagnosis, and plan of care documented in the resident's note.  

## 2016-07-07 NOTE — Assessment & Plan Note (Signed)
Reports that his pain has resolved. Has some reflux-type issues with spicy foods and is taking the sucralfate when he eats spicy things. Denies any pain with swallowing or difficulty swallowing. Food does not get stuck in his throat.   Assessment: Resolved odynphagia  Plan: Continue sucralfate prn

## 2016-07-13 ENCOUNTER — Other Ambulatory Visit: Payer: Self-pay | Admitting: Internal Medicine

## 2016-07-14 ENCOUNTER — Telehealth: Payer: Self-pay | Admitting: Internal Medicine

## 2016-07-14 NOTE — Telephone Encounter (Signed)
sw pt to confirm r/s appt to 3/26 at 1130 per LOS

## 2016-07-18 ENCOUNTER — Ambulatory Visit: Payer: Medicare HMO | Admitting: Internal Medicine

## 2016-07-21 ENCOUNTER — Telehealth: Payer: Self-pay | Admitting: Internal Medicine

## 2016-07-21 ENCOUNTER — Ambulatory Visit (HOSPITAL_COMMUNITY): Payer: Medicare HMO

## 2016-07-21 NOTE — Telephone Encounter (Signed)
Patient rescheduled follow up for PET results to 08/08/16, per PET was rescheduled to 08/03/16.

## 2016-07-31 ENCOUNTER — Ambulatory Visit: Payer: Medicare HMO | Admitting: Internal Medicine

## 2016-08-03 ENCOUNTER — Encounter (HOSPITAL_COMMUNITY)
Admission: RE | Admit: 2016-08-03 | Discharge: 2016-08-03 | Disposition: A | Discharge status: Home / Self Care | Payer: Medicare HMO | Source: Ambulatory Visit | Attending: Internal Medicine | Admitting: Internal Medicine

## 2016-08-03 DIAGNOSIS — R938 Abnormal findings on diagnostic imaging of other specified body structures: Secondary | ICD-10-CM | POA: Diagnosis not present

## 2016-08-03 DIAGNOSIS — C3490 Malignant neoplasm of unspecified part of unspecified bronchus or lung: Secondary | ICD-10-CM | POA: Diagnosis not present

## 2016-08-03 DIAGNOSIS — C3411 Malignant neoplasm of upper lobe, right bronchus or lung: Secondary | ICD-10-CM | POA: Diagnosis not present

## 2016-08-03 LAB — GLUCOSE, CAPILLARY: Glucose-Capillary: 76 mg/dL (ref 65–99)

## 2016-08-03 MED ORDER — FLUDEOXYGLUCOSE F - 18 (FDG) INJECTION
11.5800 | Freq: Once | INTRAVENOUS | Status: AC | PRN
  Administered 2016-08-03: 11.58 via INTRAVENOUS

## 2016-08-08 ENCOUNTER — Ambulatory Visit (HOSPITAL_BASED_OUTPATIENT_CLINIC_OR_DEPARTMENT_OTHER): Payer: Medicare HMO | Admitting: Internal Medicine

## 2016-08-08 ENCOUNTER — Telehealth: Payer: Self-pay | Admitting: Internal Medicine

## 2016-08-08 ENCOUNTER — Encounter: Payer: Self-pay | Admitting: Internal Medicine

## 2016-08-08 DIAGNOSIS — C3411 Malignant neoplasm of upper lobe, right bronchus or lung: Secondary | ICD-10-CM

## 2016-08-08 DIAGNOSIS — I10 Essential (primary) hypertension: Secondary | ICD-10-CM

## 2016-08-08 NOTE — Telephone Encounter (Signed)
Gave patient AVS and calender per 08/08/2016 los. [facility] to contact patient with CT schedule,.
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
## 2016-08-13 ENCOUNTER — Other Ambulatory Visit: Payer: Self-pay | Admitting: Internal Medicine

## 2016-08-13 DIAGNOSIS — I10 Essential (primary) hypertension: Secondary | ICD-10-CM

## 2016-08-19 ENCOUNTER — Other Ambulatory Visit: Payer: Self-pay | Admitting: Internal Medicine

## 2016-08-21 ENCOUNTER — Telehealth: Payer: Self-pay | Admitting: *Deleted

## 2016-08-21 NOTE — Telephone Encounter (Signed)
Received faxed form from pt's pharmacy with the following message/request: "Patient may benefit from supplementing current asthma therapy with daily asthma controller therapy.  Request Rx to start patient on asthma controller therapy".    Will send request to pcp for review-of note, pt has an appt scheduled on 09/20/16 to see pcp..Please advise.Despina Hidden Cassady4/16/201811:44 AM

## 2016-08-22 NOTE — Telephone Encounter (Signed)
Do not think this is necessary. Thanks.

## 2016-08-25 ENCOUNTER — Encounter: Payer: Self-pay | Admitting: Internal Medicine

## 2016-08-25 ENCOUNTER — Ambulatory Visit (INDEPENDENT_AMBULATORY_CARE_PROVIDER_SITE_OTHER): Payer: Medicare HMO | Admitting: Internal Medicine

## 2016-08-25 DIAGNOSIS — Z9221 Personal history of antineoplastic chemotherapy: Secondary | ICD-10-CM | POA: Diagnosis not present

## 2016-08-25 DIAGNOSIS — Z923 Personal history of irradiation: Secondary | ICD-10-CM | POA: Diagnosis not present

## 2016-08-25 DIAGNOSIS — Z87891 Personal history of nicotine dependence: Secondary | ICD-10-CM | POA: Diagnosis not present

## 2016-08-25 DIAGNOSIS — Z85118 Personal history of other malignant neoplasm of bronchus and lung: Secondary | ICD-10-CM | POA: Diagnosis not present

## 2016-08-25 DIAGNOSIS — Z8679 Personal history of other diseases of the circulatory system: Secondary | ICD-10-CM | POA: Diagnosis not present

## 2016-08-25 DIAGNOSIS — R600 Localized edema: Secondary | ICD-10-CM

## 2016-08-25 LAB — POCT URINALYSIS DIPSTICK
GLUCOSE UA: NEGATIVE
Ketones, UA: NEGATIVE
LEUKOCYTES UA: NEGATIVE
NITRITE UA: NEGATIVE
PH UA: 5 (ref 5.0–8.0)
Protein, UA: NEGATIVE
RBC UA: NEGATIVE
Spec Grav, UA: 1.015 (ref 1.010–1.025)
Urobilinogen, UA: 1 E.U./dL

## 2016-08-25 MED ORDER — FUROSEMIDE 20 MG PO TABS: 40.0000 mg | ORAL_TABLET | Freq: Every day | ORAL | 2 refills | Status: DC

## 2016-08-25 NOTE — Assessment & Plan Note (Addendum)
Mr. Parkey presents to clinic today with complaints of bilateral lower extremity edema. He reports that he woke up 2 days ago with significant swelling in both of his legs. Initially was painful but the swelling has improved somewhat and he no longer has any pain. Denies any shortness of breath, orthopnea (stable 2 pills at night), PND, chest pain, abdominal pain or swelling. Takes lasix 20 mg daily and reports he does not feel like it makes him urinate much. Denies any urinary symptoms today.   His a history of diastolic heart failure; last ECHO 12/2013 with EF 55-60%, mildly increase PA pressure with no comment on diastolic function. ECHO from 2013 noted grade 1 diastolic dysfunction. History is also notable for Stage IIIA adenocarcinoma of the lung with treatment with chemo and radiation in 2016. Appears to have gotten carbopaltin and taxol at that time. Currently under observation.   Exam notable for 3+ pitting edema bilaterally to knees. No pain with palpitation. Lungs clear with no crackles. Abdomen soft, nontender with no gut edema. No JVD noted.   Most recent CMP from 06/2016 had an albumin of 2.9.   Assessment: bilateral lower extremity edema  Plan: Will increase lasix to 40 mg daily today Will repeat ECHO Checking urine protein to rule out nephrotic syndrome > urine dipstick with no protein detected Repeat CMET, may be some degree of malnutrition causing edmea Instructed patient to elevated legs while sitting  RTC in 1-2 weeks for follow up

## 2016-08-25 NOTE — Patient Instructions (Signed)
Nicholas [name],  I am going to check some urine studies today and some blood work. I would also like to get you scheduled to get a repeat ECHO done.   Please increase the dose of the lasix to 40 mg daily.  Follow up with me in 1-2 weeks
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
## 2016-08-25 NOTE — Progress Notes (Signed)
Medicine attending: Medical history, presenting problems, physical findings, and medications, reviewed with resident physician Dr Nathan Boswell on the day of the patient visit and I concur with his evaluation and management plan. 

## 2016-08-26 LAB — CMP14 + ANION GAP
ALK PHOS: 63 IU/L (ref 39–117)
ALT: 7 IU/L (ref 0–44)
ANION GAP: 18 mmol/L (ref 10.0–18.0)
AST: 9 IU/L (ref 0–40)
Albumin/Globulin Ratio: 1 — ABNORMAL LOW (ref 1.2–2.2)
Albumin: 3.4 g/dL — ABNORMAL LOW (ref 3.5–4.8)
BUN/Creatinine Ratio: 18 (ref 10–24)
BUN: 27 mg/dL (ref 8–27)
Bilirubin Total: 0.4 mg/dL (ref 0.0–1.2)
CALCIUM: 8.8 mg/dL (ref 8.6–10.2)
CO2: 27 mmol/L (ref 18–29)
CREATININE: 1.51 mg/dL — AB (ref 0.76–1.27)
Chloride: 93 mmol/L — ABNORMAL LOW (ref 96–106)
GFR calc Af Amer: 50 mL/min/{1.73_m2} — ABNORMAL LOW (ref >59)
GFR, EST NON AFRICAN AMERICAN: 43 mL/min/{1.73_m2} — AB (ref >59)
GLUCOSE: 107 mg/dL — AB (ref 65–99)
Globulin, Total: 3.3 g/dL (ref 1.5–4.5)
POTASSIUM: 4 mmol/L (ref 3.5–5.2)
Sodium: 138 mmol/L (ref 134–144)
Total Protein: 6.7 g/dL (ref 6.0–8.5)

## 2016-09-01 ENCOUNTER — Ambulatory Visit: Payer: Medicare HMO

## 2016-09-12 ENCOUNTER — Other Ambulatory Visit: Payer: Self-pay | Admitting: Internal Medicine

## 2016-09-15 ENCOUNTER — Telehealth: Payer: Self-pay

## 2016-09-15 NOTE — Telephone Encounter (Signed)
Per pharmacy alternative requested by insurance preference send new Rx for ventolin inhaler

## 2016-09-18 NOTE — Telephone Encounter (Signed)
Call made to pharmacy-proair changed to ventolin.Despina Hidden Cassady5/14/201810:57 AM

## 2016-09-19 ENCOUNTER — Telehealth: Payer: Self-pay

## 2016-09-19 ENCOUNTER — Ambulatory Visit (HOSPITAL_COMMUNITY)
Admission: RE | Admit: 2016-09-19 | Discharge: 2016-09-19 | Disposition: A | Discharge status: Home / Self Care | Payer: Medicare HMO | Source: Ambulatory Visit | Attending: Oncology | Admitting: Oncology

## 2016-09-19 DIAGNOSIS — I5189 Other ill-defined heart diseases: Secondary | ICD-10-CM | POA: Diagnosis not present

## 2016-09-19 DIAGNOSIS — R600 Localized edema: Secondary | ICD-10-CM | POA: Diagnosis not present

## 2016-09-19 NOTE — Telephone Encounter (Signed)
No answer, unable to LVM for appt reminder.

## 2016-09-19 NOTE — Progress Notes (Signed)
  Echocardiogram 2D Echocardiogram has been performed.  Nicholas [name] M 09/19/2016, 11:46 AM

## 2016-09-20 ENCOUNTER — Ambulatory Visit: Payer: Medicare HMO | Admitting: Dietician

## 2016-09-20 ENCOUNTER — Ambulatory Visit (INDEPENDENT_AMBULATORY_CARE_PROVIDER_SITE_OTHER): Payer: Medicare HMO | Admitting: Internal Medicine

## 2016-09-20 DIAGNOSIS — Z7189 Other specified counseling: Secondary | ICD-10-CM

## 2016-09-20 DIAGNOSIS — H548 Legal blindness, as defined in USA: Secondary | ICD-10-CM

## 2016-09-20 DIAGNOSIS — R600 Localized edema: Secondary | ICD-10-CM | POA: Diagnosis not present

## 2016-09-20 DIAGNOSIS — M150 Primary generalized (osteo)arthritis: Secondary | ICD-10-CM | POA: Diagnosis not present

## 2016-09-20 DIAGNOSIS — Z79891 Long term (current) use of opiate analgesic: Secondary | ICD-10-CM | POA: Diagnosis not present

## 2016-09-20 DIAGNOSIS — M1990 Unspecified osteoarthritis, unspecified site: Secondary | ICD-10-CM | POA: Diagnosis not present

## 2016-09-20 DIAGNOSIS — M159 Polyosteoarthritis, unspecified: Secondary | ICD-10-CM

## 2016-09-20 MED ORDER — HYDROCODONE-ACETAMINOPHEN 10-325 MG PO TABS
1.0000 | ORAL_TABLET | ORAL | 0 refills | Status: DC | PRN
Start: 2016-09-20 — End: 2016-09-20

## 2016-09-20 MED ORDER — HYDROCODONE-ACETAMINOPHEN 10-325 MG PO TABS
1.0000 | ORAL_TABLET | ORAL | 0 refills | Status: DC | PRN
Start: 2016-09-20 — End: 2016-11-03

## 2016-09-20 MED ORDER — FUROSEMIDE 20 MG PO TABS: 20.0000 mg | ORAL_TABLET | Freq: Every day | ORAL | 2 refills | Status: DC

## 2016-09-20 NOTE — Patient Instructions (Addendum)
Nicholas [name],  I would like to drop your lasix dose back to 20 mg daily and check your kidney function. I think your leg swelling is likely from some mild right sided heart failure that is causing the swelling in your legs. I think it would be best to have compression stocking to help keep the swelling out of your legs.  Please follow up with me in 1-2 months.    Heart Failure Heart failure means your heart has trouble pumping blood. This makes it hard for your body to work well. Heart failure is usually a long-term (chronic) condition. You must take good care of yourself and follow your doctor's treatment plan. Follow these instructions at home:  Take your heart medicine as told by your doctor.  Do not stop taking medicine unless your doctor tells you to.  Do not skip any dose of medicine.  Refill your medicines before they run out.  Take other medicines only as told by your doctor or pharmacist.  Stay active if told by your doctor. The elderly and people with severe heart failure should talk with a doctor about physical activity.  Eat heart-healthy foods. Choose foods that are without trans fat and are low in saturated fat, cholesterol, and salt (sodium). This includes fresh or frozen fruits and vegetables, fish, lean meats, fat-free or low-fat dairy foods, whole grains, and high-fiber foods. Lentils and dried peas and beans (legumes) are also good choices.  Limit salt if told by your doctor.  Cook in a healthy way. Roast, grill, broil, bake, poach, steam, or stir-fry foods.  Limit fluids as told by your doctor.  Weigh yourself every morning. Do this after you pee (urinate) and before you eat breakfast. Write down your weight to give to your doctor.  Take your blood pressure and write it down if your doctor tells you to.  Ask your doctor how to check your pulse. Check your pulse as told.  Lose weight if told by your doctor.  Stop smoking or chewing tobacco. Do not use gum or  patches that help you quit without your doctor's approval.  Schedule and go to doctor visits as told.  Nonpregnant women should have no more than 1 drink a day. Men should have no more than 2 drinks a day. Talk to your doctor about drinking alcohol.  Stop illegal drug use.  Stay current with shots (immunizations).  Manage your health conditions as told by your doctor.  Learn to manage your stress.  Rest when you are tired.  If it is really hot outside:  Avoid intense activities.  Use air conditioning or fans, or get in a cooler place.  Avoid caffeine and alcohol.  Wear loose-fitting, lightweight, and light-colored clothing.  If it is really cold outside:  Avoid intense activities.  Layer your clothing.  Wear mittens or gloves, a hat, and a scarf when going outside.  Avoid alcohol.  Learn about heart failure and get support as needed.  Get help to maintain or improve your quality of life and your ability to care for yourself as needed. Contact a doctor if:  You gain weight quickly.  You are more short of breath than usual.  You cannot do your normal activities.  You tire easily.  You cough more than normal, especially with activity.  You have any or more puffiness (swelling) in areas such as your hands, feet, ankles, or belly (abdomen).  You cannot sleep because it is hard to breathe.  You feel like  your heart is beating fast (palpitations).  You get dizzy or light-headed when you stand up. Get help right away if:  You have trouble breathing.  There is a change in mental status, such as becoming less alert or not being able to focus.  You have chest pain or discomfort.  You faint. This information is not intended to replace advice given to you by your health care provider. Make sure you discuss any questions you have with your health care provider. Document Released: 02/01/2008 Document Revised: 09/30/2015 Document Reviewed: 06/10/2012 Elsevier  Interactive Patient Education  2017 [employer].   How to Use Compression Stockings Compression stockings are elastic socks that squeeze the legs. They help to increase blood flow to the legs, decrease swelling in the legs, and reduce the chance of developing blood clots in the lower legs. Compression stockings are often used by people who:  Are recovering from surgery.  Have poor circulation in their legs.  Are prone to getting blood clots in their legs.  Have varicose veins.  Sit or stay in bed for long periods of time. How to use compression stockings Before you put on your compression stockings:  Make sure that they are the correct size. If you do not know your size, ask your health care provider.  Make sure that they are clean, dry, and in good condition.  Check them for rips and tears. Do not put them on if they are ripped or torn. Put your stockings on first thing in the morning, before you get out of bed. Keep them on for as long as your health care provider advises. When you are wearing your stockings:  Keep them as smooth as possible. Do not allow them to bunch up. It is especially important to prevent the stockings from bunching up around your toes or behind your knees.  Do not roll the stockings downward and leave them rolled down. This can decrease blood flow to your leg.  Change them right away if they become wet or dirty. 1.  When you take off your stockings, inspect your legs and feet. Anything that does not seem normal may require medical attention. Look for:  Open sores.  Red spots.  Swelling. Information and tips  Do not stop wearing your compression stockings without talking to your health care provider first.  Wash your stockings every day with mild detergent in cold or warm water. Do not use bleach. Air-dry your stockings or dry them in a clothes dryer on low heat.  Replace your stockings every 3-6 months.  If skin moisturizing is part of your  treatment plan, apply lotion or cream at night so that your skin will be dry when you put on the stockings in the morning. It is harder to put the stockings on when you have lotion on your legs or feet. Contact a health care provider if: Remove your stockings and seek medical care if:  You have a feeling of pins and needles in your feet or legs.  You have any new changes in your skin.  You have skin lesions that are getting worse.  You have swelling or pain that is getting worse. Get help right away if:  You have numbness or tingling in your lower legs that does not get better right after you take the stockings off.  Your toes or feet become cold and blue.  You develop open sores or red spots on your legs that do not go away.  You see or  feel a warm spot on your leg.  You have new swelling or soreness in your leg.  You are short of breath or you have chest pain for no reason.  You have a rapid or irregular heartbeat.  You feel light-headed or dizzy. This information is not intended to replace advice given to you by your health care provider. Make sure you discuss any questions you have with your health care provider. Document Released: 02/19/2009 Document Revised: 09/22/2015 Document Reviewed: 04/01/2014 Elsevier Interactive Patient Education  2017 [employer].

## 2016-09-20 NOTE — Progress Notes (Signed)
I spent 45 minutes in advance care planning today with the patient today as outlined below.  Who would be the best person to help the doctors make decisions? [Numbers updated in the EMR.] [facility]  What matters most to you? Working on cars, being outside (mowing the yard etc), cooking   What experiences have you had with serious illness or death? Currently diagnosed with lung cancer. His girlfriend was in and out of the hospital and died in the past year. He was the primary decision maker for her.   If you were very sick, what would be most important to you? "To live as long as possible even if means giving up what matters most to you  How much flexibility should you afford your decision-maker? Complete flexibility - make decisions on my behalf even if they may be different from my own wishes  Do you have anything at home to show others what we talked about to help them honor your wishes? Yes but do not have a copy with me today

## 2016-09-20 NOTE — Progress Notes (Signed)
Assisted Dr. Charlynn Grimes with advance care directives by facilitating prepare for your care program for Nicholas [name] and [name].  Spent approximately 75 minutes doing this activity. The patient's daughter was provided with 4 copies of their prepare for your care document and one for advance care directives. Masson Nalepa, Butch Penny, [place] 09/20/2016 2:54 PM.

## 2016-09-21 LAB — BMP8+ANION GAP
Anion Gap: 16 mmol/L (ref 10.0–18.0)
BUN / CREAT RATIO: 9 — AB (ref 10–24)
BUN: 9 mg/dL (ref 8–27)
CO2: 25 mmol/L (ref 18–29)
Calcium: 8.5 mg/dL — ABNORMAL LOW (ref 8.6–10.2)
Chloride: 100 mmol/L (ref 96–106)
Creatinine, Ser: 1.01 mg/dL (ref 0.76–1.27)
GFR calc Af Amer: 81 mL/min/{1.73_m2} (ref >59)
GFR calc non Af Amer: 70 mL/min/{1.73_m2} (ref >59)
Glucose: 93 mg/dL (ref 65–99)
Potassium: 4.3 mmol/L (ref 3.5–5.2)
SODIUM: 141 mmol/L (ref 134–144)

## 2016-09-22 ENCOUNTER — Other Ambulatory Visit: Payer: Self-pay | Admitting: Internal Medicine

## 2016-09-22 DIAGNOSIS — Z7189 Other specified counseling: Secondary | ICD-10-CM | POA: Insufficient documentation

## 2016-09-22 NOTE — Assessment & Plan Note (Signed)
Needs refills on his pain medications. Doing well on the current regimen of Hydrocodone 10-325 mg q4hr prn #180. Last UDS on 02/16/16 was appropriate. Review of the [place] database shows appropriate refills.   Plan Refill Rx today Checking UDS
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
## 2016-09-22 NOTE — Assessment & Plan Note (Signed)
Extensive discussion today with patient and daughter. Patient would like any and all life extending measures regardless of any potential adverse side effects.

## 2016-09-22 NOTE — Assessment & Plan Note (Signed)
Reports no improvement in his LE edema since increasing the lasix to 40 mg daily. Labs last time showed Albumin of 3.4, urine dipstick with no proteinuria. Had his ECHO done yesterday which showed EF 60-65%. LV with mild LVH. Grade 1 diastolic dysfunction with mildly increased PA pressure. Mild MR, RVE, and TR. He denies any shortness of breath, orthopnea or PND.   Plan: Compression stockings Decrease Lasix to 20 mg daily as he saw no benefit at higher dose and had elevated Cr at last visit Repeat BMET today >> unremarkable with Cr returned to baseline

## 2016-09-23 NOTE — Progress Notes (Signed)
Internal Medicine Clinic Attending  Case discussed with Dr. Boswell at the time of the visit.  We reviewed the resident's history and exam and pertinent patient test results.  I agree with the assessment, diagnosis, and plan of care documented in the resident's note.  

## 2016-10-04 ENCOUNTER — Encounter: Payer: Medicare HMO | Admitting: Internal Medicine

## 2016-10-04 LAB — TOXASSURE SELECT,+ANTIDEPR,UR

## 2016-10-06 DIAGNOSIS — M79661 Pain in right lower leg: Secondary | ICD-10-CM | POA: Diagnosis not present

## 2016-10-06 DIAGNOSIS — M7989 Other specified soft tissue disorders: Secondary | ICD-10-CM | POA: Diagnosis not present

## 2016-10-12 ENCOUNTER — Other Ambulatory Visit: Payer: Self-pay | Admitting: *Deleted

## 2016-10-12 DIAGNOSIS — R600 Localized edema: Secondary | ICD-10-CM

## 2016-11-03 ENCOUNTER — Other Ambulatory Visit: Payer: Self-pay | Admitting: Internal Medicine

## 2016-11-03 ENCOUNTER — Other Ambulatory Visit: Payer: Self-pay

## 2016-11-03 DIAGNOSIS — M159 Polyosteoarthritis, unspecified: Secondary | ICD-10-CM

## 2016-11-03 DIAGNOSIS — M150 Primary generalized (osteo)arthritis: Principal | ICD-10-CM

## 2016-11-03 MED ORDER — HYDROCODONE-ACETAMINOPHEN 10-325 MG PO TABS
1.0000 | ORAL_TABLET | ORAL | 0 refills | Status: DC | PRN
Start: 2016-11-03 — End: 2016-12-06

## 2016-11-03 MED ORDER — HYDROCODONE-ACETAMINOPHEN 10-325 MG PO TABS
1.0000 | ORAL_TABLET | ORAL | 0 refills | Status: DC | PRN
Start: 2016-11-03 — End: 2016-11-03

## 2016-11-03 NOTE — Telephone Encounter (Signed)
Last rx written 09/20/16. Last OV 09/20/16. F/U appt 12/06/16. UDS  09/20/16.

## 2016-11-03 NOTE — Telephone Encounter (Signed)
[place] narcotic database reviewed.  Dr. Charlynn Grimes will be on nights for 4 more weeks, so will give refill X 2.    UDS reviewed, at last visit also had ETOH metabolite present, will need repeat at next visit with Dr. Charlynn Grimes.   Also needs a new Pain contract  Please ensure he has an appointment with Dr. Charlynn Grimes or in [facility] sometime in August to discuss.   Thanks

## 2016-11-03 NOTE — Telephone Encounter (Signed)
HYDROcodone-acetaminophen (NORCO) 10-325 MG tablet, refill request. Want to know if he can have the Rx by today. Please call back.

## 2016-11-06 ENCOUNTER — Other Ambulatory Visit: Payer: Medicare HMO

## 2016-11-07 ENCOUNTER — Telehealth: Payer: Self-pay | Admitting: Internal Medicine

## 2016-11-07 ENCOUNTER — Ambulatory Visit (HOSPITAL_COMMUNITY): Payer: Medicare HMO

## 2016-11-07 ENCOUNTER — Other Ambulatory Visit: Payer: Medicare HMO

## 2016-11-07 NOTE — Telephone Encounter (Signed)
Pt called to r/s 7/5 MD appt as his CT Scan is not until 7/11. In basket message to scheduling.

## 2016-11-07 NOTE — Telephone Encounter (Signed)
Patient called to reschedule his appointment due to not having his CT scheduled until 7/11.  There is not appointment to F/U until August 7

## 2016-11-07 NOTE — Telephone Encounter (Signed)
lvm to inform pt of r/s 7/5 appt to 7/12 at noon per schmsg

## 2016-11-09 ENCOUNTER — Ambulatory Visit: Payer: Medicare HMO | Admitting: Internal Medicine

## 2016-11-11 ENCOUNTER — Other Ambulatory Visit: Payer: Self-pay | Admitting: Internal Medicine

## 2016-11-15 ENCOUNTER — Other Ambulatory Visit (HOSPITAL_BASED_OUTPATIENT_CLINIC_OR_DEPARTMENT_OTHER): Payer: Medicare HMO

## 2016-11-15 ENCOUNTER — Ambulatory Visit (HOSPITAL_COMMUNITY)
Admission: RE | Admit: 2016-11-15 | Discharge: 2016-11-15 | Disposition: A | Discharge status: Home / Self Care | Payer: Medicare HMO | Source: Ambulatory Visit | Attending: Internal Medicine | Admitting: Internal Medicine

## 2016-11-15 DIAGNOSIS — I700 Atherosclerosis of aorta: Secondary | ICD-10-CM | POA: Diagnosis not present

## 2016-11-15 DIAGNOSIS — C3411 Malignant neoplasm of upper lobe, right bronchus or lung: Secondary | ICD-10-CM | POA: Insufficient documentation

## 2016-11-15 DIAGNOSIS — R918 Other nonspecific abnormal finding of lung field: Secondary | ICD-10-CM | POA: Diagnosis not present

## 2016-11-15 DIAGNOSIS — R05 Cough: Secondary | ICD-10-CM | POA: Diagnosis not present

## 2016-11-15 DIAGNOSIS — I2510 Atherosclerotic heart disease of native coronary artery without angina pectoris: Secondary | ICD-10-CM | POA: Insufficient documentation

## 2016-11-15 DIAGNOSIS — I10 Essential (primary) hypertension: Secondary | ICD-10-CM

## 2016-11-15 LAB — COMPREHENSIVE METABOLIC PANEL
ALBUMIN: 2.7 g/dL — AB (ref 3.5–5.0)
ALK PHOS: 64 U/L (ref 40–150)
ALT: <6 U/L (ref 0–55)
ANION GAP: 11 meq/L (ref 3–11)
AST: 7 U/L (ref 5–34)
BILIRUBIN TOTAL: 0.26 mg/dL (ref 0.20–1.20)
BUN: 18.2 mg/dL (ref 7.0–26.0)
CALCIUM: 8.8 mg/dL (ref 8.4–10.4)
CO2: 27 mEq/L (ref 22–29)
Chloride: 104 mEq/L (ref 98–109)
Creatinine: 1.1 mg/dL (ref 0.7–1.3)
EGFR: 76 mL/min/{1.73_m2} — AB (ref >90)
Glucose: 106 mg/dl (ref 70–140)
Potassium: 3.6 mEq/L (ref 3.5–5.1)
Sodium: 142 mEq/L (ref 136–145)
TOTAL PROTEIN: 6.7 g/dL (ref 6.4–8.3)

## 2016-11-15 LAB — CBC WITH DIFFERENTIAL/PLATELET
BASO%: 0.6 % (ref 0.0–2.0)
Basophils Absolute: 0 10*3/uL (ref 0.0–0.1)
EOS ABS: 0.2 10*3/uL (ref 0.0–0.5)
EOS%: 2.2 % (ref 0.0–7.0)
HCT: 28.1 % — ABNORMAL LOW (ref 38.4–49.9)
HEMOGLOBIN: 8.5 g/dL — AB (ref 13.0–17.1)
LYMPH%: 14.6 % (ref 14.0–49.0)
MCH: 23.8 pg — ABNORMAL LOW (ref 27.2–33.4)
MCHC: 30.2 g/dL — ABNORMAL LOW (ref 32.0–36.0)
MCV: 78.7 fL — AB (ref 79.3–98.0)
MONO#: 0.7 10*3/uL (ref 0.1–0.9)
MONO%: 10.7 % (ref 0.0–14.0)
NEUT%: 71.9 % (ref 39.0–75.0)
NEUTROS ABS: 4.8 10*3/uL (ref 1.5–6.5)
PLATELETS: 293 10*3/uL (ref 140–400)
RBC: 3.57 10*6/uL — AB (ref 4.20–5.82)
RDW: 18.3 % — ABNORMAL HIGH (ref 11.0–14.6)
WBC: 6.7 10*3/uL (ref 4.0–10.3)
lymph#: 1 10*3/uL (ref 0.9–3.3)

## 2016-11-15 MED ORDER — IOPAMIDOL (ISOVUE-300) INJECTION 61%
75.0000 mL | Freq: Once | INTRAVENOUS | Status: AC | PRN
  Administered 2016-11-15: 75 mL via INTRAVENOUS

## 2016-11-15 MED ORDER — IOPAMIDOL (ISOVUE-300) INJECTION 61%
INTRAVENOUS | Status: AC
  Filled 2016-11-15: qty 75

## 2016-11-16 ENCOUNTER — Telehealth: Payer: Self-pay | Admitting: Internal Medicine

## 2016-11-16 ENCOUNTER — Ambulatory Visit (HOSPITAL_BASED_OUTPATIENT_CLINIC_OR_DEPARTMENT_OTHER): Payer: Medicare HMO | Admitting: Internal Medicine

## 2016-11-16 ENCOUNTER — Encounter: Payer: Self-pay | Admitting: Internal Medicine

## 2016-11-16 VITALS — BP 110/62 | HR 64 | Temp 99.1°F | Resp 17 | Ht 71.0 in | Wt 232.7 lb

## 2016-11-16 DIAGNOSIS — C3411 Malignant neoplasm of upper lobe, right bronchus or lung: Secondary | ICD-10-CM | POA: Diagnosis not present

## 2016-11-16 DIAGNOSIS — Z7189 Other specified counseling: Secondary | ICD-10-CM | POA: Insufficient documentation

## 2016-11-16 DIAGNOSIS — Z5111 Encounter for antineoplastic chemotherapy: Secondary | ICD-10-CM

## 2016-11-16 MED ORDER — FOLIC ACID 1 MG PO TABS: 1.0000 mg | ORAL_TABLET | Freq: Every day | ORAL | 4 refills | Status: AC

## 2016-11-16 MED ORDER — PROCHLORPERAZINE MALEATE 10 MG PO TABS
10.0000 mg | ORAL_TABLET | Freq: Four times a day (QID) | ORAL | 0 refills | Status: DC | PRN
Start: 2016-11-16 — End: 2017-02-04

## 2016-11-16 MED ORDER — DEXAMETHASONE 4 MG PO TABS: ORAL_TABLET | ORAL | 1 refills | Status: DC

## 2016-11-16 MED ORDER — CYANOCOBALAMIN 1000 MCG/ML IJ SOLN
1000.0000 ug | Freq: Once | INTRAMUSCULAR | Status: AC
  Administered 2016-11-16: 1000 ug via INTRAMUSCULAR
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
## 2016-11-16 NOTE — Progress Notes (Signed)
Received form from [facility] to reevaluate -Oxygen saturation testing. Pt has oxygen at home and uses it at home. He did not bring it today since he was sitting in wheelcair. I encouraged him to take our oxygen tank home and his family member declined and  said he has oxygen  at home and she will put it on him to walk him from car to house. O2 sat sitting at rest on room air-95% Lowest O2 sat level during exertion on room air-84% O2 sat level during exertion on oxygen 3 liters =97%

## 2016-11-16 NOTE — Telephone Encounter (Signed)
Scheduled appt per 7/12 los - Gave patient AVS and calender per los.

## 2016-11-16 NOTE — Progress Notes (Signed)
START ON PATHWAY REGIMEN - Non-Small Cell Lung     A cycle is every 21 days:     Carboplatin      Pemetrexed      Bevacizumab   **Always confirm dose/schedule in your pharmacy ordering system**    Patient Characteristics: Stage IV Metastatic, Non Squamous, Initial Chemotherapy/Immunotherapy, PS = 0, 1, PD-L1 Expression Positive 1-49% (TPS) / Negative / Not Tested / Not a Candidate for Immunotherapy/ Awaiting Test Results AJCC T Category: T2a Current Disease Status: Distant Metastases AJCC N Category: N2 AJCC M Category: M1a AJCC 8 Stage Grouping: IVA Histology: Non Squamous Cell ROS1 Rearrangement Status: Quantity Not Sufficient T790M Mutation Status: Not Applicable - EGFR Mutation Negative/Unknown Other Mutations/Biomarkers: No Other Actionable Mutations PD-L1 Expression Status: Quantity Not Sufficient Chemotherapy/Immunotherapy LOT: Initial Chemotherapy/Immunotherapy Molecular Targeted Therapy: Not Appropriate ALK Translocation Status: Quantity Not Sufficient Would you be surprised if this patient died  in the next year? I would NOT be surprised if this patient died in the next year EGFR Mutation Status: Quantity Not Sufficient BRAF V600E Mutation Status: Quantity Not Sufficient Performance Status: PS = 0, 1 Intent of Therapy: Non-Curative / Palliative Intent, Discussed with Patient

## 2016-11-17 ENCOUNTER — Other Ambulatory Visit (HOSPITAL_COMMUNITY)
Admission: RE | Admit: 2016-11-17 | Discharge: 2016-11-17 | Disposition: A | Discharge status: Home / Self Care | Payer: Medicare HMO | Source: Ambulatory Visit | Attending: Internal Medicine | Admitting: Internal Medicine

## 2016-11-17 ENCOUNTER — Other Ambulatory Visit: Payer: Self-pay | Admitting: *Deleted

## 2016-11-17 ENCOUNTER — Telehealth: Payer: Self-pay | Admitting: Medical Oncology

## 2016-11-17 DIAGNOSIS — C3490 Malignant neoplasm of unspecified part of unspecified bronchus or lung: Secondary | ICD-10-CM | POA: Diagnosis not present

## 2016-11-17 NOTE — Telephone Encounter (Signed)
Oncology Nurse Navigator Documentation  Oncology Nurse Navigator Flowsheets 11/17/2016  Navigator Location [facility]  Navigator Encounter Type Other/per Dr. Julien Nordmann, I requested PDL 1 testing from [facility] pathology.   Treatment Phase Abnormal Scans  Barriers/Navigation Needs Coordination of Care  Interventions Coordination of Care  Coordination of Care Other  Acuity Level 1  Time Spent with Patient 15

## 2016-11-17 NOTE — Telephone Encounter (Signed)
I returned call and left message for her to please call back with name of MD who ordered oxygen . If [facility], I can send required oxygen reeval documents.

## 2016-11-22 ENCOUNTER — Telehealth: Payer: Self-pay | Admitting: Medical Oncology

## 2016-11-22 NOTE — Telephone Encounter (Signed)
I left message that I faxed pt oxygen revaluation O2 sats.

## 2016-11-23 DIAGNOSIS — J449 Chronic obstructive pulmonary disease, unspecified: Secondary | ICD-10-CM | POA: Diagnosis not present

## 2016-11-28 ENCOUNTER — Encounter: Payer: Self-pay | Admitting: Vascular Surgery

## 2016-11-28 ENCOUNTER — Encounter: Payer: Self-pay | Admitting: Pharmacist

## 2016-11-29 ENCOUNTER — Encounter (HOSPITAL_COMMUNITY): Payer: Self-pay

## 2016-11-29 ENCOUNTER — Ambulatory Visit (HOSPITAL_BASED_OUTPATIENT_CLINIC_OR_DEPARTMENT_OTHER): Payer: Medicare HMO

## 2016-11-29 ENCOUNTER — Ambulatory Visit (HOSPITAL_BASED_OUTPATIENT_CLINIC_OR_DEPARTMENT_OTHER): Payer: Medicare HMO | Admitting: *Deleted

## 2016-11-29 VITALS — BP 141/66 | HR 66 | Temp 98.7°F | Resp 16

## 2016-11-29 DIAGNOSIS — C3411 Malignant neoplasm of upper lobe, right bronchus or lung: Secondary | ICD-10-CM | POA: Diagnosis not present

## 2016-11-29 DIAGNOSIS — Z5112 Encounter for antineoplastic immunotherapy: Secondary | ICD-10-CM | POA: Diagnosis not present

## 2016-11-29 LAB — CBC WITH DIFFERENTIAL/PLATELET
BASO%: 0 % (ref 0.0–2.0)
BASOS ABS: 0 10*3/uL (ref 0.0–0.1)
EOS ABS: 0 10*3/uL (ref 0.0–0.5)
EOS%: 0 % (ref 0.0–7.0)
HEMATOCRIT: 26.4 % — AB (ref 38.4–49.9)
HEMOGLOBIN: 8.1 g/dL — AB (ref 13.0–17.1)
LYMPH#: 0.4 10*3/uL — AB (ref 0.9–3.3)
LYMPH%: 19.2 % (ref 14.0–49.0)
MCH: 23.8 pg — AB (ref 27.2–33.4)
MCHC: 30.7 g/dL — ABNORMAL LOW (ref 32.0–36.0)
MCV: 77.6 fL — ABNORMAL LOW (ref 79.3–98.0)
MONO#: 0.1 10*3/uL (ref 0.1–0.9)
MONO%: 5.6 % (ref 0.0–14.0)
NEUT%: 75.2 % — ABNORMAL HIGH (ref 39.0–75.0)
NEUTROS ABS: 1.6 10*3/uL (ref 1.5–6.5)
Platelets: 281 10*3/uL (ref 140–400)
RBC: 3.4 10*6/uL — ABNORMAL LOW (ref 4.20–5.82)
RDW: 17.9 % — AB (ref 11.0–14.6)
WBC: 2.1 10*3/uL — AB (ref 4.0–10.3)

## 2016-11-29 LAB — COMPREHENSIVE METABOLIC PANEL
ALBUMIN: 2.6 g/dL — AB (ref 3.5–5.0)
ALK PHOS: 72 U/L (ref 40–150)
ALT: 8 U/L (ref 0–55)
AST: 7 U/L (ref 5–34)
Anion Gap: 10 mEq/L (ref 3–11)
BILIRUBIN TOTAL: 0.26 mg/dL (ref 0.20–1.20)
BUN: 19 mg/dL (ref 7.0–26.0)
CALCIUM: 9 mg/dL (ref 8.4–10.4)
CO2: 30 mEq/L — ABNORMAL HIGH (ref 22–29)
CREATININE: 1.2 mg/dL (ref 0.7–1.3)
Chloride: 102 mEq/L (ref 98–109)
EGFR: 66 mL/min/{1.73_m2} — ABNORMAL LOW (ref >90)
GLUCOSE: 140 mg/dL (ref 70–140)
POTASSIUM: 3.5 meq/L (ref 3.5–5.1)
SODIUM: 142 meq/L (ref 136–145)
TOTAL PROTEIN: 6.8 g/dL (ref 6.4–8.3)

## 2016-11-29 LAB — UA PROTEIN, DIPSTICK - CHCC: Protein, ur: 30 mg/dL

## 2016-11-29 MED ORDER — SODIUM CHLORIDE 0.9 % IV SOLN
480.0000 mg/m2 | Freq: Once | INTRAVENOUS | Status: AC
  Administered 2016-11-29: 1100 mg via INTRAVENOUS
  Filled 2016-11-29: qty 40

## 2016-11-29 MED ORDER — SODIUM CHLORIDE 0.9 % IV SOLN
Freq: Once | INTRAVENOUS | Status: AC
  Administered 2016-11-29: 14:00:00 via INTRAVENOUS
  Filled 2016-11-29: qty 5

## 2016-11-29 MED ORDER — SODIUM CHLORIDE 0.9 % IV SOLN
525.0000 mg | Freq: Once | INTRAVENOUS | Status: AC
  Administered 2016-11-29: 530 mg via INTRAVENOUS
  Filled 2016-11-29: qty 53

## 2016-11-29 MED ORDER — SODIUM CHLORIDE 0.9 % IV SOLN
15.2000 mg/kg | Freq: Once | INTRAVENOUS | Status: AC
  Administered 2016-11-29: 1600 mg via INTRAVENOUS
  Filled 2016-11-29: qty 64

## 2016-11-29 MED ORDER — SODIUM CHLORIDE 0.9 % IV SOLN
Freq: Once | INTRAVENOUS | Status: AC
  Administered 2016-11-29: 14:00:00 via INTRAVENOUS

## 2016-11-29 MED ORDER — PALONOSETRON HCL INJECTION 0.25 MG/5ML
0.2500 mg | Freq: Once | INTRAVENOUS | Status: AC
  Administered 2016-11-29: 0.25 mg via INTRAVENOUS

## 2016-11-29 NOTE — Patient Instructions (Signed)
[facility] Discharge Instructions for Patients Receiving Chemotherapy  Today you received the following chemotherapy agents carboplatin/alimta/avastin   To help prevent nausea and vomiting after your treatment, we encourage you to take your nausea medication as directed.  If you develop nausea and vomiting that is not controlled by your nausea medication, call the clinic.   BELOW ARE SYMPTOMS THAT SHOULD BE REPORTED IMMEDIATELY:  *FEVER GREATER THAN 100.5 F  *CHILLS WITH OR WITHOUT FEVER  NAUSEA AND VOMITING THAT IS NOT CONTROLLED WITH YOUR NAUSEA MEDICATION  *UNUSUAL SHORTNESS OF BREATH  *UNUSUAL BRUISING OR BLEEDING  TENDERNESS IN MOUTH AND THROAT WITH OR WITHOUT PRESENCE OF ULCERS  *URINARY PROBLEMS  *BOWEL PROBLEMS  UNUSUAL RASH Items with * indicate a potential emergency and should be followed up as soon as possible.  Feel free to call the clinic you have any questions or concerns. The clinic phone number is [phone].

## 2016-11-30 ENCOUNTER — Telehealth: Payer: Self-pay | Admitting: Medical Oncology

## 2016-11-30 NOTE — Telephone Encounter (Signed)
Line busy

## 2016-12-06 ENCOUNTER — Other Ambulatory Visit (HOSPITAL_BASED_OUTPATIENT_CLINIC_OR_DEPARTMENT_OTHER): Payer: Medicare HMO

## 2016-12-06 ENCOUNTER — Encounter: Payer: Self-pay | Admitting: Internal Medicine

## 2016-12-06 ENCOUNTER — Ambulatory Visit (INDEPENDENT_AMBULATORY_CARE_PROVIDER_SITE_OTHER): Payer: Medicare HMO | Admitting: Internal Medicine

## 2016-12-06 DIAGNOSIS — Z87891 Personal history of nicotine dependence: Secondary | ICD-10-CM

## 2016-12-06 DIAGNOSIS — Z79891 Long term (current) use of opiate analgesic: Secondary | ICD-10-CM | POA: Diagnosis not present

## 2016-12-06 DIAGNOSIS — C3411 Malignant neoplasm of upper lobe, right bronchus or lung: Secondary | ICD-10-CM

## 2016-12-06 DIAGNOSIS — M150 Primary generalized (osteo)arthritis: Secondary | ICD-10-CM

## 2016-12-06 DIAGNOSIS — I10 Essential (primary) hypertension: Secondary | ICD-10-CM | POA: Diagnosis not present

## 2016-12-06 DIAGNOSIS — M1990 Unspecified osteoarthritis, unspecified site: Secondary | ICD-10-CM

## 2016-12-06 DIAGNOSIS — Z79899 Other long term (current) drug therapy: Secondary | ICD-10-CM

## 2016-12-06 DIAGNOSIS — M159 Polyosteoarthritis, unspecified: Secondary | ICD-10-CM

## 2016-12-06 LAB — COMPREHENSIVE METABOLIC PANEL
ALT: 21 U/L (ref 0–55)
ANION GAP: 7 meq/L (ref 3–11)
AST: 23 U/L (ref 5–34)
Albumin: 2.7 g/dL — ABNORMAL LOW (ref 3.5–5.0)
Alkaline Phosphatase: 63 U/L (ref 40–150)
BUN: 17.7 mg/dL (ref 7.0–26.0)
CALCIUM: 8.2 mg/dL — AB (ref 8.4–10.4)
CHLORIDE: 103 meq/L (ref 98–109)
CO2: 31 meq/L — AB (ref 22–29)
Creatinine: 1.1 mg/dL (ref 0.7–1.3)
EGFR: 71 mL/min/{1.73_m2} — ABNORMAL LOW (ref >90)
Glucose: 105 mg/dl (ref 70–140)
POTASSIUM: 4 meq/L (ref 3.5–5.1)
Sodium: 141 mEq/L (ref 136–145)
Total Bilirubin: 0.29 mg/dL (ref 0.20–1.20)
Total Protein: 6 g/dL — ABNORMAL LOW (ref 6.4–8.3)

## 2016-12-06 LAB — CBC WITH DIFFERENTIAL/PLATELET
BASO%: 0.7 % (ref 0.0–2.0)
BASOS ABS: 0 10*3/uL (ref 0.0–0.1)
EOS%: 9.5 % — AB (ref 0.0–7.0)
Eosinophils Absolute: 0.3 10*3/uL (ref 0.0–0.5)
HEMATOCRIT: 27 % — AB (ref 38.4–49.9)
HGB: 8.1 g/dL — ABNORMAL LOW (ref 13.0–17.1)
LYMPH#: 1.5 10*3/uL (ref 0.9–3.3)
LYMPH%: 48.5 % (ref 14.0–49.0)
MCH: 23.6 pg — AB (ref 27.2–33.4)
MCHC: 30 g/dL — AB (ref 32.0–36.0)
MCV: 78.7 fL — ABNORMAL LOW (ref 79.3–98.0)
MONO#: 0.1 10*3/uL (ref 0.1–0.9)
MONO%: 1.6 % (ref 0.0–14.0)
NEUT#: 1.2 10*3/uL — ABNORMAL LOW (ref 1.5–6.5)
NEUT%: 39.7 % (ref 39.0–75.0)
Platelets: 178 10*3/uL (ref 140–400)
RBC: 3.43 10*6/uL — AB (ref 4.20–5.82)
RDW: 18 % — ABNORMAL HIGH (ref 11.0–14.6)
WBC: 3.1 10*3/uL — ABNORMAL LOW (ref 4.0–10.3)

## 2016-12-06 LAB — UA PROTEIN, DIPSTICK - CHCC

## 2016-12-06 MED ORDER — OXYCODONE-ACETAMINOPHEN 10-325 MG PO TABS: 1.0000 | ORAL_TABLET | Freq: Four times a day (QID) | ORAL | 0 refills | Status: DC | PRN
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
## 2016-12-06 NOTE — Patient Instructions (Signed)
Nicholas [name],  I am going to try you on a different pain medicine called percocet to see if that works any better for you. Please follow up with me in a month.

## 2016-12-13 ENCOUNTER — Other Ambulatory Visit (HOSPITAL_BASED_OUTPATIENT_CLINIC_OR_DEPARTMENT_OTHER): Payer: Medicare HMO

## 2016-12-13 DIAGNOSIS — C3411 Malignant neoplasm of upper lobe, right bronchus or lung: Secondary | ICD-10-CM

## 2016-12-13 LAB — COMPREHENSIVE METABOLIC PANEL
ALK PHOS: 77 U/L (ref 40–150)
ALT: 21 U/L (ref 0–55)
AST: 13 U/L (ref 5–34)
Albumin: 2.8 g/dL — ABNORMAL LOW (ref 3.5–5.0)
Anion Gap: 11 mEq/L (ref 3–11)
BILIRUBIN TOTAL: 0.27 mg/dL (ref 0.20–1.20)
BUN: 11.7 mg/dL (ref 7.0–26.0)
CALCIUM: 8.8 mg/dL (ref 8.4–10.4)
CO2: 28 mEq/L (ref 22–29)
Chloride: 101 mEq/L (ref 98–109)
Creatinine: 1 mg/dL (ref 0.7–1.3)
EGFR: 82 mL/min/{1.73_m2} — AB (ref >90)
GLUCOSE: 123 mg/dL (ref 70–140)
POTASSIUM: 3.2 meq/L — AB (ref 3.5–5.1)
Sodium: 140 mEq/L (ref 136–145)
TOTAL PROTEIN: 6.3 g/dL — AB (ref 6.4–8.3)

## 2016-12-13 LAB — CBC WITH DIFFERENTIAL/PLATELET
BASO%: 0.5 % (ref 0.0–2.0)
BASOS ABS: 0 10*3/uL (ref 0.0–0.1)
EOS ABS: 0.1 10*3/uL (ref 0.0–0.5)
EOS%: 6.8 % (ref 0.0–7.0)
HCT: 26.8 % — ABNORMAL LOW (ref 38.4–49.9)
HGB: 8.1 g/dL — ABNORMAL LOW (ref 13.0–17.1)
LYMPH%: 42.7 % (ref 14.0–49.0)
MCH: 24 pg — ABNORMAL LOW (ref 27.2–33.4)
MCHC: 30.2 g/dL — AB (ref 32.0–36.0)
MCV: 79.5 fL (ref 79.3–98.0)
MONO#: 0.4 10*3/uL (ref 0.1–0.9)
MONO%: 18 % — ABNORMAL HIGH (ref 0.0–14.0)
NEUT#: 0.7 10*3/uL — ABNORMAL LOW (ref 1.5–6.5)
NEUT%: 32 % — AB (ref 39.0–75.0)
PLATELETS: 70 10*3/uL — AB (ref 140–400)
RBC: 3.37 10*6/uL — AB (ref 4.20–5.82)
RDW: 18.2 % — ABNORMAL HIGH (ref 11.0–14.6)
WBC: 2.1 10*3/uL — ABNORMAL LOW (ref 4.0–10.3)
lymph#: 0.9 10*3/uL (ref 0.9–3.3)

## 2016-12-13 NOTE — Assessment & Plan Note (Addendum)
Patient reports that his pain is not well controlled. He reports needing 4 pills of the hydrocodone 10-325 mg pill in the morning to get his pain controlled. He says that this usually takes care of his pain for most of the day but usually needs another 2-3 pills at night. Last UDS in May appropriate with the exception of ETOH. Discussed drinking alcohol while on these medications.  database appropriate.  Plan: Given his lung cancer and resumption of chemotherapy, I think it is reasonable to continue to work on his pain management. Will change from hydrocodone to oxycodone 10-325 q6hr prn today. Will give #120. Follow up next month to re-assess.

## 2016-12-13 NOTE — Assessment & Plan Note (Signed)
Recently restarted on chemotherapy for his adenocarcinoma after progression noted on recent CT. Reports tolerating chemotherapy but reports it is 'rough.'  Follow up with Dr. Inda Merlin.

## 2016-12-13 NOTE — Assessment & Plan Note (Signed)
BP Readings from Last 3 Encounters:  12/06/16 (!) 160/90  11/29/16 (!) 141/66  11/16/16 110/62    Lab Results  Component Value Date   NA 140 12/13/2016   K 3.2 (L) 12/13/2016   CREATININE 1.0 12/13/2016   BP today is 160/90. He is currently on Coreg 25 mg bid, enalapril 40 mg daily and lasix 20 mg daily. He reports being in worse pain today which may be contributing to his elevated BP.   Assessment: Worsened BP  Plan: Continue current medications. Re-evaluate once pain is better controlled.

## 2016-12-13 NOTE — Progress Notes (Signed)
Internal Medicine Clinic Attending  Case discussed with Dr. Boswell at the time of the visit.  We reviewed the resident's history and exam and pertinent patient test results.  I agree with the assessment, diagnosis, and plan of care documented in the resident's note.  

## 2016-12-14 ENCOUNTER — Encounter: Payer: Self-pay | Admitting: Vascular Surgery

## 2016-12-14 ENCOUNTER — Ambulatory Visit (INDEPENDENT_AMBULATORY_CARE_PROVIDER_SITE_OTHER): Payer: Medicare HMO | Admitting: Vascular Surgery

## 2016-12-14 ENCOUNTER — Ambulatory Visit (HOSPITAL_COMMUNITY)
Admission: RE | Admit: 2016-12-14 | Discharge: 2016-12-14 | Disposition: A | Discharge status: Home / Self Care | Payer: Medicare HMO | Source: Ambulatory Visit | Attending: Vascular Surgery | Admitting: Vascular Surgery

## 2016-12-14 VITALS — BP 195/91 | HR 91 | Temp 99.4°F | Resp 20 | Ht 71.0 in | Wt 232.0 lb

## 2016-12-14 DIAGNOSIS — M7989 Other specified soft tissue disorders: Secondary | ICD-10-CM

## 2016-12-14 DIAGNOSIS — R600 Localized edema: Secondary | ICD-10-CM | POA: Diagnosis not present
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
## 2016-12-20 ENCOUNTER — Ambulatory Visit: Payer: Medicare HMO

## 2016-12-20 ENCOUNTER — Encounter: Payer: Self-pay | Admitting: Internal Medicine

## 2016-12-20 ENCOUNTER — Ambulatory Visit (HOSPITAL_BASED_OUTPATIENT_CLINIC_OR_DEPARTMENT_OTHER): Payer: Medicare HMO

## 2016-12-20 ENCOUNTER — Other Ambulatory Visit (HOSPITAL_BASED_OUTPATIENT_CLINIC_OR_DEPARTMENT_OTHER): Payer: Medicare HMO

## 2016-12-20 ENCOUNTER — Ambulatory Visit (HOSPITAL_COMMUNITY)
Admission: RE | Admit: 2016-12-20 | Discharge: 2016-12-20 | Disposition: A | Discharge status: Home / Self Care | Payer: Medicare HMO | Source: Ambulatory Visit | Attending: Internal Medicine | Admitting: Internal Medicine

## 2016-12-20 ENCOUNTER — Ambulatory Visit (HOSPITAL_BASED_OUTPATIENT_CLINIC_OR_DEPARTMENT_OTHER): Payer: Medicare HMO | Admitting: Internal Medicine

## 2016-12-20 ENCOUNTER — Other Ambulatory Visit: Payer: Self-pay | Admitting: Medical Oncology

## 2016-12-20 VITALS — BP 194/116 | HR 121 | Temp 98.1°F | Resp 18 | Ht 71.0 in | Wt 231.6 lb

## 2016-12-20 DIAGNOSIS — R530 Neoplastic (malignant) related fatigue: Secondary | ICD-10-CM | POA: Diagnosis not present

## 2016-12-20 DIAGNOSIS — D649 Anemia, unspecified: Secondary | ICD-10-CM

## 2016-12-20 DIAGNOSIS — D61810 Antineoplastic chemotherapy induced pancytopenia: Secondary | ICD-10-CM

## 2016-12-20 DIAGNOSIS — D6481 Anemia due to antineoplastic chemotherapy: Secondary | ICD-10-CM

## 2016-12-20 DIAGNOSIS — I10 Essential (primary) hypertension: Secondary | ICD-10-CM

## 2016-12-20 DIAGNOSIS — C3411 Malignant neoplasm of upper lobe, right bronchus or lung: Secondary | ICD-10-CM

## 2016-12-20 DIAGNOSIS — T451X5A Adverse effect of antineoplastic and immunosuppressive drugs, initial encounter: Secondary | ICD-10-CM

## 2016-12-20 DIAGNOSIS — M545 Low back pain: Secondary | ICD-10-CM | POA: Diagnosis not present

## 2016-12-20 DIAGNOSIS — Z5111 Encounter for antineoplastic chemotherapy: Secondary | ICD-10-CM

## 2016-12-20 LAB — COMPREHENSIVE METABOLIC PANEL
ALT: 13 U/L (ref 0–55)
AST: 12 U/L (ref 5–34)
Albumin: 2.9 g/dL — ABNORMAL LOW (ref 3.5–5.0)
Alkaline Phosphatase: 86 U/L (ref 40–150)
Anion Gap: 9 mEq/L (ref 3–11)
BUN: 14.8 mg/dL (ref 7.0–26.0)
CHLORIDE: 100 meq/L (ref 98–109)
CO2: 29 meq/L (ref 22–29)
CREATININE: 1.2 mg/dL (ref 0.7–1.3)
Calcium: 9.1 mg/dL (ref 8.4–10.4)
EGFR: 67 mL/min/{1.73_m2} — ABNORMAL LOW (ref >90)
GLUCOSE: 141 mg/dL — AB (ref 70–140)
Potassium: 3.8 mEq/L (ref 3.5–5.1)
SODIUM: 138 meq/L (ref 136–145)
TOTAL PROTEIN: 6.6 g/dL (ref 6.4–8.3)
Total Bilirubin: 0.24 mg/dL (ref 0.20–1.20)

## 2016-12-20 LAB — CBC WITH DIFFERENTIAL/PLATELET
BASO%: 0 % (ref 0.0–2.0)
Basophils Absolute: 0 10*3/uL (ref 0.0–0.1)
EOS%: 0 % (ref 0.0–7.0)
Eosinophils Absolute: 0 10*3/uL (ref 0.0–0.5)
HCT: 26.4 % — ABNORMAL LOW (ref 38.4–49.9)
HGB: 8 g/dL — ABNORMAL LOW (ref 13.0–17.1)
LYMPH%: 28.2 % (ref 14.0–49.0)
MCH: 24.1 pg — ABNORMAL LOW (ref 27.2–33.4)
MCHC: 30.3 g/dL — AB (ref 32.0–36.0)
MCV: 79.5 fL (ref 79.3–98.0)
MONO#: 0.7 10*3/uL (ref 0.1–0.9)
MONO%: 29.9 % — AB (ref 0.0–14.0)
NEUT%: 41.9 % (ref 39.0–75.0)
NEUTROS ABS: 1 10*3/uL — AB (ref 1.5–6.5)
NRBC: 0 % (ref 0–0)
Platelets: 329 10*3/uL (ref 140–400)
RBC: 3.32 10*6/uL — AB (ref 4.20–5.82)
RDW: 19.9 % — AB (ref 11.0–14.6)
WBC: 2.3 10*3/uL — AB (ref 4.0–10.3)
lymph#: 0.7 10*3/uL — ABNORMAL LOW (ref 0.9–3.3)

## 2016-12-20 LAB — PREPARE RBC (CROSSMATCH)

## 2016-12-20 LAB — ABO/RH: ABO/RH(D): O POS

## 2016-12-20 MED ORDER — ACETAMINOPHEN 325 MG PO TABS
650.0000 mg | ORAL_TABLET | Freq: Once | ORAL | Status: AC
  Administered 2016-12-20: 650 mg via ORAL

## 2016-12-20 MED ORDER — DIPHENHYDRAMINE HCL 25 MG PO CAPS
25.0000 mg | ORAL_CAPSULE | Freq: Once | ORAL | Status: AC
  Administered 2016-12-20: 25 mg via ORAL

## 2016-12-20 MED ORDER — SODIUM CHLORIDE 0.9 % IV SOLN
250.0000 mL | Freq: Once | INTRAVENOUS | Status: AC
  Administered 2016-12-20: 250 mL via INTRAVENOUS

## 2016-12-20 MED ORDER — DIPHENHYDRAMINE HCL 25 MG PO CAPS
ORAL_CAPSULE | ORAL | Status: AC
Start: 2016-12-20 — End: ?
  Filled 2016-12-20: qty 1

## 2016-12-20 MED ORDER — ACETAMINOPHEN 325 MG PO TABS
ORAL_TABLET | ORAL | Status: AC
  Filled 2016-12-20: qty 2

## 2016-12-20 MED ORDER — CLONIDINE HCL 0.1 MG PO TABS
0.2000 mg | ORAL_TABLET | Freq: Once | ORAL | Status: DC
  Administered 2016-12-20: 0.2 mg via ORAL

## 2016-12-20 NOTE — Patient Instructions (Signed)
Blood Transfusion, Adult A blood transfusion is a procedure in which you receive donated blood, including plasma, platelets, and red blood cells, through an IV tube. You may need a blood transfusion because of illness, surgery, or injury. The blood may come from a donor. You may also be able to donate blood for yourself (autologous blood donation) before a surgery if you know that you might require a blood transfusion. The blood given in a transfusion is made up of different types of cells. You may receive:  Red blood cells. These carry oxygen to the cells in the body.  White blood cells. These help you fight infections.  Platelets. These help your blood to clot.  Plasma. This is the liquid part of your blood and it helps with fluid imbalances.  If you have hemophilia or another clotting disorder, you may also receive other types of blood products. Tell a health care provider about:  Any allergies you have.  All medicines you are taking, including vitamins, herbs, eye drops, creams, and over-the-counter medicines.  Any problems you or family members have had with anesthetic medicines.  Any blood disorders you have.  Any surgeries you have had.  Any medical conditions you have, including any recent fever or cold symptoms.  Whether you are pregnant or may be pregnant.  Any previous reactions you have had during a blood transfusion. What are the risks? Generally, this is a safe procedure. However, problems may occur, including:  Having an allergic reaction to something in the donated blood. Hives and itching may be symptoms of this type of reaction.  Fever. This may be a reaction to the white blood cells in the transfused blood. Nausea or chest pain may accompany a fever.  Iron overload. This can happen from having many transfusions.  Transfusion-related acute lung injury (TRALI). This is a rare reaction that causes lung damage. The cause is not known.TRALI can occur within hours  of a transfusion or several days later.  Sudden (acute) or delayed hemolytic reactions. This happens if your blood does not match the cells in your transfusion. Your body's defense system (immune system) may try to attack the new cells. This complication is rare. The symptoms include fever, chills, nausea, and low back pain or chest pain.  Infection or disease transmission. This is rare.  What happens before the procedure?  You will have a blood test to determine your blood type. This is necessary to know what kind of blood your body will accept and to match it to the donor blood.  If you are going to have a planned surgery, you may be able to do an autologous blood donation. This may be done in case you need to have a transfusion.  If you have had an allergic reaction to a transfusion in the past, you may be given medicine to help prevent a reaction. This medicine may be given to you by mouth or through an IV tube.  You will have your temperature, blood pressure, and pulse monitored before the transfusion.  Follow instructions from your health care provider about eating and drinking restrictions.  Ask your health care provider about: ? Changing or stopping your regular medicines. This is especially important if you are taking diabetes medicines or blood thinners. ? Taking medicines such as aspirin and ibuprofen. These medicines can thin your blood. Do not take these medicines before your procedure if your health care provider instructs you not to. What happens during the procedure?  An IV tube will   be inserted into one of your veins.  The bag of donated blood will be attached to your IV tube. The blood will then enter through your vein.  Your temperature, blood pressure, and pulse will be monitored regularly during the transfusion. This monitoring is done to detect early signs of a transfusion reaction.  If you have any signs or symptoms of a reaction, your transfusion will be stopped  and you may be given medicine.  When the transfusion is complete, your IV tube will be removed.  Pressure may be applied to the IV site for a few minutes.  A bandage (dressing) will be applied. The procedure may vary among health care providers and hospitals. What happens after the procedure?  Your temperature, blood pressure, heart rate, breathing rate, and blood oxygen level will be monitored often.  Your blood may be tested to see how you are responding to the transfusion.  You may be warmed with fluids or blankets to maintain a normal body temperature. Summary  A blood transfusion is a procedure in which you receive donated blood, including plasma, platelets, and red blood cells, through an IV tube.  Your temperature, blood pressure, and pulse will be monitored before, during, and after the transfusion.  Your blood may be tested after the transfusion to see how your body has responded. This information is not intended to replace advice given to you by your health care provider. Make sure you discuss any questions you have with your health care provider. Document Released: 04/21/2000 Document Revised: 01/20/2016 Document Reviewed: 01/20/2016 Elsevier Interactive Patient Education  2018 [employer].  Hypertension Hypertension is another name for high blood pressure. High blood pressure forces your heart to work harder to pump blood. This can cause problems over time. There are two numbers in a blood pressure reading. There is a top number (systolic) over a bottom number (diastolic). It is best to have a blood pressure below 120/80. Healthy choices can help lower your blood pressure. You may need medicine to help lower your blood pressure if:  Your blood pressure cannot be lowered with healthy choices.  Your blood pressure is higher than 130/80.  Follow these instructions at home: Eating and drinking  If directed, follow the DASH eating plan. This diet includes: ? Filling  half of your plate at each meal with fruits and vegetables. ? Filling one quarter of your plate at each meal with whole grains. Whole grains include whole wheat pasta, brown rice, and whole grain bread. ? Eating or drinking low-fat dairy products, such as skim milk or low-fat yogurt. ? Filling one quarter of your plate at each meal with low-fat (lean) proteins. Low-fat proteins include fish, skinless chicken, eggs, beans, and tofu. ? Avoiding fatty meat, cured and processed meat, or chicken with skin. ? Avoiding premade or processed food.  Eat less than 1,500 mg of salt (sodium) a day.  Limit alcohol use to no more than 1 drink a day for nonpregnant women and 2 drinks a day for men. One drink equals 12 oz of beer, 5 oz of wine, or 1 oz of hard liquor. Lifestyle  Work with your doctor to stay at a healthy weight or to lose weight. Ask your doctor what the best weight is for you.  Get at least 30 minutes of exercise that causes your heart to beat faster (aerobic exercise) most days of the week. This may include walking, swimming, or biking.  Get at least 30 minutes of exercise that strengthens your  muscles (resistance exercise) at least 3 days a week. This may include lifting weights or pilates.  Do not use any products that contain nicotine or tobacco. This includes cigarettes and e-cigarettes. If you need help quitting, ask your doctor.  Check your blood pressure at home as told by your doctor.  Keep all follow-up visits as told by your doctor. This is important. Medicines  Take over-the-counter and prescription medicines only as told by your doctor. Follow directions carefully.  Do not skip doses of blood pressure medicine. The medicine does not work as well if you skip doses. Skipping doses also puts you at risk for problems.  Ask your doctor about side effects or reactions to medicines that you should watch for. Contact a doctor if:  You think you are having a reaction to the  medicine you are taking.  You have headaches that keep coming back (recurring).  You feel dizzy.  You have swelling in your ankles.  You have trouble with your vision. Get help right away if:  You get a very bad headache.  You start to feel confused.  You feel weak or numb.  You feel faint.  You get very bad pain in your: ? Chest. ? Belly (abdomen).  You throw up (vomit) more than once.  You have trouble breathing. Summary  Hypertension is another name for high blood pressure.  Making healthy choices can help lower blood pressure. If your blood pressure cannot be controlled with healthy choices, you may need to take medicine. This information is not intended to replace advice given to you by your health care provider. Make sure you discuss any questions you have with your health care provider. Document Released: 10/11/2007 Document Revised: 03/22/2016 Document Reviewed: 03/22/2016 Elsevier Interactive Patient Education  [employer].

## 2016-12-20 NOTE — Progress Notes (Signed)
Notified Dr Julien Nordmann of elevated BP & he reports if BP remains above 757 systolic will need to go to ED.  Pt is urinating well & has no c/o's.  Will cont to observe.  Pt d/c with daughter & recommended that he go to ED with systolic BP @ 322.  He states he can't go to ED & won't b/c he will be there all night. Informed of possibility of a stroke with hypertension.  He states he will take his BP med when he gets home.  Discussed need to make sure pt take BP meds with daughter & if he is taking & BP remains high to talk with his PCP.
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
## 2016-12-21 LAB — BPAM RBC
Blood Product Expiration Date: 201809132359
Blood Product Expiration Date: 201809132359
ISSUE DATE / TIME: 201808151332
ISSUE DATE / TIME: 201808151630
UNIT TYPE AND RH: 5100
Unit Type and Rh: 5100

## 2016-12-21 LAB — TYPE AND SCREEN
ABO/RH(D): O POS
ANTIBODY SCREEN: NEGATIVE
UNIT DIVISION: 0
UNIT DIVISION: 0

## 2016-12-22 ENCOUNTER — Telehealth: Payer: Self-pay | Admitting: Internal Medicine

## 2016-12-22 NOTE — Telephone Encounter (Signed)
R/s appt per 8/15 los - patient is aware of new appts date and time.

## 2016-12-24 DIAGNOSIS — J449 Chronic obstructive pulmonary disease, unspecified: Secondary | ICD-10-CM | POA: Diagnosis not present

## 2016-12-26 ENCOUNTER — Ambulatory Visit (INDEPENDENT_AMBULATORY_CARE_PROVIDER_SITE_OTHER): Payer: Medicare HMO | Admitting: Internal Medicine

## 2016-12-26 ENCOUNTER — Encounter: Payer: Self-pay | Admitting: Internal Medicine

## 2016-12-26 VITALS — BP 150/61 | HR 59 | Temp 98.0°F | Ht 71.0 in | Wt 232.9 lb

## 2016-12-26 DIAGNOSIS — C3411 Malignant neoplasm of upper lobe, right bronchus or lung: Secondary | ICD-10-CM

## 2016-12-26 DIAGNOSIS — G629 Polyneuropathy, unspecified: Secondary | ICD-10-CM | POA: Diagnosis not present

## 2016-12-26 DIAGNOSIS — M792 Neuralgia and neuritis, unspecified: Secondary | ICD-10-CM

## 2016-12-26 NOTE — Progress Notes (Signed)
Internal Medicine Clinic Attending  I saw and evaluated the patient.  I personally confirmed the key portions of the history and exam documented by Dr. Helberg and I reviewed pertinent patient test results.  The assessment, diagnosis, and plan were formulated together and I agree with the documentation in the resident's note. 

## 2016-12-26 NOTE — Patient Instructions (Signed)
It was a pleasure to meet you today.   - We have put in for additional imaging of your stomach. We will call you for your appointment. If you have not heard from [place] by Friday please call the office.   - You can take 1 and a half pills of the oxycodone in the AM and PM for your pain.   - Please follow-up with Dr. Charlynn Grimes if your pain fails to improve.
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
## 2016-12-26 NOTE — Assessment & Plan Note (Addendum)
3 month history of stabbing right flank pain that waxes and wanes. Setting of active cancer. Recently changed pain medications from Hydrocodone 10-325 mg q4hrs to Oxycodone 10-325 mg q6hrs with minimal relief. Denies fevers, chills, constipation, trauma, changes in weight. There is no dermatomal rash on physical exam. Sounds like neuropathic pain but is not interested in increasing Gabapentin due to sedative effect, requesting increase in Oxy.  Plan: - Increase Oxycodone 10-325 mg from 1 tab BID to 1.5 tabs BID - CT thoracic and lumbar spine to rule out metastatic spread of adenocarcinoma or compression fracture.  - Follow-up in 1 month if pain persists

## 2016-12-27 ENCOUNTER — Other Ambulatory Visit: Payer: Medicare HMO

## 2016-12-28 ENCOUNTER — Ambulatory Visit (HOSPITAL_BASED_OUTPATIENT_CLINIC_OR_DEPARTMENT_OTHER): Payer: Medicare HMO | Admitting: Oncology

## 2016-12-28 ENCOUNTER — Other Ambulatory Visit (HOSPITAL_BASED_OUTPATIENT_CLINIC_OR_DEPARTMENT_OTHER): Payer: Medicare HMO

## 2016-12-28 ENCOUNTER — Encounter: Payer: Self-pay | Admitting: Oncology

## 2016-12-28 ENCOUNTER — Ambulatory Visit (HOSPITAL_BASED_OUTPATIENT_CLINIC_OR_DEPARTMENT_OTHER): Payer: Medicare HMO

## 2016-12-28 VITALS — BP 151/69 | HR 53

## 2016-12-28 VITALS — BP 197/85 | HR 62 | Temp 98.1°F | Resp 17 | Ht 71.0 in | Wt 239.4 lb

## 2016-12-28 DIAGNOSIS — Z5112 Encounter for antineoplastic immunotherapy: Secondary | ICD-10-CM

## 2016-12-28 DIAGNOSIS — Z5111 Encounter for antineoplastic chemotherapy: Secondary | ICD-10-CM | POA: Diagnosis not present

## 2016-12-28 DIAGNOSIS — G620 Drug-induced polyneuropathy: Secondary | ICD-10-CM | POA: Diagnosis not present

## 2016-12-28 DIAGNOSIS — C3411 Malignant neoplasm of upper lobe, right bronchus or lung: Secondary | ICD-10-CM

## 2016-12-28 DIAGNOSIS — I10 Essential (primary) hypertension: Secondary | ICD-10-CM

## 2016-12-28 LAB — COMPREHENSIVE METABOLIC PANEL WITH GFR
ALT: 12 U/L (ref 0–55)
AST: 15 U/L (ref 5–34)
Albumin: 2.8 g/dL — ABNORMAL LOW (ref 3.5–5.0)
Alkaline Phosphatase: 73 U/L (ref 40–150)
Anion Gap: 9 meq/L (ref 3–11)
BUN: 11.9 mg/dL (ref 7.0–26.0)
CO2: 31 meq/L — ABNORMAL HIGH (ref 22–29)
Calcium: 8.6 mg/dL (ref 8.4–10.4)
Chloride: 99 meq/L (ref 98–109)
Creatinine: 1.1 mg/dL (ref 0.7–1.3)
EGFR: 75 mL/min/{1.73_m2} — ABNORMAL LOW
Glucose: 141 mg/dL — ABNORMAL HIGH (ref 70–140)
Potassium: 3.5 meq/L (ref 3.5–5.1)
Sodium: 139 meq/L (ref 136–145)
Total Bilirubin: <0.22 mg/dL (ref 0.20–1.20)
Total Protein: 6.2 g/dL — ABNORMAL LOW (ref 6.4–8.3)

## 2016-12-28 LAB — UA PROTEIN, DIPSTICK - CHCC: Protein, ur: 30 mg/dL

## 2016-12-28 LAB — CBC WITH DIFFERENTIAL/PLATELET
BASO%: 0.1 % (ref 0.0–2.0)
Basophils Absolute: 0 10*3/uL (ref 0.0–0.1)
EOS%: 0 % (ref 0.0–7.0)
Eosinophils Absolute: 0 10*3/uL (ref 0.0–0.5)
HCT: 32.3 % — ABNORMAL LOW (ref 38.4–49.9)
HGB: 10.2 g/dL — ABNORMAL LOW (ref 13.0–17.1)
LYMPH%: 10.4 % — ABNORMAL LOW (ref 14.0–49.0)
MCH: 25.5 pg — ABNORMAL LOW (ref 27.2–33.4)
MCHC: 31.6 g/dL — ABNORMAL LOW (ref 32.0–36.0)
MCV: 80.7 fL (ref 79.3–98.0)
MONO#: 1 10*3/uL — ABNORMAL HIGH (ref 0.1–0.9)
MONO%: 10.8 % (ref 0.0–14.0)
NEUT#: 7.4 10*3/uL — ABNORMAL HIGH (ref 1.5–6.5)
NEUT%: 78.7 % — ABNORMAL HIGH (ref 39.0–75.0)
Platelets: 259 10*3/uL (ref 140–400)
RBC: 4 10*6/uL — ABNORMAL LOW (ref 4.20–5.82)
RDW: 22.4 % — ABNORMAL HIGH (ref 11.0–14.6)
WBC: 9.4 10*3/uL (ref 4.0–10.3)
lymph#: 1 10*3/uL (ref 0.9–3.3)

## 2016-12-28 MED ORDER — CLONIDINE HCL 0.1 MG PO TABS
ORAL_TABLET | ORAL | Status: AC
  Filled 2016-12-28: qty 1

## 2016-12-28 MED ORDER — CARBOPLATIN CHEMO INTRADERMAL TEST DOSE 100MCG/0.02ML
100.0000 ug | Freq: Once | INTRADERMAL | Status: AC
  Administered 2016-12-28: 100 ug via INTRADERMAL
  Filled 2016-12-28: qty 0.02

## 2016-12-28 MED ORDER — PALONOSETRON HCL INJECTION 0.25 MG/5ML
INTRAVENOUS | Status: AC
  Filled 2016-12-28: qty 5

## 2016-12-28 MED ORDER — PALONOSETRON HCL INJECTION 0.25 MG/5ML
0.2500 mg | Freq: Once | INTRAVENOUS | Status: AC
  Administered 2016-12-28: 0.25 mg via INTRAVENOUS

## 2016-12-28 MED ORDER — SODIUM CHLORIDE 0.9 % IV SOLN
Freq: Once | INTRAVENOUS | Status: AC
  Administered 2016-12-28: 11:00:00 via INTRAVENOUS
  Filled 2016-12-28: qty 5

## 2016-12-28 MED ORDER — CLONIDINE HCL 0.1 MG PO TABS
0.2000 mg | ORAL_TABLET | Freq: Once | ORAL | Status: AC
  Administered 2016-12-28: 0.2 mg via ORAL

## 2016-12-28 MED ORDER — SODIUM CHLORIDE 0.9 % IV SOLN
480.0000 mg/m2 | Freq: Once | INTRAVENOUS | Status: AC
  Administered 2016-12-28: 1100 mg via INTRAVENOUS
  Filled 2016-12-28: qty 40

## 2016-12-28 MED ORDER — SODIUM CHLORIDE 0.9 % IV SOLN
420.0000 mg | Freq: Once | INTRAVENOUS | Status: AC
  Administered 2016-12-28: 420 mg via INTRAVENOUS
  Filled 2016-12-28: qty 42

## 2016-12-28 MED ORDER — SODIUM CHLORIDE 0.9 % IV SOLN
Freq: Once | INTRAVENOUS | Status: AC
  Administered 2016-12-28: 11:00:00 via INTRAVENOUS

## 2016-12-28 MED ORDER — BEVACIZUMAB CHEMO INJECTION 400 MG/16ML
1600.0000 mg | Freq: Once | INTRAVENOUS | Status: AC
  Administered 2016-12-28: 1600 mg via INTRAVENOUS
  Filled 2016-12-28: qty 64

## 2016-12-28 NOTE — Assessment & Plan Note (Signed)
For hypertension, I strongly encouraged the patient to take his blood pressure medication as prescribed. I will give him a dose of clonidine 0.2 mg by mouth 1 today. Follow up with PCP for ongoing management.

## 2016-12-28 NOTE — Patient Instructions (Signed)
[facility] Discharge Instructions for Patients Receiving Chemotherapy  Today you received the following chemotherapy agents:  Avastin, Alimta, and Carboplatin.  To help prevent nausea and vomiting after your treatment, we encourage you to take your nausea medication as directed.   If you develop nausea and vomiting that is not controlled by your nausea medication, call the clinic.   BELOW ARE SYMPTOMS THAT SHOULD BE REPORTED IMMEDIATELY:  *FEVER GREATER THAN 100.5 F  *CHILLS WITH OR WITHOUT FEVER  NAUSEA AND VOMITING THAT IS NOT CONTROLLED WITH YOUR NAUSEA MEDICATION  *UNUSUAL SHORTNESS OF BREATH  *UNUSUAL BRUISING OR BLEEDING  TENDERNESS IN MOUTH AND THROAT WITH OR WITHOUT PRESENCE OF ULCERS  *URINARY PROBLEMS  *BOWEL PROBLEMS  UNUSUAL RASH Items with * indicate a potential emergency and should be followed up as soon as possible.  Feel free to call the clinic you have any questions or concerns. The clinic phone number is [phone].  Please show the [facility] at check-in to the Emergency Department and triage nurse.

## 2016-12-28 NOTE — Assessment & Plan Note (Signed)
This is a very pleasant 82 year old African-American male with metastatic non-small cell lung cancer initially diagnosed as a stage IIIa adenocarcinoma status post a course of concurrent chemoradiation followed by consolidation chemotherapy. PDL 1 expression was 20% The patient is currently undergoing systemic chemotherapy with carboplatin, Alimta and Avastin status post 1 cycle. He tolerated the first cycle of his treatment fairly well except for pancytopenia. Chemotherapy was held last week. Labs today have been reviewed and are adequate for treatment.  Recommend that he proceed with cycle 2 today, but the dose of carboplatin will be reduced to AUC of 4 starting beginning with this cycle as previously outlined by Dr. Julien Nordmann.  Continue weekly labs. Follow up visit in 3 weeks for evaluation prior to cycle 3.
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
## 2016-12-29 ENCOUNTER — Ambulatory Visit: Payer: Medicare HMO | Admitting: Oncology

## 2016-12-29 ENCOUNTER — Ambulatory Visit: Payer: Medicare HMO

## 2016-12-29 ENCOUNTER — Other Ambulatory Visit: Payer: Medicare HMO

## 2017-01-03 ENCOUNTER — Other Ambulatory Visit (HOSPITAL_BASED_OUTPATIENT_CLINIC_OR_DEPARTMENT_OTHER): Payer: Medicare HMO

## 2017-01-03 DIAGNOSIS — C3411 Malignant neoplasm of upper lobe, right bronchus or lung: Secondary | ICD-10-CM

## 2017-01-03 LAB — CBC WITH DIFFERENTIAL/PLATELET
BASO%: 0.3 % (ref 0.0–2.0)
Basophils Absolute: 0 10*3/uL (ref 0.0–0.1)
EOS ABS: 0.1 10*3/uL (ref 0.0–0.5)
EOS%: 0.7 % (ref 0.0–7.0)
HEMATOCRIT: 30.9 % — AB (ref 38.4–49.9)
HEMOGLOBIN: 9.9 g/dL — AB (ref 13.0–17.1)
LYMPH#: 1.2 10*3/uL (ref 0.9–3.3)
LYMPH%: 16.2 % (ref 14.0–49.0)
MCH: 25.8 pg — ABNORMAL LOW (ref 27.2–33.4)
MCHC: 32.2 g/dL (ref 32.0–36.0)
MCV: 80.2 fL (ref 79.3–98.0)
MONO#: 0.1 10*3/uL (ref 0.1–0.9)
MONO%: 1.3 % (ref 0.0–14.0)
NEUT%: 81.5 % — ABNORMAL HIGH (ref 39.0–75.0)
NEUTROS ABS: 6.2 10*3/uL (ref 1.5–6.5)
Platelets: 152 10*3/uL (ref 140–400)
RBC: 3.85 10*6/uL — ABNORMAL LOW (ref 4.20–5.82)
RDW: 21.8 % — AB (ref 11.0–14.6)
WBC: 7.6 10*3/uL (ref 4.0–10.3)

## 2017-01-03 LAB — COMPREHENSIVE METABOLIC PANEL
ALBUMIN: 2.6 g/dL — AB (ref 3.5–5.0)
ALK PHOS: 54 U/L (ref 40–150)
ALT: 13 U/L (ref 0–55)
AST: 17 U/L (ref 5–34)
Anion Gap: 8 mEq/L (ref 3–11)
BILIRUBIN TOTAL: 0.59 mg/dL (ref 0.20–1.20)
BUN: 20.6 mg/dL (ref 7.0–26.0)
CALCIUM: 8.1 mg/dL — AB (ref 8.4–10.4)
CO2: 30 mEq/L — ABNORMAL HIGH (ref 22–29)
Chloride: 103 mEq/L (ref 98–109)
Creatinine: 1.2 mg/dL (ref 0.7–1.3)
EGFR: 66 mL/min/{1.73_m2} — AB (ref >90)
GLUCOSE: 127 mg/dL (ref 70–140)
Potassium: 2.8 mEq/L — CL (ref 3.5–5.1)
Sodium: 142 mEq/L (ref 136–145)
TOTAL PROTEIN: 5.4 g/dL — AB (ref 6.4–8.3)

## 2017-01-06 ENCOUNTER — Other Ambulatory Visit: Payer: Self-pay | Admitting: Internal Medicine

## 2017-01-10 ENCOUNTER — Ambulatory Visit: Payer: Medicare HMO

## 2017-01-10 ENCOUNTER — Ambulatory Visit: Payer: Medicare HMO | Admitting: Internal Medicine

## 2017-01-10 ENCOUNTER — Other Ambulatory Visit (HOSPITAL_BASED_OUTPATIENT_CLINIC_OR_DEPARTMENT_OTHER): Payer: Medicare HMO

## 2017-01-10 ENCOUNTER — Telehealth: Payer: Self-pay | Admitting: Medical Oncology

## 2017-01-10 ENCOUNTER — Other Ambulatory Visit: Payer: Self-pay | Admitting: Medical Oncology

## 2017-01-10 DIAGNOSIS — E876 Hypokalemia: Secondary | ICD-10-CM

## 2017-01-10 DIAGNOSIS — C3411 Malignant neoplasm of upper lobe, right bronchus or lung: Secondary | ICD-10-CM

## 2017-01-10 LAB — COMPREHENSIVE METABOLIC PANEL
ALBUMIN: 2.9 g/dL — AB (ref 3.5–5.0)
ALT: 17 U/L (ref 0–55)
AST: 18 U/L (ref 5–34)
Alkaline Phosphatase: 70 U/L (ref 40–150)
Anion Gap: 8 mEq/L (ref 3–11)
BUN: 18.2 mg/dL (ref 7.0–26.0)
CHLORIDE: 103 meq/L (ref 98–109)
CO2: 33 meq/L — AB (ref 22–29)
Calcium: 8 mg/dL — ABNORMAL LOW (ref 8.4–10.4)
Creatinine: 1.2 mg/dL (ref 0.7–1.3)
EGFR: 66 mL/min/{1.73_m2} — AB (ref >90)
GLUCOSE: 122 mg/dL (ref 70–140)
POTASSIUM: 3 meq/L — AB (ref 3.5–5.1)
SODIUM: 144 meq/L (ref 136–145)
Total Bilirubin: 0.35 mg/dL (ref 0.20–1.20)
Total Protein: 5.8 g/dL — ABNORMAL LOW (ref 6.4–8.3)

## 2017-01-10 LAB — CBC WITH DIFFERENTIAL/PLATELET
BASO%: 0.5 % (ref 0.0–2.0)
BASOS ABS: 0 10*3/uL (ref 0.0–0.1)
EOS ABS: 0.2 10*3/uL (ref 0.0–0.5)
EOS%: 5.4 % (ref 0.0–7.0)
HCT: 30 % — ABNORMAL LOW (ref 38.4–49.9)
HEMOGLOBIN: 9.5 g/dL — AB (ref 13.0–17.1)
LYMPH%: 20.1 % (ref 14.0–49.0)
MCH: 25.9 pg — AB (ref 27.2–33.4)
MCHC: 31.5 g/dL — AB (ref 32.0–36.0)
MCV: 82.1 fL (ref 79.3–98.0)
MONO#: 1.1 10*3/uL — ABNORMAL HIGH (ref 0.1–0.9)
MONO%: 25.8 % — AB (ref 0.0–14.0)
NEUT#: 2 10*3/uL (ref 1.5–6.5)
NEUT%: 48.2 % (ref 39.0–75.0)
Platelets: 79 10*3/uL — ABNORMAL LOW (ref 140–400)
RBC: 3.66 10*6/uL — AB (ref 4.20–5.82)
RDW: 22 % — AB (ref 11.0–14.6)
WBC: 4.2 10*3/uL (ref 4.0–10.3)
lymph#: 0.8 10*3/uL — ABNORMAL LOW (ref 0.9–3.3)

## 2017-01-10 MED ORDER — POTASSIUM CHLORIDE CRYS ER 20 MEQ PO TBCR: 20.0000 meq | EXTENDED_RELEASE_TABLET | Freq: Two times a day (BID) | ORAL | 0 refills | Status: AC

## 2017-01-10 NOTE — Plan of Care (Signed)
Pt.notified

## 2017-01-10 NOTE — Telephone Encounter (Signed)
err

## 2017-01-16 ENCOUNTER — Telehealth: Payer: Self-pay | Admitting: Medical Oncology
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
## 2017-01-17 ENCOUNTER — Other Ambulatory Visit: Payer: Medicare HMO

## 2017-01-18 ENCOUNTER — Ambulatory Visit: Payer: Medicare HMO | Admitting: Internal Medicine

## 2017-01-18 ENCOUNTER — Ambulatory Visit: Payer: Medicare HMO

## 2017-01-18 ENCOUNTER — Other Ambulatory Visit: Payer: Self-pay | Admitting: *Deleted

## 2017-01-18 ENCOUNTER — Telehealth: Payer: Self-pay | Admitting: Oncology

## 2017-01-18 ENCOUNTER — Ambulatory Visit (HOSPITAL_BASED_OUTPATIENT_CLINIC_OR_DEPARTMENT_OTHER): Payer: Medicare HMO | Admitting: Oncology

## 2017-01-18 ENCOUNTER — Other Ambulatory Visit: Payer: Medicare HMO

## 2017-01-18 ENCOUNTER — Other Ambulatory Visit (HOSPITAL_BASED_OUTPATIENT_CLINIC_OR_DEPARTMENT_OTHER): Payer: Medicare HMO

## 2017-01-18 VITALS — BP 192/89 | HR 70 | Temp 98.1°F | Resp 18 | Ht 71.0 in | Wt 225.0 lb

## 2017-01-18 DIAGNOSIS — C3411 Malignant neoplasm of upper lobe, right bronchus or lung: Secondary | ICD-10-CM

## 2017-01-18 DIAGNOSIS — I10 Essential (primary) hypertension: Secondary | ICD-10-CM

## 2017-01-18 DIAGNOSIS — Z5111 Encounter for antineoplastic chemotherapy: Secondary | ICD-10-CM

## 2017-01-18 LAB — COMPREHENSIVE METABOLIC PANEL
ALK PHOS: 82 U/L (ref 40–150)
ALT: 22 U/L (ref 0–55)
ANION GAP: 11 meq/L (ref 3–11)
AST: 16 U/L (ref 5–34)
Albumin: 2.9 g/dL — ABNORMAL LOW (ref 3.5–5.0)
BUN: 22.9 mg/dL (ref 7.0–26.0)
CALCIUM: 8.9 mg/dL (ref 8.4–10.4)
CO2: 28 mEq/L (ref 22–29)
Chloride: 104 mEq/L (ref 98–109)
Creatinine: 1.4 mg/dL — ABNORMAL HIGH (ref 0.7–1.3)
EGFR: 54 mL/min/{1.73_m2} — AB (ref >90)
Glucose: 205 mg/dl — ABNORMAL HIGH (ref 70–140)
POTASSIUM: 3.8 meq/L (ref 3.5–5.1)
Sodium: 143 mEq/L (ref 136–145)
Total Bilirubin: 0.26 mg/dL (ref 0.20–1.20)
Total Protein: 6.5 g/dL (ref 6.4–8.3)

## 2017-01-18 LAB — CBC WITH DIFFERENTIAL/PLATELET
BASO%: 0.1 % (ref 0.0–2.0)
BASOS ABS: 0 10*3/uL (ref 0.0–0.1)
EOS%: 0 % (ref 0.0–7.0)
Eosinophils Absolute: 0 10*3/uL (ref 0.0–0.5)
HEMATOCRIT: 32.2 % — AB (ref 38.4–49.9)
HEMOGLOBIN: 10.2 g/dL — AB (ref 13.0–17.1)
LYMPH#: 0.5 10*3/uL — AB (ref 0.9–3.3)
LYMPH%: 10.6 % — ABNORMAL LOW (ref 14.0–49.0)
MCH: 26.4 pg — AB (ref 27.2–33.4)
MCHC: 31.7 g/dL — ABNORMAL LOW (ref 32.0–36.0)
MCV: 83.2 fL (ref 79.3–98.0)
MONO#: 0.4 10*3/uL (ref 0.1–0.9)
MONO%: 8.6 % (ref 0.0–14.0)
NEUT#: 4 10*3/uL (ref 1.5–6.5)
NEUT%: 80.7 % — ABNORMAL HIGH (ref 39.0–75.0)
Platelets: 265 10*3/uL (ref 140–400)
RBC: 3.87 10*6/uL — ABNORMAL LOW (ref 4.20–5.82)
RDW: 23.5 % — AB (ref 11.0–14.6)
WBC: 5 10*3/uL (ref 4.0–10.3)

## 2017-01-18 LAB — UA PROTEIN, DIPSTICK - CHCC: PROTEIN: 30 mg/dL

## 2017-01-18 MED ORDER — CLONIDINE HCL 0.1 MG PO TABS
ORAL_TABLET | ORAL | Status: AC
  Filled 2017-01-18: qty 2

## 2017-01-18 MED ORDER — CLONIDINE HCL 0.1 MG PO TABS
0.2000 mg | ORAL_TABLET | Freq: Once | ORAL | Status: AC
  Administered 2017-01-18: 0.2 mg via ORAL

## 2017-01-18 MED ORDER — CLONIDINE HCL 0.1 MG PO TABS: 0.2000 mg | ORAL_TABLET | Freq: Once | ORAL | Status: DC

## 2017-01-18 MED ORDER — CARBOPLATIN CHEMO INTRADERMAL TEST DOSE 100MCG/0.02ML
100.0000 ug | Freq: Once | INTRADERMAL | Status: DC
  Filled 2017-01-18: qty 0.02

## 2017-01-18 NOTE — Assessment & Plan Note (Signed)
This is a very pleasant 82 year old African-American male with metastatic non-small cell lung cancer initially diagnosed as a stage IIIa adenocarcinoma status post a course of concurrent chemoradiation followed by consolidation chemotherapy. PDL 1 expression was 20% The patient is currently undergoing systemic chemotherapy with carboplatin, Alimta and Avastin status post 2 cycles. He developed pancytopenia following the first cycle requiring a delay in his treatment. Carboplatin dose was reduced to an AUC of 4 beginning with cycle 2.  The patient was seen with Dr. Julien Nordmann. Recommend that he patient proceed with cycle 3 today. The patient will have a restaging CT scan of the chest prior to his next visit.  Continue weekly labs. Follow up visit in 3 weeks for evaluation prior to cycle 4.

## 2017-01-18 NOTE — Telephone Encounter (Signed)
Patient filled previous Rx for Hydrocodone 10-325 mg #180 on 01/05/17 written 09/20/16 prior to switch to oxycodone. Oxycodone 10-325 #120 filled on 12/06/16. As it is not clear what the patient is taking currently I do not believe a filled is appropriate without being seen first. Please have patient scheduled to be evaluated in clinic to clarify his doing and medication needs.

## 2017-01-18 NOTE — Telephone Encounter (Signed)
Scheduled appt per 9/13 los - Gave patient AVS and calender per los.  
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
## 2017-01-18 NOTE — Assessment & Plan Note (Signed)
For hypertension, I strongly encouraged the patient to take his blood pressure medication as prescribed. I will give him a dose of clonidine 0.2 mg by mouth 1 today. Follow up with PCP for ongoing management.

## 2017-01-18 NOTE — Progress Notes (Signed)
IV attempt without success x 6 total by 3 RN's. Mikey Bussing, AGNP notified. Armanda Magic aware. Will schedule PAC placement and adjust chemo and CT schedule. Patient and daughter aware and agree with plan of care.

## 2017-01-19 ENCOUNTER — Telehealth: Payer: Self-pay | Admitting: Internal Medicine

## 2017-01-19 NOTE — Telephone Encounter (Signed)
sw pt daughter to inform of r/s infusion appt to 9/28 per sch msg.

## 2017-01-22 ENCOUNTER — Ambulatory Visit: Payer: Medicare HMO

## 2017-01-22 ENCOUNTER — Other Ambulatory Visit: Payer: Self-pay | Admitting: Internal Medicine

## 2017-01-22 ENCOUNTER — Encounter: Payer: Self-pay | Admitting: Oncology

## 2017-01-24 ENCOUNTER — Other Ambulatory Visit: Payer: Medicare HMO

## 2017-01-24 DIAGNOSIS — J449 Chronic obstructive pulmonary disease, unspecified: Secondary | ICD-10-CM | POA: Diagnosis not present

## 2017-01-25 ENCOUNTER — Other Ambulatory Visit (HOSPITAL_BASED_OUTPATIENT_CLINIC_OR_DEPARTMENT_OTHER): Payer: Medicare HMO

## 2017-01-25 ENCOUNTER — Ambulatory Visit (HOSPITAL_COMMUNITY): Admission: RE | Admit: 2017-01-25 | Payer: Medicare HMO | Source: Ambulatory Visit

## 2017-01-25 DIAGNOSIS — C3411 Malignant neoplasm of upper lobe, right bronchus or lung: Secondary | ICD-10-CM

## 2017-01-25 LAB — COMPREHENSIVE METABOLIC PANEL
ALT: 27 U/L (ref 0–55)
ANION GAP: 11 meq/L (ref 3–11)
AST: 26 U/L (ref 5–34)
Albumin: 2.9 g/dL — ABNORMAL LOW (ref 3.5–5.0)
Alkaline Phosphatase: 91 U/L (ref 40–150)
BUN: 14.7 mg/dL (ref 7.0–26.0)
CALCIUM: 9.1 mg/dL (ref 8.4–10.4)
CHLORIDE: 104 meq/L (ref 98–109)
CO2: 26 mEq/L (ref 22–29)
Creatinine: 1.2 mg/dL (ref 0.7–1.3)
EGFR: 63 mL/min/{1.73_m2} — AB (ref >90)
Glucose: 194 mg/dl — ABNORMAL HIGH (ref 70–140)
Potassium: 3.5 mEq/L (ref 3.5–5.1)
Sodium: 142 mEq/L (ref 136–145)
Total Bilirubin: 0.32 mg/dL (ref 0.20–1.20)
Total Protein: 6.5 g/dL (ref 6.4–8.3)

## 2017-01-25 LAB — CBC WITH DIFFERENTIAL/PLATELET
BASO%: 0.2 % (ref 0.0–2.0)
Basophils Absolute: 0 10*3/uL (ref 0.0–0.1)
EOS%: 0.2 % (ref 0.0–7.0)
Eosinophils Absolute: 0 10*3/uL (ref 0.0–0.5)
HEMATOCRIT: 31.4 % — AB (ref 38.4–49.9)
HGB: 9.9 g/dL — ABNORMAL LOW (ref 13.0–17.1)
LYMPH#: 0.8 10*3/uL — AB (ref 0.9–3.3)
LYMPH%: 6.5 % — ABNORMAL LOW (ref 14.0–49.0)
MCH: 26.3 pg — AB (ref 27.2–33.4)
MCHC: 31.5 g/dL — AB (ref 32.0–36.0)
MCV: 83.5 fL (ref 79.3–98.0)
MONO#: 1.3 10*3/uL — ABNORMAL HIGH (ref 0.1–0.9)
MONO%: 10.4 % (ref 0.0–14.0)
NEUT#: 10.3 10*3/uL — ABNORMAL HIGH (ref 1.5–6.5)
NEUT%: 82.7 % — AB (ref 39.0–75.0)
Platelets: 190 10*3/uL (ref 140–400)
RBC: 3.76 10*6/uL — ABNORMAL LOW (ref 4.20–5.82)
RDW: 24.4 % — ABNORMAL HIGH (ref 11.0–14.6)
WBC: 12.5 10*3/uL — ABNORMAL HIGH (ref 4.0–10.3)

## 2017-01-26 ENCOUNTER — Ambulatory Visit (INDEPENDENT_AMBULATORY_CARE_PROVIDER_SITE_OTHER): Payer: Medicare HMO | Admitting: Internal Medicine

## 2017-01-26 ENCOUNTER — Encounter: Payer: Self-pay | Admitting: Internal Medicine

## 2017-01-26 VITALS — BP 207/120 | HR 106 | Temp 98.3°F | Ht 71.0 in | Wt 228.0 lb

## 2017-01-26 DIAGNOSIS — Z23 Encounter for immunization: Secondary | ICD-10-CM | POA: Diagnosis not present

## 2017-01-26 DIAGNOSIS — R000 Tachycardia, unspecified: Secondary | ICD-10-CM

## 2017-01-26 DIAGNOSIS — G8929 Other chronic pain: Secondary | ICD-10-CM

## 2017-01-26 DIAGNOSIS — Z79899 Other long term (current) drug therapy: Secondary | ICD-10-CM | POA: Diagnosis not present

## 2017-01-26 DIAGNOSIS — Z79891 Long term (current) use of opiate analgesic: Secondary | ICD-10-CM | POA: Diagnosis not present

## 2017-01-26 DIAGNOSIS — M150 Primary generalized (osteo)arthritis: Secondary | ICD-10-CM

## 2017-01-26 DIAGNOSIS — I10 Essential (primary) hypertension: Secondary | ICD-10-CM

## 2017-01-26 DIAGNOSIS — M47816 Spondylosis without myelopathy or radiculopathy, lumbar region: Secondary | ICD-10-CM | POA: Diagnosis not present

## 2017-01-26 DIAGNOSIS — M159 Polyosteoarthritis, unspecified: Secondary | ICD-10-CM

## 2017-01-26 DIAGNOSIS — M8949 Other hypertrophic osteoarthropathy, multiple sites: Secondary | ICD-10-CM

## 2017-01-26 DIAGNOSIS — Z87891 Personal history of nicotine dependence: Secondary | ICD-10-CM | POA: Diagnosis not present

## 2017-01-26 MED ORDER — ENALAPRIL MALEATE 20 MG PO TABS: 40.0000 mg | ORAL_TABLET | Freq: Every day | ORAL | 2 refills | Status: DC

## 2017-01-26 MED ORDER — OXYCODONE-ACETAMINOPHEN 10-325 MG PO TABS: 1.0000 | ORAL_TABLET | Freq: Three times a day (TID) | ORAL | 0 refills | Status: DC | PRN

## 2017-01-26 MED ORDER — OXYCODONE-ACETAMINOPHEN 10-325 MG PO TABS: 1.0000 | ORAL_TABLET | Freq: Four times a day (QID) | ORAL | 0 refills | Status: DC | PRN

## 2017-01-26 MED ORDER — CARVEDILOL 25 MG PO TABS: 25.0000 mg | ORAL_TABLET | Freq: Two times a day (BID) | ORAL | 2 refills | Status: DC

## 2017-01-26 NOTE — Assessment & Plan Note (Addendum)
Pt reports not taking his carvedilol, enalapril or lasix today and admits that he takes them very intermittently.  He does not check his blood pressure at home.  A friend has a blood pressure cuff that he uses "every now and then" he reports it is usually high when he checks it at least 170/90.  Today in the office his blood pressure is 239/136.  On repeat it came down to 207/120.  He is not symptomatic today, he denies any headache chest pain or shortness of breath as seen above in review of systems.  Emphasized the importance of taking his blood pressure medicines, asked that he go to the pharmacy immediately following this visit pickup the medications and take his blood pressure medications for the day.  He is also agreeable to coming back  around 1 week for a blood pressure check after taking his medications as prescribed for 1 week.  -refilled bp medicines listed above and scheduled pt for bp check in one week.

## 2017-01-26 NOTE — Patient Instructions (Addendum)
Please take your blood pressure medications as prescribed. We have refilled your prescriptions.  Your blood pressure was very high today.  It is important to take your bp meds as prescribed for the next week and come back for bp check.

## 2017-01-26 NOTE — Assessment & Plan Note (Addendum)
Patient reports taking  3 Percocet per day.  He feels his pain control is better but he still has a moderate amount of pain.  However, he is willing to stick with this dose for now.  We agreed to prescribe 2 months worth at the 10mg -325mg  dosage.  He does not describe any new symptoms to suggest a new injury or metastasis.  He has good urine output and no new numbness in his legs.  He mentions that he has an appointment next month to receive a port for his chemotherapy that will be followed by a CT scan of his thorax and lumbar spine.  I asked him about the imaging that was ordered or him to receive yesterday, and he said he would rather defer to his oncologist's imaging.  -continue with Percocet regimen as written above.
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
## 2017-01-29 NOTE — Progress Notes (Signed)
Internal Medicine Clinic Attending  I saw and evaluated the patient.  I personally confirmed the key portions of the history and exam documented by Dr. Winfrey and I reviewed pertinent patient test results.  The assessment, diagnosis, and plan were formulated together and I agree with the documentation in the resident's note. 

## 2017-01-30 ENCOUNTER — Encounter (HOSPITAL_COMMUNITY): Payer: Self-pay | Admitting: Emergency Medicine

## 2017-01-30 ENCOUNTER — Emergency Department (HOSPITAL_COMMUNITY)
Admission: EM | Admit: 2017-01-30 | Discharge: 2017-01-30 | Disposition: A | Discharge status: Home / Self Care | Payer: Medicare HMO | Source: Home / Self Care | Attending: Emergency Medicine | Admitting: Emergency Medicine

## 2017-01-30 ENCOUNTER — Other Ambulatory Visit: Payer: Self-pay

## 2017-01-30 ENCOUNTER — Other Ambulatory Visit: Payer: Self-pay | Admitting: Radiology

## 2017-01-30 DIAGNOSIS — C3490 Malignant neoplasm of unspecified part of unspecified bronchus or lung: Secondary | ICD-10-CM | POA: Insufficient documentation

## 2017-01-30 DIAGNOSIS — R2232 Localized swelling, mass and lump, left upper limb: Secondary | ICD-10-CM | POA: Diagnosis not present

## 2017-01-30 DIAGNOSIS — N179 Acute kidney failure, unspecified: Secondary | ICD-10-CM | POA: Diagnosis not present

## 2017-01-30 DIAGNOSIS — L02414 Cutaneous abscess of left upper limb: Secondary | ICD-10-CM | POA: Diagnosis not present

## 2017-01-30 DIAGNOSIS — Z9221 Personal history of antineoplastic chemotherapy: Secondary | ICD-10-CM | POA: Diagnosis not present

## 2017-01-30 DIAGNOSIS — L03314 Cellulitis of groin: Secondary | ICD-10-CM

## 2017-01-30 DIAGNOSIS — I130 Hypertensive heart and chronic kidney disease with heart failure and stage 1 through stage 4 chronic kidney disease, or unspecified chronic kidney disease: Secondary | ICD-10-CM | POA: Diagnosis not present

## 2017-01-30 DIAGNOSIS — B372 Candidiasis of skin and nail: Secondary | ICD-10-CM

## 2017-01-30 DIAGNOSIS — L03114 Cellulitis of left upper limb: Secondary | ICD-10-CM | POA: Diagnosis not present

## 2017-01-30 DIAGNOSIS — N492 Inflammatory disorders of scrotum: Secondary | ICD-10-CM | POA: Diagnosis not present

## 2017-01-30 DIAGNOSIS — D649 Anemia, unspecified: Secondary | ICD-10-CM | POA: Diagnosis present

## 2017-01-30 DIAGNOSIS — C771 Secondary and unspecified malignant neoplasm of intrathoracic lymph nodes: Secondary | ICD-10-CM | POA: Diagnosis not present

## 2017-01-30 DIAGNOSIS — N5082 Scrotal pain: Secondary | ICD-10-CM | POA: Diagnosis not present

## 2017-01-30 DIAGNOSIS — F329 Major depressive disorder, single episode, unspecified: Secondary | ICD-10-CM | POA: Diagnosis present

## 2017-01-30 DIAGNOSIS — Z79899 Other long term (current) drug therapy: Secondary | ICD-10-CM | POA: Insufficient documentation

## 2017-01-30 DIAGNOSIS — J449 Chronic obstructive pulmonary disease, unspecified: Secondary | ICD-10-CM

## 2017-01-30 DIAGNOSIS — C3411 Malignant neoplasm of upper lobe, right bronchus or lung: Secondary | ICD-10-CM | POA: Diagnosis not present

## 2017-01-30 DIAGNOSIS — Z888 Allergy status to other drugs, medicaments and biological substances status: Secondary | ICD-10-CM | POA: Diagnosis not present

## 2017-01-30 DIAGNOSIS — Z87891 Personal history of nicotine dependence: Secondary | ICD-10-CM | POA: Diagnosis not present

## 2017-01-30 DIAGNOSIS — I5030 Unspecified diastolic (congestive) heart failure: Secondary | ICD-10-CM | POA: Diagnosis not present

## 2017-01-30 DIAGNOSIS — L03119 Cellulitis of unspecified part of limb: Secondary | ICD-10-CM | POA: Diagnosis not present

## 2017-01-30 DIAGNOSIS — B3749 Other urogenital candidiasis: Secondary | ICD-10-CM | POA: Diagnosis not present

## 2017-01-30 DIAGNOSIS — E785 Hyperlipidemia, unspecified: Secondary | ICD-10-CM | POA: Diagnosis present

## 2017-01-30 DIAGNOSIS — I129 Hypertensive chronic kidney disease with stage 1 through stage 4 chronic kidney disease, or unspecified chronic kidney disease: Secondary | ICD-10-CM | POA: Diagnosis not present

## 2017-01-30 DIAGNOSIS — M109 Gout, unspecified: Secondary | ICD-10-CM | POA: Diagnosis present

## 2017-01-30 DIAGNOSIS — M542 Cervicalgia: Secondary | ICD-10-CM | POA: Diagnosis not present

## 2017-01-30 DIAGNOSIS — N289 Disorder of kidney and ureter, unspecified: Secondary | ICD-10-CM | POA: Insufficient documentation

## 2017-01-30 DIAGNOSIS — N183 Chronic kidney disease, stage 3 (moderate): Secondary | ICD-10-CM | POA: Diagnosis not present

## 2017-01-30 DIAGNOSIS — N182 Chronic kidney disease, stage 2 (mild): Secondary | ICD-10-CM | POA: Diagnosis not present

## 2017-01-30 DIAGNOSIS — I10 Essential (primary) hypertension: Secondary | ICD-10-CM

## 2017-01-30 DIAGNOSIS — I5032 Chronic diastolic (congestive) heart failure: Secondary | ICD-10-CM

## 2017-01-30 DIAGNOSIS — K219 Gastro-esophageal reflux disease without esophagitis: Secondary | ICD-10-CM | POA: Diagnosis present

## 2017-01-30 DIAGNOSIS — E876 Hypokalemia: Secondary | ICD-10-CM | POA: Diagnosis not present

## 2017-01-30 DIAGNOSIS — R109 Unspecified abdominal pain: Secondary | ICD-10-CM | POA: Diagnosis not present

## 2017-01-30 LAB — I-STAT TROPONIN, ED: TROPONIN I, POC: 0.02 ng/mL (ref 0.00–0.08)

## 2017-01-30 LAB — CBC WITH DIFFERENTIAL/PLATELET
Basophils Absolute: 0 10*3/uL (ref 0.0–0.1)
Basophils Relative: 0 %
EOS ABS: 0 10*3/uL (ref 0.0–0.7)
Eosinophils Relative: 0 %
HCT: 37.8 % — ABNORMAL LOW (ref 39.0–52.0)
HEMOGLOBIN: 11.9 g/dL — AB (ref 13.0–17.0)
LYMPHS PCT: 16 %
Lymphs Abs: 2.6 10*3/uL (ref 0.7–4.0)
MCH: 27 pg (ref 26.0–34.0)
MCHC: 31.5 g/dL (ref 30.0–36.0)
MCV: 85.7 fL (ref 78.0–100.0)
MONO ABS: 1.8 10*3/uL — AB (ref 0.1–1.0)
Monocytes Relative: 11 %
NEUTROS PCT: 73 %
Neutro Abs: 11.9 10*3/uL — ABNORMAL HIGH (ref 1.7–7.7)
PLATELETS: 222 10*3/uL (ref 150–400)
RBC: 4.41 MIL/uL (ref 4.22–5.81)
RDW: 22 % — ABNORMAL HIGH (ref 11.5–15.5)
WBC: 16.3 10*3/uL — AB (ref 4.0–10.5)

## 2017-01-30 LAB — COMPREHENSIVE METABOLIC PANEL
ALK PHOS: 87 U/L (ref 38–126)
ALT: 25 U/L (ref 17–63)
ANION GAP: 12 (ref 5–15)
AST: 26 U/L (ref 15–41)
Albumin: 3.3 g/dL — ABNORMAL LOW (ref 3.5–5.0)
BUN: 20 mg/dL (ref 6–20)
CALCIUM: 9 mg/dL (ref 8.9–10.3)
CO2: 31 mmol/L (ref 22–32)
CREATININE: 1.56 mg/dL — AB (ref 0.61–1.24)
Chloride: 99 mmol/L — ABNORMAL LOW (ref 101–111)
GFR, EST AFRICAN AMERICAN: 47 mL/min — AB (ref >60)
GFR, EST NON AFRICAN AMERICAN: 40 mL/min — AB (ref >60)
Glucose, Bld: 118 mg/dL — ABNORMAL HIGH (ref 65–99)
Potassium: 3.6 mmol/L (ref 3.5–5.1)
SODIUM: 142 mmol/L (ref 135–145)
Total Bilirubin: 0.8 mg/dL (ref 0.3–1.2)
Total Protein: 7.5 g/dL (ref 6.5–8.1)

## 2017-01-30 LAB — I-STAT CG4 LACTIC ACID, ED
LACTIC ACID, VENOUS: 2.31 mmol/L — AB (ref 0.5–1.9)
Lactic Acid, Venous: 1.03 mmol/L (ref 0.5–1.9)

## 2017-01-30 MED ORDER — MORPHINE SULFATE (PF) 4 MG/ML IV SOLN
4.0000 mg | Freq: Once | INTRAVENOUS | Status: AC
Start: 2017-01-30 — End: 2017-01-30
  Administered 2017-01-30: 4 mg via INTRAVENOUS
  Filled 2017-01-30: qty 1

## 2017-01-30 MED ORDER — NYSTATIN 100000 UNIT/ML MT SUSP: 500000.0000 [IU] | Freq: Four times a day (QID) | OROMUCOSAL | 0 refills | Status: DC

## 2017-01-30 MED ORDER — CLINDAMYCIN PHOSPHATE 600 MG/50ML IV SOLN
600.0000 mg | Freq: Once | INTRAVENOUS | Status: AC
  Administered 2017-01-30: 600 mg via INTRAVENOUS
  Filled 2017-01-30: qty 50

## 2017-01-30 MED ORDER — CLINDAMYCIN HCL 300 MG PO CAPS: 300.0000 mg | ORAL_CAPSULE | Freq: Four times a day (QID) | ORAL | 0 refills | Status: DC

## 2017-01-30 MED ORDER — NYSTATIN 100000 UNIT/GM EX POWD
Freq: Once | CUTANEOUS | Status: AC
  Administered 2017-01-30: 22:00:00 via TOPICAL
  Filled 2017-01-30: qty 15

## 2017-01-30 MED ORDER — SODIUM CHLORIDE 0.9 % IV BOLUS (SEPSIS)
1000.0000 mL | Freq: Once | INTRAVENOUS | Status: AC
  Administered 2017-01-30: 1000 mL via INTRAVENOUS
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
## 2017-01-30 NOTE — ED Triage Notes (Addendum)
Patient brought in by in law for boils on arm and buttock and genital area. Patient HR in triage room going from 60s to 27bpm.  When asking if patient has PMH a fib patient unsure and family member doesn't know. Patient going on 27th for porta cath placement to continue chemo treatments.  Patient denies any chest pain or SOB,just pain in genital area from sores.

## 2017-01-30 NOTE — ED Notes (Signed)
Pt's Lactic Acid was 2.31. Cherylann Ratel, MD and RN.

## 2017-01-30 NOTE — Discharge Instructions (Signed)
Stay hydrated.   Take clindamycin as prescribed.   Use nystatin to the groin four times daily.   See your doctor in 2-3 days for follow up   Recheck kidney function with your doctor next week   Return to ER if you have fever, worse rash, severe groin pain, purulent drainage from the groin

## 2017-01-31 ENCOUNTER — Ambulatory Visit: Payer: Medicare HMO | Admitting: Internal Medicine

## 2017-01-31 ENCOUNTER — Ambulatory Visit: Payer: Medicare HMO

## 2017-01-31 ENCOUNTER — Other Ambulatory Visit: Payer: Medicare HMO

## 2017-01-31 ENCOUNTER — Other Ambulatory Visit: Payer: Self-pay | Admitting: Internal Medicine

## 2017-02-01 ENCOUNTER — Emergency Department (HOSPITAL_COMMUNITY): Payer: Medicare HMO

## 2017-02-01 ENCOUNTER — Inpatient Hospital Stay (HOSPITAL_COMMUNITY)
Admission: EM | Admit: 2017-02-01 | Discharge: 2017-02-04 | DRG: 683 | Disposition: A | Discharge status: Home / Self Care | Payer: Medicare HMO | Attending: Internal Medicine | Admitting: Internal Medicine

## 2017-02-01 ENCOUNTER — Other Ambulatory Visit: Payer: Medicare HMO

## 2017-02-01 ENCOUNTER — Other Ambulatory Visit (HOSPITAL_COMMUNITY): Payer: Medicare HMO

## 2017-02-01 ENCOUNTER — Encounter (HOSPITAL_COMMUNITY): Payer: Self-pay | Admitting: Emergency Medicine

## 2017-02-01 ENCOUNTER — Ambulatory Visit (HOSPITAL_COMMUNITY): Admission: RE | Admit: 2017-02-01 | Payer: Medicare HMO | Source: Ambulatory Visit

## 2017-02-01 DIAGNOSIS — N492 Inflammatory disorders of scrotum: Secondary | ICD-10-CM

## 2017-02-01 DIAGNOSIS — J449 Chronic obstructive pulmonary disease, unspecified: Secondary | ICD-10-CM | POA: Diagnosis present

## 2017-02-01 DIAGNOSIS — E876 Hypokalemia: Secondary | ICD-10-CM | POA: Diagnosis present

## 2017-02-01 DIAGNOSIS — L03114 Cellulitis of left upper limb: Secondary | ICD-10-CM | POA: Diagnosis present

## 2017-02-01 DIAGNOSIS — M109 Gout, unspecified: Secondary | ICD-10-CM | POA: Diagnosis present

## 2017-02-01 DIAGNOSIS — K219 Gastro-esophageal reflux disease without esophagitis: Secondary | ICD-10-CM | POA: Diagnosis present

## 2017-02-01 DIAGNOSIS — Z888 Allergy status to other drugs, medicaments and biological substances status: Secondary | ICD-10-CM

## 2017-02-01 DIAGNOSIS — N183 Chronic kidney disease, stage 3 (moderate): Secondary | ICD-10-CM | POA: Diagnosis present

## 2017-02-01 DIAGNOSIS — Z79899 Other long term (current) drug therapy: Secondary | ICD-10-CM

## 2017-02-01 DIAGNOSIS — N182 Chronic kidney disease, stage 2 (mild): Secondary | ICD-10-CM | POA: Diagnosis present

## 2017-02-01 DIAGNOSIS — I5032 Chronic diastolic (congestive) heart failure: Secondary | ICD-10-CM | POA: Diagnosis present

## 2017-02-01 DIAGNOSIS — Z87891 Personal history of nicotine dependence: Secondary | ICD-10-CM

## 2017-02-01 DIAGNOSIS — E785 Hyperlipidemia, unspecified: Secondary | ICD-10-CM | POA: Diagnosis present

## 2017-02-01 DIAGNOSIS — Z9221 Personal history of antineoplastic chemotherapy: Secondary | ICD-10-CM

## 2017-02-01 DIAGNOSIS — I130 Hypertensive heart and chronic kidney disease with heart failure and stage 1 through stage 4 chronic kidney disease, or unspecified chronic kidney disease: Secondary | ICD-10-CM | POA: Diagnosis present

## 2017-02-01 DIAGNOSIS — N5089 Other specified disorders of the male genital organs: Secondary | ICD-10-CM | POA: Diagnosis present

## 2017-02-01 DIAGNOSIS — D649 Anemia, unspecified: Secondary | ICD-10-CM | POA: Diagnosis present

## 2017-02-01 DIAGNOSIS — F329 Major depressive disorder, single episode, unspecified: Secondary | ICD-10-CM | POA: Diagnosis present

## 2017-02-01 DIAGNOSIS — N179 Acute kidney failure, unspecified: Principal | ICD-10-CM | POA: Diagnosis present

## 2017-02-01 DIAGNOSIS — C771 Secondary and unspecified malignant neoplasm of intrathoracic lymph nodes: Secondary | ICD-10-CM | POA: Diagnosis present

## 2017-02-01 DIAGNOSIS — B3749 Other urogenital candidiasis: Secondary | ICD-10-CM | POA: Diagnosis present

## 2017-02-01 DIAGNOSIS — L03314 Cellulitis of groin: Secondary | ICD-10-CM

## 2017-02-01 DIAGNOSIS — C3411 Malignant neoplasm of upper lobe, right bronchus or lung: Secondary | ICD-10-CM | POA: Diagnosis present

## 2017-02-01 HISTORY — DX: Cellulitis, unspecified: L03.90

## 2017-02-01 LAB — COMPREHENSIVE METABOLIC PANEL
ALT: 17 U/L (ref 17–63)
AST: 24 U/L (ref 15–41)
Albumin: 2.8 g/dL — ABNORMAL LOW (ref 3.5–5.0)
Alkaline Phosphatase: 68 U/L (ref 38–126)
Anion gap: 16 — ABNORMAL HIGH (ref 5–15)
BUN: 23 mg/dL — AB (ref 6–20)
CO2: 25 mmol/L (ref 22–32)
Calcium: 8.5 mg/dL — ABNORMAL LOW (ref 8.9–10.3)
Chloride: 100 mmol/L — ABNORMAL LOW (ref 101–111)
Creatinine, Ser: 1.91 mg/dL — ABNORMAL HIGH (ref 0.61–1.24)
GFR, EST AFRICAN AMERICAN: 37 mL/min — AB (ref >60)
GFR, EST NON AFRICAN AMERICAN: 32 mL/min — AB (ref >60)
Glucose, Bld: 166 mg/dL — ABNORMAL HIGH (ref 65–99)
POTASSIUM: 3.3 mmol/L — AB (ref 3.5–5.1)
Sodium: 141 mmol/L (ref 135–145)
Total Bilirubin: 0.5 mg/dL (ref 0.3–1.2)
Total Protein: 6.6 g/dL (ref 6.5–8.1)

## 2017-02-01 LAB — CBC WITH DIFFERENTIAL/PLATELET
Basophils Absolute: 0 10*3/uL (ref 0.0–0.1)
Basophils Relative: 0 %
EOS PCT: 1 %
Eosinophils Absolute: 0.1 10*3/uL (ref 0.0–0.7)
HEMATOCRIT: 35 % — AB (ref 39.0–52.0)
Hemoglobin: 10.4 g/dL — ABNORMAL LOW (ref 13.0–17.0)
Lymphocytes Relative: 10 %
Lymphs Abs: 1.1 10*3/uL (ref 0.7–4.0)
MCH: 25.9 pg — ABNORMAL LOW (ref 26.0–34.0)
MCHC: 29.7 g/dL — ABNORMAL LOW (ref 30.0–36.0)
MCV: 87.3 fL (ref 78.0–100.0)
MONOS PCT: 15 %
Monocytes Absolute: 1.7 10*3/uL — ABNORMAL HIGH (ref 0.1–1.0)
NEUTROS PCT: 74 %
Neutro Abs: 8.4 10*3/uL — ABNORMAL HIGH (ref 1.7–7.7)
Platelets: 228 10*3/uL (ref 150–400)
RBC: 4.01 MIL/uL — AB (ref 4.22–5.81)
RDW: 21.9 % — ABNORMAL HIGH (ref 11.5–15.5)
WBC: 11.3 10*3/uL — AB (ref 4.0–10.5)

## 2017-02-01 LAB — URINALYSIS, ROUTINE W REFLEX MICROSCOPIC
Glucose, UA: NEGATIVE mg/dL
Hgb urine dipstick: NEGATIVE
Ketones, ur: NEGATIVE mg/dL
Nitrite: NEGATIVE
PROTEIN: 30 mg/dL — AB
SPECIFIC GRAVITY, URINE: 1.033 — AB (ref 1.005–1.030)
pH: 5 (ref 5.0–8.0)

## 2017-02-01 LAB — I-STAT CG4 LACTIC ACID, ED
LACTIC ACID, VENOUS: 2.65 mmol/L — AB (ref 0.5–1.9)
Lactic Acid, Venous: 2.46 mmol/L (ref 0.5–1.9)

## 2017-02-01 LAB — ETHANOL

## 2017-02-01 LAB — RAPID URINE DRUG SCREEN, HOSP PERFORMED
AMPHETAMINES: NOT DETECTED
BARBITURATES: NOT DETECTED
Benzodiazepines: NOT DETECTED
COCAINE: NOT DETECTED
Opiates: POSITIVE — AB
TETRAHYDROCANNABINOL: NOT DETECTED

## 2017-02-01 LAB — ACETAMINOPHEN LEVEL: Acetaminophen (Tylenol), Serum: 27 ug/mL (ref 10–30)

## 2017-02-01 LAB — SALICYLATE LEVEL: Salicylate Lvl: <7 mg/dL (ref 2.8–30.0)

## 2017-02-01 MED ORDER — VANCOMYCIN HCL 10 G IV SOLR
1250.0000 mg | INTRAVENOUS | Status: DC
  Administered 2017-02-02 – 2017-02-03 (×2): 1250 mg via INTRAVENOUS
  Filled 2017-02-01 (×2): qty 1250

## 2017-02-01 MED ORDER — HALOPERIDOL 1 MG PO TABS
1.0000 mg | ORAL_TABLET | Freq: Once | ORAL | Status: AC
  Administered 2017-02-01: 1 mg via ORAL
  Filled 2017-02-01: qty 1

## 2017-02-01 MED ORDER — SODIUM CHLORIDE 0.9 % IV SOLN
INTRAVENOUS | Status: DC
  Administered 2017-02-01: 21:00:00 via INTRAVENOUS

## 2017-02-01 MED ORDER — SODIUM CHLORIDE 0.9 % IV BOLUS (SEPSIS): 1000.0000 mL | Freq: Once | INTRAVENOUS | Status: DC

## 2017-02-01 MED ORDER — LIDOCAINE HCL 2 % EX GEL
1.0000 "application " | Freq: Once | CUTANEOUS | Status: AC
  Administered 2017-02-01: 1 via TOPICAL

## 2017-02-01 MED ORDER — LORAZEPAM 2 MG/ML IJ SOLN
2.0000 mg | Freq: Once | INTRAMUSCULAR | Status: AC
  Administered 2017-02-01: 2 mg via INTRAMUSCULAR
  Filled 2017-02-01: qty 1

## 2017-02-01 MED ORDER — NYSTATIN 100000 UNIT/GM EX POWD
Freq: Once | CUTANEOUS | Status: AC
  Administered 2017-02-01: 16:00:00 via TOPICAL
  Filled 2017-02-01: qty 15

## 2017-02-01 MED ORDER — FENTANYL CITRATE (PF) 100 MCG/2ML IJ SOLN
50.0000 ug | Freq: Once | INTRAMUSCULAR | Status: AC
  Administered 2017-02-01: 50 ug via INTRAVENOUS
  Filled 2017-02-01: qty 2

## 2017-02-01 MED ORDER — IOPAMIDOL (ISOVUE-300) INJECTION 61%
INTRAVENOUS | Status: AC
  Administered 2017-02-01: 100 mL
  Filled 2017-02-01: qty 100

## 2017-02-01 MED ORDER — VANCOMYCIN HCL 10 G IV SOLR
2000.0000 mg | Freq: Once | INTRAVENOUS | Status: AC
  Administered 2017-02-01: 2000 mg via INTRAVENOUS
  Filled 2017-02-01: qty 2000

## 2017-02-01 MED ORDER — POTASSIUM CHLORIDE CRYS ER 20 MEQ PO TBCR
30.0000 meq | EXTENDED_RELEASE_TABLET | Freq: Once | ORAL | Status: AC
  Administered 2017-02-01: 19:00:00 30 meq via ORAL
  Filled 2017-02-01: qty 1

## 2017-02-01 MED ORDER — SODIUM CHLORIDE 0.9 % IV BOLUS (SEPSIS)
500.0000 mL | Freq: Once | INTRAVENOUS | Status: AC
  Administered 2017-02-01: 500 mL via INTRAVENOUS

## 2017-02-01 MED ORDER — PIPERACILLIN-TAZOBACTAM 3.375 G IVPB
3.3750 g | Freq: Three times a day (TID) | INTRAVENOUS | Status: DC
  Administered 2017-02-02: 3.375 g via INTRAVENOUS
  Filled 2017-02-01 (×3): qty 50

## 2017-02-01 MED ORDER — PIPERACILLIN-TAZOBACTAM 3.375 G IVPB 30 MIN
3.3750 g | Freq: Once | INTRAVENOUS | Status: AC
  Administered 2017-02-01: 3.375 g via INTRAVENOUS
  Filled 2017-02-01: qty 50

## 2017-02-01 NOTE — ED Provider Notes (Signed)
I saw and evaluated the patient, reviewed the resident's note and I agree with the findings and plan.   EKG Interpretation None       Patient presents for evaluation of worsening groin infection, thought to be "fungal."  He is having worsening pain and inability to ambulate secondary to that.  He has resisted putting lotion on it, which was prescribed, at the early ED visit.  Alert elderly man.  He is blind.  Genitals, slightly swollen penis and scrotum.  He is uncircumcised.  Multiple areas of induration, with clinical appearance for primary herpes genitalis.  Difficult examination secondary to pain limits complete clinical exam.  Will evaluate further with CT imaging, and treat for superficial infection.  Urology Consultation, admission to a hospitalist.   Daleen Bo, MD 02/05/17 1041

## 2017-02-01 NOTE — ED Notes (Signed)
pts family now reports pt states he took 4-10/325 oxycodone an hour ago, family states pt has some memory issues and said his doctor told him to take that many at once. Pt alert and able to answer questions, resp e/u, nad.

## 2017-02-01 NOTE — ED Notes (Signed)
Pt yanked IV out and only had about 250 ml of the antibiotic. Pt is very addement about going home. He stated he was not staying here. Admitting was paged.

## 2017-02-01 NOTE — ED Triage Notes (Signed)
pts family reports pt was seen at [facility] 2 days ago for boils on his left arm and groin. pts fam member reports that patient has a cream but will not allow them to put it on him. Pt is on chemo for lunch cancer. resp e/u, nad.
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
## 2017-02-01 NOTE — ED Notes (Addendum)
Pt will not keep any monitors on and is up at the side of the bed.

## 2017-02-01 NOTE — ED Notes (Signed)
Lidocaine jelly applied at this time.
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
## 2017-02-01 NOTE — ED Notes (Signed)
Patient transported to CT 

## 2017-02-01 NOTE — ED Notes (Signed)
IV team at bedside have obtained a line at this time.
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
## 2017-02-01 NOTE — ED Notes (Signed)
Per daughter- he lives at home with stepdaughter. She states he refuses care and urinates anywhere he wants to. Patient is blind.

## 2017-02-01 NOTE — Progress Notes (Addendum)
Pharmacy Antibiotic Note  Saim Almanza is a 82 y.o. male admitted on 02/01/2017 with cellulitis of groin region, possible fungal infection and possible HSV infection. Starting vancomycin initially to cover for cellulitis. SCr up to 1.9, eCrCl 30-40 ml/min. Pt is also being treated for lung cancer - on chemotherapy.     Plan: -Vancomycin 2 g IV x1 then 1250 mg IV q24h -Monitor renal fx, cultures, VT as needed -Zosyn 3.375 g IV q8h     Temp (24hrs), Avg:97.9 F (36.6 C), Min:97.9 F (36.6 C), Max:97.9 F (36.6 C)   Recent Labs Lab 01/30/17 2005 01/30/17 2047 01/30/17 2300 02/01/17 1205 02/01/17 1221 02/01/17 1451  WBC 16.3*  --   --  11.3*  --   --   CREATININE 1.56*  --   --  1.91*  --   --   LATICACIDVEN  --  2.31* 1.03  --  2.65* 2.46*    Estimated Creatinine Clearance: 37.7 mL/min (A) (by C-G formula based on SCr of 1.91 mg/dL (H)).    Allergies  Allergen Reactions  . Amlodipine Swelling    Swelling of legs. (Patient unsure if he is actually allergic to Amlodipine)    Antimicrobials this admission: 9/27 vancomycin > 9/27 zosyn >   Dose adjustments this admission: N/A   Microbiology results: 9/27 blood cx:   Harvel Quale 02/01/2017 3:20 PM

## 2017-02-01 NOTE — ED Notes (Signed)
Patient has excoriated area to groin; smelly very yeasty; has dime sized pustule to left scrotal sac. Entire groin area is infected and srotum is swollen. Penis is uncircumsized with foreskin appearing to also have fungal infection. MD to take pic for chart. Lido jelly applied for pain management. 3 attempts at IV by 2 nurses. IV team consulted. Pt was to have port placed today. Hx of lung CA.

## 2017-02-02 ENCOUNTER — Ambulatory Visit: Payer: Medicare HMO

## 2017-02-02 ENCOUNTER — Encounter (HOSPITAL_COMMUNITY): Payer: Self-pay | Admitting: General Practice

## 2017-02-02 ENCOUNTER — Other Ambulatory Visit: Payer: Medicare HMO

## 2017-02-02 DIAGNOSIS — B372 Candidiasis of skin and nail: Secondary | ICD-10-CM | POA: Diagnosis not present

## 2017-02-02 DIAGNOSIS — J449 Chronic obstructive pulmonary disease, unspecified: Secondary | ICD-10-CM | POA: Diagnosis present

## 2017-02-02 DIAGNOSIS — K219 Gastro-esophageal reflux disease without esophagitis: Secondary | ICD-10-CM | POA: Diagnosis present

## 2017-02-02 DIAGNOSIS — R2232 Localized swelling, mass and lump, left upper limb: Secondary | ICD-10-CM | POA: Diagnosis not present

## 2017-02-02 DIAGNOSIS — L03119 Cellulitis of unspecified part of limb: Secondary | ICD-10-CM | POA: Diagnosis not present

## 2017-02-02 DIAGNOSIS — N5082 Scrotal pain: Secondary | ICD-10-CM | POA: Diagnosis not present

## 2017-02-02 DIAGNOSIS — Z87891 Personal history of nicotine dependence: Secondary | ICD-10-CM | POA: Diagnosis not present

## 2017-02-02 DIAGNOSIS — E876 Hypokalemia: Secondary | ICD-10-CM | POA: Diagnosis present

## 2017-02-02 DIAGNOSIS — D649 Anemia, unspecified: Secondary | ICD-10-CM | POA: Diagnosis present

## 2017-02-02 DIAGNOSIS — I5030 Unspecified diastolic (congestive) heart failure: Secondary | ICD-10-CM | POA: Diagnosis not present

## 2017-02-02 DIAGNOSIS — N492 Inflammatory disorders of scrotum: Secondary | ICD-10-CM | POA: Diagnosis present

## 2017-02-02 DIAGNOSIS — L03314 Cellulitis of groin: Secondary | ICD-10-CM | POA: Diagnosis not present

## 2017-02-02 DIAGNOSIS — M542 Cervicalgia: Secondary | ICD-10-CM | POA: Diagnosis not present

## 2017-02-02 DIAGNOSIS — B3749 Other urogenital candidiasis: Secondary | ICD-10-CM | POA: Diagnosis present

## 2017-02-02 DIAGNOSIS — M109 Gout, unspecified: Secondary | ICD-10-CM | POA: Diagnosis present

## 2017-02-02 DIAGNOSIS — C3411 Malignant neoplasm of upper lobe, right bronchus or lung: Secondary | ICD-10-CM | POA: Diagnosis present

## 2017-02-02 DIAGNOSIS — I5032 Chronic diastolic (congestive) heart failure: Secondary | ICD-10-CM | POA: Diagnosis present

## 2017-02-02 DIAGNOSIS — Z888 Allergy status to other drugs, medicaments and biological substances status: Secondary | ICD-10-CM | POA: Diagnosis not present

## 2017-02-02 DIAGNOSIS — N179 Acute kidney failure, unspecified: Secondary | ICD-10-CM | POA: Diagnosis present

## 2017-02-02 DIAGNOSIS — I130 Hypertensive heart and chronic kidney disease with heart failure and stage 1 through stage 4 chronic kidney disease, or unspecified chronic kidney disease: Secondary | ICD-10-CM | POA: Diagnosis present

## 2017-02-02 DIAGNOSIS — E785 Hyperlipidemia, unspecified: Secondary | ICD-10-CM | POA: Diagnosis present

## 2017-02-02 DIAGNOSIS — Z9221 Personal history of antineoplastic chemotherapy: Secondary | ICD-10-CM | POA: Diagnosis not present

## 2017-02-02 DIAGNOSIS — L03114 Cellulitis of left upper limb: Secondary | ICD-10-CM | POA: Diagnosis present

## 2017-02-02 DIAGNOSIS — L0390 Cellulitis, unspecified: Secondary | ICD-10-CM

## 2017-02-02 DIAGNOSIS — C771 Secondary and unspecified malignant neoplasm of intrathoracic lymph nodes: Secondary | ICD-10-CM | POA: Diagnosis present

## 2017-02-02 DIAGNOSIS — F329 Major depressive disorder, single episode, unspecified: Secondary | ICD-10-CM | POA: Diagnosis present

## 2017-02-02 DIAGNOSIS — L02414 Cutaneous abscess of left upper limb: Secondary | ICD-10-CM | POA: Diagnosis not present

## 2017-02-02 DIAGNOSIS — N183 Chronic kidney disease, stage 3 (moderate): Secondary | ICD-10-CM | POA: Diagnosis present

## 2017-02-02 DIAGNOSIS — Z79899 Other long term (current) drug therapy: Secondary | ICD-10-CM | POA: Diagnosis not present

## 2017-02-02 DIAGNOSIS — N182 Chronic kidney disease, stage 2 (mild): Secondary | ICD-10-CM | POA: Diagnosis not present

## 2017-02-02 HISTORY — DX: Cellulitis, unspecified: L03.90

## 2017-02-02 LAB — COMPREHENSIVE METABOLIC PANEL
ALT: 17 U/L (ref 17–63)
ANION GAP: 9 (ref 5–15)
AST: 22 U/L (ref 15–41)
Albumin: 2.7 g/dL — ABNORMAL LOW (ref 3.5–5.0)
Alkaline Phosphatase: 67 U/L (ref 38–126)
BUN: 21 mg/dL — AB (ref 6–20)
CALCIUM: 8.5 mg/dL — AB (ref 8.9–10.3)
CHLORIDE: 106 mmol/L (ref 101–111)
CO2: 28 mmol/L (ref 22–32)
CREATININE: 1.58 mg/dL — AB (ref 0.61–1.24)
GFR calc non Af Amer: 40 mL/min — ABNORMAL LOW (ref >60)
GFR, EST AFRICAN AMERICAN: 46 mL/min — AB (ref >60)
GLUCOSE: 95 mg/dL (ref 65–99)
POTASSIUM: 3.1 mmol/L — AB (ref 3.5–5.1)
Sodium: 143 mmol/L (ref 135–145)
TOTAL PROTEIN: 6.3 g/dL — AB (ref 6.5–8.1)
Total Bilirubin: 0.8 mg/dL (ref 0.3–1.2)

## 2017-02-02 LAB — CBC
HCT: 33.8 % — ABNORMAL LOW (ref 39.0–52.0)
Hemoglobin: 10.2 g/dL — ABNORMAL LOW (ref 13.0–17.0)
MCH: 26.8 pg (ref 26.0–34.0)
MCHC: 30.2 g/dL (ref 30.0–36.0)
MCV: 88.7 fL (ref 78.0–100.0)
PLATELETS: 210 10*3/uL (ref 150–400)
RBC: 3.81 MIL/uL — ABNORMAL LOW (ref 4.22–5.81)
RDW: 22.4 % — AB (ref 11.5–15.5)
WBC: 11.7 10*3/uL — AB (ref 4.0–10.5)

## 2017-02-02 LAB — GLUCOSE, CAPILLARY: Glucose-Capillary: 100 mg/dL — ABNORMAL HIGH (ref 65–99)

## 2017-02-02 MED ORDER — ALBUTEROL SULFATE (2.5 MG/3ML) 0.083% IN NEBU
2.5000 mg | INHALATION_SOLUTION | RESPIRATORY_TRACT | Status: DC | PRN
  Filled 2017-02-02: qty 3

## 2017-02-02 MED ORDER — ADULT MULTIVITAMIN W/MINERALS CH
1.0000 | ORAL_TABLET | Freq: Every day | ORAL | Status: DC
  Administered 2017-02-02 – 2017-02-04 (×3): 1 via ORAL
  Filled 2017-02-02 (×4): qty 1

## 2017-02-02 MED ORDER — PANTOPRAZOLE SODIUM 40 MG PO TBEC
40.0000 mg | DELAYED_RELEASE_TABLET | Freq: Every day | ORAL | Status: DC
  Administered 2017-02-02 – 2017-02-04 (×3): 40 mg via ORAL
  Filled 2017-02-02 (×3): qty 1

## 2017-02-02 MED ORDER — ROSUVASTATIN CALCIUM 10 MG PO TABS
10.0000 mg | ORAL_TABLET | Freq: Every day | ORAL | Status: DC
  Administered 2017-02-02 – 2017-02-04 (×3): 10 mg via ORAL
  Filled 2017-02-02 (×3): qty 1

## 2017-02-02 MED ORDER — OXYCODONE-ACETAMINOPHEN 5-325 MG PO TABS
1.0000 | ORAL_TABLET | Freq: Three times a day (TID) | ORAL | Status: DC | PRN
  Administered 2017-02-03 – 2017-02-04 (×2): 1 via ORAL
  Filled 2017-02-02 (×2): qty 1

## 2017-02-02 MED ORDER — POTASSIUM CHLORIDE CRYS ER 20 MEQ PO TBCR
40.0000 meq | EXTENDED_RELEASE_TABLET | Freq: Two times a day (BID) | ORAL | Status: AC
  Administered 2017-02-02 (×2): 40 meq via ORAL
  Filled 2017-02-02 (×2): qty 2

## 2017-02-02 MED ORDER — CARVEDILOL 25 MG PO TABS
25.0000 mg | ORAL_TABLET | Freq: Two times a day (BID) | ORAL | Status: DC
  Administered 2017-02-02 – 2017-02-04 (×5): 25 mg via ORAL
  Filled 2017-02-02 (×6): qty 1

## 2017-02-02 MED ORDER — SODIUM CHLORIDE 0.9% FLUSH
3.0000 mL | Freq: Two times a day (BID) | INTRAVENOUS | Status: DC
  Administered 2017-02-02 – 2017-02-03 (×4): 3 mL via INTRAVENOUS
  Administered 2017-02-04: 10 mL via INTRAVENOUS

## 2017-02-02 MED ORDER — GABAPENTIN 600 MG PO TABS
600.0000 mg | ORAL_TABLET | Freq: Two times a day (BID) | ORAL | Status: DC
  Administered 2017-02-02 – 2017-02-04 (×6): 600 mg via ORAL
  Filled 2017-02-02 (×6): qty 1

## 2017-02-02 MED ORDER — PROMETHAZINE HCL 25 MG PO TABS: 12.5000 mg | ORAL_TABLET | Freq: Four times a day (QID) | ORAL | Status: DC | PRN

## 2017-02-02 MED ORDER — OXYCODONE-ACETAMINOPHEN 10-325 MG PO TABS: 1.0000 | ORAL_TABLET | Freq: Three times a day (TID) | ORAL | Status: DC | PRN

## 2017-02-02 MED ORDER — ENSURE ENLIVE PO LIQD
237.0000 mL | Freq: Two times a day (BID) | ORAL | Status: DC
  Administered 2017-02-02 – 2017-02-04 (×5): 237 mL via ORAL

## 2017-02-02 MED ORDER — ACETAMINOPHEN 650 MG RE SUPP: 650.0000 mg | Freq: Four times a day (QID) | RECTAL | Status: DC | PRN

## 2017-02-02 MED ORDER — OXYCODONE HCL 5 MG PO TABS
5.0000 mg | ORAL_TABLET | Freq: Three times a day (TID) | ORAL | Status: DC | PRN
  Administered 2017-02-03 – 2017-02-04 (×2): 5 mg via ORAL
  Filled 2017-02-02 (×2): qty 1

## 2017-02-02 MED ORDER — ACETAMINOPHEN 325 MG PO TABS: 650.0000 mg | ORAL_TABLET | Freq: Four times a day (QID) | ORAL | Status: DC | PRN

## 2017-02-02 MED ORDER — SENNOSIDES-DOCUSATE SODIUM 8.6-50 MG PO TABS: 1.0000 | ORAL_TABLET | Freq: Every evening | ORAL | Status: DC | PRN

## 2017-02-02 MED ORDER — HEPARIN SODIUM (PORCINE) 5000 UNIT/ML IJ SOLN
5000.0000 [IU] | Freq: Three times a day (TID) | INTRAMUSCULAR | Status: DC
  Administered 2017-02-02 – 2017-02-04 (×6): 5000 [IU] via SUBCUTANEOUS
  Filled 2017-02-02 (×5): qty 1

## 2017-02-02 MED ORDER — FOLIC ACID 1 MG PO TABS
1.0000 mg | ORAL_TABLET | Freq: Every day | ORAL | Status: DC
  Administered 2017-02-02 – 2017-02-04 (×3): 1 mg via ORAL
  Filled 2017-02-02 (×3): qty 1

## 2017-02-02 MED ORDER — FUROSEMIDE 20 MG PO TABS
20.0000 mg | ORAL_TABLET | Freq: Every day | ORAL | Status: DC
  Administered 2017-02-02 – 2017-02-04 (×3): 20 mg via ORAL
  Filled 2017-02-02 (×3): qty 1

## 2017-02-02 NOTE — Care Management Obs Status (Signed)
[facility] NOTIFICATION   Patient Details  Name: Nicholas [name] MRN: [id number] Date of Birth: [birth year]   Medicare Observation Status Notification Given:  Yes    Anshul Meddings, Rory Percy, RN 02/02/2017, 3:53 PM

## 2017-02-02 NOTE — Progress Notes (Signed)
Initial Nutrition Assessment  DOCUMENTATION CODES:   Obesity unspecified  INTERVENTION:   -Continue Ensure Enlive po BID, each supplement provides 350 kcal and 20 grams of protein -MVI daily  NUTRITION DIAGNOSIS:   Increased nutrient needs related to wound healing as evidenced by estimated needs.  GOAL:   Patient will meet greater than or equal to 90% of their needs  MONITOR:   PO intake, Supplement acceptance, Labs, Weight trends, Skin, I & O's  REASON FOR ASSESSMENT:   Malnutrition Screening Tool    ASSESSMENT:   Nicholas [name] is a 82 y.o. Male with a PMHx significant for Stage 4 non-small cell lung cancer currently undergoing chemotherapy with carboplatin and paclitaxil who presented to the ED with a 6 day history of groin pain.  Pt admitted with cellulitis of scrotum.   Spoke with pt and daughter at bedside. Pt unable to provide recent nutrition hx, stating "I don't really remember the past 6 days because I was up kicking and screaming at people". Pt daughter reports that pt has had a variable appetite over the past 8 months, due to cancer treatments. Pt can be a selective eater, but will typically eat if he likes food items served. Pt has dentures and will use them for eating- noted pt without top teeth, but denied offer of mechanically altered diet.   Pt reports UBW around 250#. Pt daughter confirms that pt has lost about 25# over the past 8 months. Per wt hx, however, wt has been stable over the past 7 months.   Nutrition-Focused physical exam completed. Findings are no fat depletion, no muscle depletion, and no edema.   Discussed with pt and daughter importance of good meal and supplement intake to promote healing. Pt amenable to continue Ensure supplements; advised daughter to use at home at times of poor oral intake.   Medications reviewed and include folvite.  Labs reviewed: K: 3.1, CBGS: 100.   Diet Order:  Diet regular Room service appropriate? Yes; Fluid  consistency: Thin  Skin:  Wound (see comment) (full thickness wounds to scrotum)  Last BM:  02/01/17  Height:   Ht Readings from Last 1 Encounters:  01/26/17 5\' 11"  (1.803 m)    Weight:   Wt Readings from Last 1 Encounters:  02/02/17 228 lb 13.4 oz (103.8 kg)    Ideal Body Weight:  75.5 kg  BMI:  Body mass index is 31.92 kg/m.  Estimated Nutritional Needs:   Kcal:  2000-2200  Protein:  90-105 grams  Fluid:  2.0-2.2 L  EDUCATION NEEDS:   Education needs addressed  Nicholas [name] A. Jimmye Norman, RD, LDN, CDE Pager: [phone] After hours Pager: [phone]

## 2017-02-02 NOTE — Progress Notes (Signed)
  Date: 02/02/2017  Patient name: Nicholas [name]  Medical record number: [id number]  Date of birth: Apr 22, 1935   I have seen and evaluated Nicholas [name] and discussed their care with the Residency Team. Nicholas [name] is an 82 year old man with past medical history significant on stage IV non-small cell lung cancer currently undergoing chemotherapy. He presents with a chief complaint of 6 days of scrotal pain. He noticed a rash or about September 21 and was evaluated in the ED on the 25th. He was diagnosed with a cellulitis along with a superimposed yeast infection. He was prescribed clindamycin which he took and nystatin which he did not use. He was not getting better and in addition developed some altered mental status and presented to the ED. Prior to coming to the ED, he allegedly took 4 of his oxycodone 10 mg. He was evaluated by urology who wanted to add Zosyn to his vancomycin and did not feel that any additional intervention is needed. This morning, he is interactive with the team. He also complains of a left arm abscess.  Vitals:   02/02/17 0500 02/02/17 1000  BP: (!) 147/71 (!) 125/54  Pulse: 77 94  Resp: 17 18  Temp: 99.1 F (37.3 C) 98.7 F (37.1 C)  SpO2: 98% 98%  GEN alert, NAD, conversant HRRR no MRG LCTAB good air flow ABD +BS, soft Scrotum and inner thighs - less erythema, tenderness to manipulation, scant discharge, no odor Neuro oriented to person, place, city, state  Cr 1.91 - 1.58, baseline about 1.2 K 3.1 WBC 11.3 - 11.7 HgB 10.2 UA - nitrite, lg leuks, TNTC WBC  Assessment and Plan: I have seen and evaluated the patient as outlined above. I agree with the formulated Assessment and Plan as detailed in the residents' note, with the following changes: Nicholas [name] is an 82 year old man who is immunocompromised due to chemotherapy to treat his stage IV non-small cell lung cancer. He has failed outpatient clindamycin treatment for his scrotal and inner thigh cellulitis with  superimposed candidal infection. He has responded well to IV antibiotics including Vanc and Zosyn. As the most likely etiology is a gram-positive cocci staph or strep, we will narrow to vancomycin and continue for another 1-2 days to ensure he continues to show clinical improvement. At that time, we will be able to narrow to oral antibiotics likely doxycycline.  1. Scrotal and inner thigh cellulitis - responding well to IV antibiotics. Narrow coverage to vancomycin. Likely continue for another 1-2 days before converting to oral antibiotics. Would consider doxycycline. Continue nystatin for superimposed yeast infection.  2. Left arm fluctuant lesion - U/S and possible I&D.  3. Hypokalemia - replete  Bartholomew Crews, MD 9/28/20181:59 PM

## 2017-02-02 NOTE — Progress Notes (Signed)
New Admission Note:  Arrival Method: On stretcher from ED. Mental Orientation: Alert & Oriented x2 Telemetry: CCMD verfied Assessment: Completed Skin: Refer to flowsheet IV: Right Forearm Pain: 0/10 Safety Measures: Safety Fall Prevention Plan discussed with patient. Admission: Completed 2 [place] Orientation: Patient has been orientated to the room, unit and the staff. Family: Friend at bedside.   Orders have been reviewed and implemented. Will continue to monitor the patient. Call light has been placed within reach and bed alarm has been activated.   Vassie Moselle, RN  Phone Number: [phone]

## 2017-02-02 NOTE — Consult Note (Signed)
           [facility] CM Primary Care Navigator  02/02/2017  Nicholas [name] 14-Aug-1934 [id number]   Went in to see patient at the bedside to identify possible discharge needs but physician and staff member went in the room to perform ultrasound.  Will meet with patient at another time when he is available in the room.    AddendumMartin Majestic back to see patient in the room after the procedure to identify discharge needs and was able to meet with daughter [name]) as well. Daughter reports that patient is blind. Patient reports having worsening pain, itching and "raw" to his private area that had led to this admission. Patient's daughter endorsesDr. [name] with [facility] as his primary care provider.   Daughter states [facility] [address] to obtain medications without difficulty.   Patient's stepdaughter [name]- lives with patient) has been managing hismedications at home straight out of the containers.  Daughter verbalized that she is providingtransportation to hisdoctors'appointments.  Patient's daughter reports that she is the primary caregiverat home.   Anticipated discharge plan is home according to daughter.   Patientand daughter expressedunderstanding to call primary care provider's officewhen he getshome,for a post discharge follow-up appointment within a week or sooner if needed.Patient letter (with PCP's contact number) was provided as theirreminder.  Explained to patient and daughter about [facility] CM services available for health management at home and daughter denies any current needs or concerns for health management at this time stating that patient is "managing at home so far with help of his daughters".  However,voiced preference and opted for EMMI calls only to follow-up recovery at home over home visits.  Referral made to [place] after discharge.  Daughter mentioned that  patient had been losing weight. Patient is being treated for lung cancer - on chemotherapy. Patient's daughter voicedunderstanding to seekreferral to [facility] care management servicesfrom primary care provider if needed and appropriate in the near future.   [facility] care management information provided for future needs that he may have.  Attempted to call primary care provider's office twice to notify of patient's discharge disposition, need for post hospital follow-up and transition of care, as well as health issues needing follow-up but was unsuccessful. Contact information left with receptionist for call back. Awaiting for return call.   For questions, please contact:  Dannielle Huh, BSN, RN- [facility] Primary Care Navigator  Telephone: [phone] [place]

## 2017-02-02 NOTE — Progress Notes (Addendum)
   Subjective: Patient much more pleasant today. Conversing. States that he is not in any pain aside from some mild neck pain. He says that he does have some mild discomfort in his left elbow. Does not remember any trauma to the area. He does think that he injured his right lateral foot. Stating that he hit it on the refrigerator the other day. His scrotal pain has greatly improved. He has no questions or concerns today. Agrees to stay in the hospital for further antibiotic therapy.   Objective: Vital signs in last 24 hours: Vitals:   02/02/17 0100 02/02/17 0130 02/02/17 0228 02/02/17 0500  BP: (!) 160/94 126/80 (!) 158/117 (!) 147/71  Pulse: 90 80 94 77  Resp:   16 17  Temp:   98.6 F (37 C) 99.1 F (37.3 C)  TempSrc:   Oral Oral  SpO2: 95% (!) 86% 93% 98%  Weight:    228 lb 13.4 oz (103.8 kg)   General: Alert and oriented to person and place. No acute distress Pulm: Good air movement with no wheezing or crackles  CV: RRR, no murmurs, no rubs  GI: Active bowel sounds, soft, non-distended, no tenderness to palpation  GU: Scrotal tenderness, erythema, and swelling improved from prior exam. Purulent drainage not has significant today  Extremities: - Left lateral forearm: Just distal to the elbow there is an area of erythema and swelling, appear to be a fluctuating mass with overlying abrasion - Right lateral foot: Abrasion with surrounding erythema and swelling   Assessment/Plan: Principal Problem:   Cellulitis of scrotum Active Problems:   Chronic diastolic congestive heart failure (HCC)   Cancer of upper lobe of right lung, stage IV adenocarcinoma   CKD (chronic kidney disease), stage II   Scrotal edema  1. Scrotal Cellulitis - Improved from the prior exam. Decreased purulence, tenderness, and swelling  - Continues to have a leukocytosis  - Continue Vanc, discontinue Cefepime  - Will discharge on Doxycycline when appropriate  - Wound care consulted   2. Left Arm Cellulitis    - Fluctuating mass felt, possible abscess  - Bedside ultrasound this evening  - Continue Vanc   3. CKD Stage III - Creatinine at 1.58, consistent with baseline  - Avoid nephrotoxic drugs - Continue to trend creatinine since using Vancomycin   4. HFpEF - Will resume home Lasix - Hold home enalapril while on vanc  - Discontinue fluids and telemetry   5. Electrolytes Abnormalities  - Replete potassium with oral K-Dur  Dispo: Anticipated discharge in approximately 1-2 day(s).   Ina Homes, MD 02/02/2017, 6:14 AM My Pager: [phone]  ADDENDUM: Bedside ultrasound of the left lateral forearm distal to the olecranon process performed with Dr. Evette Doffing. Cobblestoning appearance of the soft tissue with an underlying mass approximately 1 cm deep and 4 cm wide, increased echogenicity that is not compressible with the ultrasound probe. Does not appear to be free flowing fluid at this point. Do not believe I&D would be beneficial at this point. Continue with antibiotics and serial ultrasounds.

## 2017-02-02 NOTE — ED Notes (Signed)
Pt sleeping friend at the bedside

## 2017-02-02 NOTE — Consult Note (Addendum)
[facility] Nurse wound consult note Reason for Consult: Consult requested for scrotum and penis.  WOC visualized yesterday in the ER as requested and Urology consult was subsequently performed; please refer to their notes. [place] team is consulted at this time for topical treatment orders. Wound type: Scrotum with generalized edema; family member at bedside yesterday states he is constantly wet from urine leaking on this location. Patchy areas of full thickness skin loss to scrotum; red moist wounds, each approx .2X.2X.2cm or smaller, small amt yellow drainage, some odor, painful to touch.  Inner groin with partial thickness skin loss consistent with moisture associated skin damage.   Dressing procedure/placement/frequency: Barrier cream and antifungal powder to groin and buttocks to repel moisture and promote healing.  Xeroform gauze to scrotum and elevate to promote drying and healing. Air mattress to increase airflow. Please re-consult if further assistance is needed.  Thank-you,  Julien Girt MSN, [place], [place], [place], [place]

## 2017-02-02 NOTE — Progress Notes (Signed)
Have ordered mattress for pt.

## 2017-02-03 LAB — CBC
HCT: 29.6 % — ABNORMAL LOW (ref 39.0–52.0)
Hemoglobin: 8.7 g/dL — ABNORMAL LOW (ref 13.0–17.0)
MCH: 26 pg (ref 26.0–34.0)
MCHC: 29.4 g/dL — AB (ref 30.0–36.0)
MCV: 88.6 fL (ref 78.0–100.0)
Platelets: 203 10*3/uL (ref 150–400)
RBC: 3.34 MIL/uL — ABNORMAL LOW (ref 4.22–5.81)
RDW: 21.8 % — AB (ref 11.5–15.5)
WBC: 9.8 10*3/uL (ref 4.0–10.5)

## 2017-02-03 LAB — BASIC METABOLIC PANEL
Anion gap: 7 (ref 5–15)
BUN: 20 mg/dL (ref 6–20)
CALCIUM: 8 mg/dL — AB (ref 8.9–10.3)
CO2: 28 mmol/L (ref 22–32)
Chloride: 105 mmol/L (ref 101–111)
Creatinine, Ser: 1.39 mg/dL — ABNORMAL HIGH (ref 0.61–1.24)
GFR calc Af Amer: 54 mL/min — ABNORMAL LOW (ref >60)
GFR calc non Af Amer: 46 mL/min — ABNORMAL LOW (ref >60)
GLUCOSE: 105 mg/dL — AB (ref 65–99)
Potassium: 3.4 mmol/L — ABNORMAL LOW (ref 3.5–5.1)
SODIUM: 140 mmol/L (ref 135–145)

## 2017-02-03 MED ORDER — ENALAPRIL MALEATE 5 MG PO TABS
40.0000 mg | ORAL_TABLET | Freq: Every day | ORAL | Status: DC
  Administered 2017-02-03 – 2017-02-04 (×2): 40 mg via ORAL
  Filled 2017-02-03 (×2): qty 8

## 2017-02-03 MED ORDER — DOXYCYCLINE HYCLATE 100 MG PO TABS
100.0000 mg | ORAL_TABLET | Freq: Two times a day (BID) | ORAL | Status: DC
  Administered 2017-02-03 – 2017-02-04 (×2): 100 mg via ORAL
  Filled 2017-02-03 (×2): qty 1

## 2017-02-03 MED ORDER — POTASSIUM CHLORIDE CRYS ER 10 MEQ PO TBCR
30.0000 meq | EXTENDED_RELEASE_TABLET | Freq: Once | ORAL | Status: AC
  Administered 2017-02-03: 30 meq via ORAL
  Filled 2017-02-03: qty 1

## 2017-02-03 NOTE — Progress Notes (Addendum)
   Subjective:   No acute events overnight. Eating breakfast comfortably. Says scrotal pain much less, and left arm pain improved.    Objective: Vital signs in last 24 hours: Vitals:   02/02/17 1900 02/02/17 2055 02/03/17 0455 02/03/17 0700  BP:  125/74  (!) 180/75  Pulse:  85  91  Resp:  19  20  Temp:  98.3 F (36.8 C)  98.1 F (36.7 C)  TempSrc:  Oral  Oral  SpO2:  94%  98%  Weight:  229 lb (103.9 kg) 229 lb 0.9 oz (103.9 kg)   Height: 5\' 11"  (1.803 m)      General: Alert and oriented to person and place. No acute distress Pulm: Good air movement with no wheezing or crackles  CV: RRR, no murmurs, no rubs  GI: Active bowel sounds, soft, non-distended, no tenderness to palpation  GU: Scrotal tenderness, erythema, and swelling improved from prior exam. No purulent drainage. Left arm: has an area of swelling and tenderness.   Assessment/Plan: Principal Problem:   Cellulitis of scrotum Active Problems:   Chronic diastolic congestive heart failure (HCC)   Cancer of upper lobe of right lung, stage IV adenocarcinoma   CKD (chronic kidney disease), stage II   Scrotal edema  1. Scrotal Cellulitis:  Improved from the prior exam. Decreased tenderness, and swelling.  Leukocytosis resolved. - switch from vanc to doxy today - Wound care consulted   2. Left Arm Cellulitis : Bedside ultrasound shows Cobblestoning appearance of the soft tissue with an underlying mass approximately 1 cm deep and 4 cm wide, increased echogenicity that is not compressible with the ultrasound probe. Does not appear to be free flowing fluid at this point. Do not believe I&D would be beneficial at this point.  - vanc--> doxy will cover for possible cellulitis   3. CKD Stage III: stable    4. HFpEF - Will resume home Lasix - resume enalapril today   5. Hypokalemia  - Replete potassium with oral K-Dur  6 HTN -resume enalapril   Dispo: Anticipated discharge in approximately 1-2 day(s).   Burgess Estelle, MD 02/03/2017, 9:55 AM

## 2017-02-04 DIAGNOSIS — I5032 Chronic diastolic (congestive) heart failure: Secondary | ICD-10-CM

## 2017-02-04 DIAGNOSIS — N182 Chronic kidney disease, stage 2 (mild): Secondary | ICD-10-CM

## 2017-02-04 DIAGNOSIS — N5082 Scrotal pain: Secondary | ICD-10-CM

## 2017-02-04 DIAGNOSIS — C3411 Malignant neoplasm of upper lobe, right bronchus or lung: Secondary | ICD-10-CM

## 2017-02-04 LAB — CULTURE, BLOOD (ROUTINE X 2)
Culture: NO GROWTH
Culture: NO GROWTH
SPECIAL REQUESTS: ADEQUATE
Special Requests: ADEQUATE

## 2017-02-04 LAB — GLUCOSE, CAPILLARY: Glucose-Capillary: 87 mg/dL (ref 65–99)

## 2017-02-04 MED ORDER — DOXYCYCLINE MONOHYDRATE 100 MG PO CAPS: 100.0000 mg | ORAL_CAPSULE | Freq: Two times a day (BID) | ORAL | 0 refills | Status: AC

## 2017-02-04 MED ORDER — ADULT MULTIVITAMIN W/MINERALS CH: 1.0000 | ORAL_TABLET | Freq: Every day | ORAL | 3 refills | Status: AC

## 2017-02-04 MED ORDER — NYSTATIN 100000 UNIT/GM EX POWD: Freq: Four times a day (QID) | CUTANEOUS | 2 refills | Status: DC

## 2017-02-04 NOTE — Progress Notes (Signed)
Discharge instructions,medications/prescriptionsandfollow up appointments discussed and reviewed with pt and daughter at bedside,verbalized understanding.IV dc'ed,site clean and dry. Pt escorted out of the unit in wheelchair, took all belongings with him.

## 2017-02-04 NOTE — Progress Notes (Signed)
  Date: 02/04/2017  Patient name: Nicholas [name]  Medical record number: [id number]  Date of birth: [birth year]   I have seen and evaluated this patient and I have discussed the plan of care with the house staff. Please see their note for complete details. I concur with their findings.  Bartholomew Crews, MD 02/04/2017, 2:10 PM

## 2017-02-04 NOTE — Discharge Summary (Signed)
Name: Nicholas [name] MRN: [id number] DOB: 1934-09-07 82 y.o. PCP: [name], MD  Date of Admission: 02/01/2017 12:41 PM Date of Discharge:02/04/2017 Attending Physician: Larey Dresser  Discharge Diagnosis: 1. Cellulitis Principal Problem:   Cellulitis of scrotum Active Problems:   Chronic diastolic congestive heart failure (HCC)   Cancer of upper lobe of right lung, stage IV adenocarcinoma   CKD (chronic kidney disease), stage II   Scrotal edema   Discharge Medications: Allergies as of 02/04/2017      Reactions   Amlodipine Swelling   Swelling of legs. (Patient unsure if he is actually allergic to Amlodipine)      Medication List    STOP taking these medications   clindamycin 300 MG capsule Commonly known as:  CLEOCIN   HYDROcodone-acetaminophen 10-325 MG tablet Commonly known as:  NORCO   nystatin 100000 UNIT/ML suspension Commonly known as:  MYCOSTATIN   prochlorperazine 10 MG tablet Commonly known as:  COMPAZINE     TAKE these medications   calcipotriene 0.005 % cream Commonly known as:  DOVONOX Apply 1 application topically 2 (two) times daily.   carvedilol 25 MG tablet Commonly known as:  COREG Take 1 tablet (25 mg total) by mouth 2 (two) times daily with a meal.   clobetasol ointment 0.05 % Commonly known as:  TEMOVATE Apply 1 application topically 2 (two) times daily.   [employer] VITAMIN D3 1000 units capsule Generic drug:  Cholecalciferol TAKE 1,000 UNITS BY MOUTH DAILY.   dexamethasone 4 MG tablet Commonly known as:  DECADRON 4 mg by mouth twice a day the day before, day of and day after chemotherapy every 3 weeks   diclofenac sodium 1 % Gel Commonly known as:  VOLTAREN APPLY TO AFFECTED AREA 3 TIMES A DAY FOR JOINT PAIN AS NEEDED   doxycycline 100 MG capsule Commonly known as:  MONODOX Take 1 capsule (100 mg total) by mouth 2 (two) times daily.   enalapril 20 MG tablet Commonly known as:  VASOTEC Take 2 tablets (40 mg total) by  mouth daily.   folic acid 1 MG tablet Commonly known as:  FOLVITE Take 1 tablet (1 mg total) by mouth daily.   furosemide 20 MG tablet Commonly known as:  LASIX Take 1 tablet (20 mg total) by mouth daily.   gabapentin 600 MG tablet Commonly known as:  NEURONTIN Take 1 tablet (600 mg total) by mouth 2 (two) times daily.   INTEGRA PLUS Caps Take 1 capsule by mouth daily.   Lumbar Back Brace/Support Pad Misc 1 each by Does not apply route as needed.   multivitamin with minerals Tabs tablet Take 1 tablet by mouth daily.   nystatin powder Commonly known as:  MYCOSTATIN/NYSTOP Apply topically 4 (four) times daily.   oxyCODONE-acetaminophen 10-325 MG tablet Commonly known as:  PERCOCET Take 1 tablet by mouth every 8 (eight) hours as needed for pain. Start taking on:  02/25/2017   OXYGEN Inhale 2 L into the lungs daily as needed.   pantoprazole 40 MG tablet Commonly known as:  PROTONIX TAKE 1 TABLET (20 MG TOTAL) BY MOUTH DAILY.   potassium chloride SA 20 MEQ tablet Commonly known as:  K-DUR,KLOR-CON Take 1 tablet (20 mEq total) by mouth 2 (two) times daily.   rosuvastatin 10 MG tablet Commonly known as:  CRESTOR Take 1 tablet (10 mg total) by mouth daily.   sucralfate 1 g tablet Commonly known as:  CARAFATE TAKE 1 TABLET (1 G TOTAL) BY MOUTH 2 (TWO) TIMES DAILY.  VENTOLIN HFA 108 (90 Base) MCG/ACT inhaler Generic drug:  albuterol INHALE 2 PUFFS EVERY 6 HOURS AS NEEDED FOR WHEEZING/SHORTNESS OF BREATH       Disposition and follow-up:   [name] was discharged from [facility] in Stable condition.  At the hospital follow up visit please address:  1.   Scrotal cellulitis: Is he having continued resolution of his cellulitis? Is he using nystatin powder regularly? Has he been taking doxycycline? (End date 10/11)  Arm cellulitis: Any change in appearance? Any drainage? Compliance with doxycycline as above  2.  Labs / imaging  needed at time of follow-up: None  3.  Pending labs/ test needing follow-up: Blood culture 02/01/17 NGTD x3 days  Follow-up Appointments: [facility] clinic to call and schedule appointment.  Hospital Course by problem list: Principal Problem:   Cellulitis of scrotum Active Problems:   Chronic diastolic congestive heart failure (HCC)   Cancer of upper lobe of right lung, stage IV adenocarcinoma   CKD (chronic kidney disease), stage II   Scrotal edema   Scrotal cellulitis: Patient with h/o lung cancer currently undergoing chemotherapy, admitted on 02/01/2017 for worsening scrotal cellulitis associated with tenderness, swelling and purulent drainage. Patient initially seen in ED on 9/25 and attempted outpatient treatment with clindamycin with no improvement in his symptoms. CT abd/pelvis was performed and did not reveal abscess or evidence of soft-tissue emphysema that would be concerning for necrotizing fasciitis. Urology was consulted in ED and recommended only admission with IV antibiotics but no need for debridement at this time. He was initially started on Vanc/Zosyn then narrowed to Vancomycin (total IV abx x3days) in addition to nystatin cream/powder; he continued to have improvement and was switched to oral doxycycline prior to discharge, with continued application of nystatin powder. He will complete total 2 week course of antibiotics (end date 02/15/17).  Arm cellulitis: Patient also had area of cellulitis on his left arm which was suspicious for an abscess; however bedside ultrasound was performed and showed 1cmx4cm increased echogenicity which was not compressible and without apparent free-flowing fluid - performing physicians did not find it amenable to I&D at that time and continued antibiotics as discussed above.  AKI on CKD stage 3/hypokalemia: Patient had elevation of Cr to 1.9 from his baseline of 1.2-4 on admission likely 2/2 decreased PO intake in setting of his infection. His renal  function improved to baseline with gentle fluids and treatment of the underlying infection. His enalapril was initially held but resumed day prior to discharge. His K was supplemented.   HTN:  Enapril and lasix initially held in setting of AKI, however resumed on resolution. He was continued on carvedilol 25mg  BID.  Discharge Vitals:   BP (!) 185/123 (BP Location: Left Arm)   Pulse 100   Temp 98.3 F (36.8 C)   Resp 19   Ht 5\' 11"  (1.803 m)   Wt 230 lb (104.3 kg)   SpO2 100%   BMI 32.08 kg/m   Pertinent Labs, Studies, and Procedures:  CBC    Component Value Date/Time   WBC 9.8 02/03/2017 0330   RBC 3.34 (L) 02/03/2017 0330   HGB 8.7 (L) 02/03/2017 0330   HGB 9.9 (L) 01/25/2017 1043   HCT 29.6 (L) 02/03/2017 0330   HCT 31.4 (L) 01/25/2017 1043   PLT 203 02/03/2017 0330   PLT 190 01/25/2017 1043   MCV 88.6 02/03/2017 0330   MCV 83.5 01/25/2017 1043   MCH 26.0 02/03/2017 0330   MCHC 29.4 (  L) 02/03/2017 0330   RDW 21.8 (H) 02/03/2017 0330   RDW 24.4 (H) 01/25/2017 1043   LYMPHSABS 1.1 02/01/2017 1205   LYMPHSABS 0.8 (L) 01/25/2017 1043   MONOABS 1.7 (H) 02/01/2017 1205   MONOABS 1.3 (H) 01/25/2017 1043   EOSABS 0.1 02/01/2017 1205   EOSABS 0.0 01/25/2017 1043   BASOSABS 0.0 02/01/2017 1205   BASOSABS 0.0 01/25/2017 1043   BMET    Component Value Date/Time   NA 140 02/03/2017 0330   NA 142 01/25/2017 1043   K 3.4 (L) 02/03/2017 0330   K 3.5 01/25/2017 1043   CL 105 02/03/2017 0330   CO2 28 02/03/2017 0330   CO2 26 01/25/2017 1043   GLUCOSE 105 (H) 02/03/2017 0330   GLUCOSE 194 (H) 01/25/2017 1043   BUN 20 02/03/2017 0330   BUN 14.7 01/25/2017 1043   CREATININE 1.39 (H) 02/03/2017 0330   CREATININE 1.2 01/25/2017 1043   CALCIUM 8.0 (L) 02/03/2017 0330   CALCIUM 9.1 01/25/2017 1043   GFRNONAA 46 (L) 02/03/2017 0330   GFRNONAA 68 04/17/2014 1006   GFRAA 54 (L) 02/03/2017 0330   GFRAA 79 04/17/2014 1006   Blood cultures 02/01/17: NGTD x 3 days  Discharge  Instructions: Discharge Instructions    (HEART FAILURE PATIENTS) Call MD:  Anytime you have any of the following symptoms: 1) 3 pound weight gain in 24 hours or 5 pounds in 1 week 2) shortness of breath, with or without a dry hacking cough 3) swelling in the hands, feet or stomach 4) if you have to sleep on extra pillows at night in order to breathe.    Complete by:  As directed    Call MD for:  difficulty breathing, headache or visual disturbances    Complete by:  As directed    Call MD for:  extreme fatigue    Complete by:  As directed    Call MD for:  hives    Complete by:  As directed    Call MD for:  persistant dizziness or light-headedness    Complete by:  As directed    Call MD for:  persistant nausea and vomiting    Complete by:  As directed    Call MD for:  redness, tenderness, or signs of infection (pain, swelling, redness, odor or green/yellow discharge around incision site)    Complete by:  As directed    Call MD for:  severe uncontrolled pain    Complete by:  As directed    Call MD for:  temperature >100.4    Complete by:  As directed    Diet - low sodium heart healthy    Complete by:  As directed    Discharge instructions    Complete by:  As directed    Take doxycycline 100mg  (one tab) twice a day for 11 days (until you run out). Place nystatin powder on your private area 3-4 times a day to help promote healing and keep the are dry.  Continue your other medications as previously.  Our clinic will call you on Monday to schedule a follow up appointment with the [facility]. If you don't hear from them by Tuesday, please call and schedule the appointment in about 1 week.   Increase activity slowly    Complete by:  As directed       Signed: Alphonzo Grieve, MD 02/05/2017, 10:37 AM   Pager [phone]

## 2017-02-04 NOTE — Progress Notes (Addendum)
   Subjective:   No acute events overnight. Patient endorses significant improvement in comfort level and feels ready to continue treatment at home. He lives with his daughter and grandchildren; he also has two other children who live close by and check on him often. We spoke to his daughter [name] and she is in agreement with plan for discharge; she states that he does not require assistance at home to ambulate and is relatively independent though he does have support if he needs it.  Objective: Vital signs in last 24 hours: Vitals:   02/03/17 1722 02/03/17 2116 02/04/17 0632 02/04/17 0715  BP: (!) 172/90 (!) 146/92 (!) 167/94 (!) 185/123  Pulse: 89 93 84 100  Resp: 16 18 18 19   Temp: 98.7 F (37.1 C) 98.9 F (37.2 C) 98.6 F (37 C) 98.3 F (36.8 C)  TempSrc: Oral Oral Oral   SpO2: 100% 96% 100% 100%  Weight:  230 lb (104.3 kg)    Height:       Constitutional: NAD, sleeping but easily woken up CV: RRR, no murmurs, rubs or gallops Resp: equal breath sounds throughout, mildly coarse breath sounds throughout, no rales GU: improving scrotal and upper thigh cellulitis, no drainage, less tenderness present MSK: left forearm cellulitis - still with some fluctuance and weeping though per report not changed significantly since yesterday  Assessment/Plan: Principal Problem:   Cellulitis of scrotum Active Problems:   Chronic diastolic congestive heart failure (HCC)   Cancer of upper lobe of right lung, stage IV adenocarcinoma   CKD (chronic kidney disease), stage II   Scrotal edema  1. Scrotal Cellulitis: Continued improvement. -vanc x 3 days, then switched to doxy -nystatin  2. Left Arm Cellulitis : Bedside ultrasound by previous resident shows cobblestoning appearance of the soft tissue with an underlying mass approximately 1 cm deep and 4 cm wide, increased echogenicity that is not compressible with the ultrasound probe. Does not appear to be free flowing fluid at this point. Do  not believe I&D would be beneficial at this point; appears stable today. -vanc x3 days --> doxy will cover for possible cellulitis   3. CKD Stage III: stable   4. HFpEF -lasix 20mg  daily -enalapril 40mg  daily, and carvedilol 25mg  BID   5. Hypokalemia  -given oral Kdur yesterday  6 HTN -enalapril 40mg  daily -carvedilol 25mg  BID -lasix 20mg  daily  Dispo: Anticipated discharge today; send message to [facility] front desk to call pt and schedule f/u appt.   Alphonzo Grieve, MD 02/04/2017, 10:54 AM

## 2017-02-06 ENCOUNTER — Other Ambulatory Visit: Payer: Self-pay | Admitting: Internal Medicine

## 2017-02-06 LAB — CULTURE, BLOOD (ROUTINE X 2)
CULTURE: NO GROWTH
Culture: NO GROWTH
Special Requests: ADEQUATE
Special Requests: ADEQUATE

## 2017-02-07 ENCOUNTER — Ambulatory Visit: Payer: Medicare HMO | Admitting: Internal Medicine

## 2017-02-07 ENCOUNTER — Encounter: Payer: Self-pay | Admitting: Internal Medicine

## 2017-02-07 ENCOUNTER — Ambulatory Visit (INDEPENDENT_AMBULATORY_CARE_PROVIDER_SITE_OTHER): Payer: Medicare HMO | Admitting: Internal Medicine

## 2017-02-07 ENCOUNTER — Other Ambulatory Visit: Payer: Medicare HMO

## 2017-02-07 ENCOUNTER — Ambulatory Visit: Payer: Medicare HMO

## 2017-02-07 VITALS — BP 166/86 | HR 85 | Temp 98.2°F | Ht 71.0 in | Wt 231.5 lb

## 2017-02-07 DIAGNOSIS — N492 Inflammatory disorders of scrotum: Secondary | ICD-10-CM

## 2017-02-07 DIAGNOSIS — I10 Essential (primary) hypertension: Secondary | ICD-10-CM | POA: Diagnosis not present

## 2017-02-07 DIAGNOSIS — M150 Primary generalized (osteo)arthritis: Secondary | ICD-10-CM | POA: Diagnosis not present

## 2017-02-07 DIAGNOSIS — M159 Polyosteoarthritis, unspecified: Secondary | ICD-10-CM

## 2017-02-07 MED ORDER — HYDROCODONE-ACETAMINOPHEN 10-325 MG PO TABS: 1.0000 | ORAL_TABLET | Freq: Four times a day (QID) | ORAL | 0 refills | Status: DC | PRN

## 2017-02-07 MED ORDER — SUCRALFATE 1 G PO TABS: 1.0000 g | ORAL_TABLET | Freq: Two times a day (BID) | ORAL | 1 refills | Status: AC

## 2017-02-07 MED ORDER — ENALAPRIL MALEATE 20 MG PO TABS: 40.0000 mg | ORAL_TABLET | Freq: Every day | ORAL | 2 refills | Status: AC

## 2017-02-07 MED ORDER — FUROSEMIDE 20 MG PO TABS: 20.0000 mg | ORAL_TABLET | Freq: Every day | ORAL | 2 refills | Status: AC

## 2017-02-07 MED ORDER — CALCIPOTRIENE 0.005 % EX CREA: 1.0000 "application " | TOPICAL_CREAM | Freq: Two times a day (BID) | CUTANEOUS | 1 refills | Status: AC

## 2017-02-07 MED ORDER — NYSTATIN 100000 UNIT/GM EX POWD: Freq: Four times a day (QID) | CUTANEOUS | 2 refills | Status: AC

## 2017-02-07 MED ORDER — ROSUVASTATIN CALCIUM 10 MG PO TABS: 10.0000 mg | ORAL_TABLET | Freq: Every day | ORAL | 3 refills | Status: AC

## 2017-02-07 MED ORDER — CARVEDILOL 25 MG PO TABS: 25.0000 mg | ORAL_TABLET | Freq: Two times a day (BID) | ORAL | 2 refills | Status: AC

## 2017-02-07 MED ORDER — PANTOPRAZOLE SODIUM 40 MG PO TBEC: DELAYED_RELEASE_TABLET | ORAL | 2 refills | Status: AC

## 2017-02-07 MED ORDER — GABAPENTIN 600 MG PO TABS: 600.0000 mg | ORAL_TABLET | Freq: Two times a day (BID) | ORAL | 3 refills | Status: AC
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
## 2017-02-07 NOTE — Assessment & Plan Note (Signed)
Patient is currently on doxycycline until October 11. He is compliant with it and has not missed any doses. On exam, his cellulitis has greatly improved . Denies systemic symptoms. It has responded well to the abx.  He also had arm cellulitis, which was ultrasounded in the hospital but I&D could not be done as not much drainage there. Today, it seems to have opened and there was purulent drainage- no further drainage could be brought from there.  Plan -finish doxycycline -continue nystatin powder -return precautions given if it worsens

## 2017-02-07 NOTE — Patient Instructions (Addendum)
  Thank you for your visit today Please finish your antibiotics. Please change your dressing of the arm in 2-3 days- clean the area with saline, let it dry, then put a gauze over it so the tissue does not break. If you see that the wound continues to drain when you finish the antibiotics, then please return to the clinic for further evaluation   Please make appointment on oct 17th to see your PCP

## 2017-02-07 NOTE — Assessment & Plan Note (Signed)
Patient is on chronic opiate therapy. He was recently switched to percoset (oxycodone-acet) , however he would like to go back to hydrocodone. We explained that he was given 2 prescriptions of the oxycodone-percoset, and since 1 is unfilled, he needs to bring it back before we can dispense the hydrocodone. We reviewed database and shows appropriate dispensing.   Plan -I have left the hydrocodone-acetaminophen with Ms Bonnita Nasuti to swap

## 2017-02-07 NOTE — Assessment & Plan Note (Signed)
BP Readings from Last 3 Encounters:  02/07/17 (!) 166/86  02/04/17 (!) 185/123  01/30/17 (!) 158/106  Patient's family says that his blood pressure today is the best it has ever been. He is compliant with his lasix, carvedilol, and enalapril.   Plan -continue current regimen -follow up in 2-4 weeks with PCP

## 2017-02-08 ENCOUNTER — Telehealth: Payer: Self-pay

## 2017-02-08 NOTE — Telephone Encounter (Signed)
Faxed [employer] oxygen form for [facility] 10/3/25018.

## 2017-02-09 ENCOUNTER — Encounter (HOSPITAL_COMMUNITY): Payer: Self-pay

## 2017-02-09 ENCOUNTER — Ambulatory Visit (HOSPITAL_COMMUNITY)
Admission: RE | Admit: 2017-02-09 | Discharge: 2017-02-09 | Disposition: A | Discharge status: Home / Self Care | Payer: Medicare HMO | Source: Ambulatory Visit | Attending: Oncology | Admitting: Oncology

## 2017-02-09 ENCOUNTER — Other Ambulatory Visit (HOSPITAL_BASED_OUTPATIENT_CLINIC_OR_DEPARTMENT_OTHER): Payer: Medicare HMO

## 2017-02-09 ENCOUNTER — Other Ambulatory Visit: Payer: Medicare HMO

## 2017-02-09 DIAGNOSIS — C3490 Malignant neoplasm of unspecified part of unspecified bronchus or lung: Secondary | ICD-10-CM | POA: Diagnosis not present

## 2017-02-09 DIAGNOSIS — I700 Atherosclerosis of aorta: Secondary | ICD-10-CM | POA: Insufficient documentation

## 2017-02-09 DIAGNOSIS — C3411 Malignant neoplasm of upper lobe, right bronchus or lung: Secondary | ICD-10-CM | POA: Diagnosis not present

## 2017-02-09 DIAGNOSIS — I2510 Atherosclerotic heart disease of native coronary artery without angina pectoris: Secondary | ICD-10-CM | POA: Insufficient documentation

## 2017-02-09 DIAGNOSIS — R590 Localized enlarged lymph nodes: Secondary | ICD-10-CM | POA: Insufficient documentation

## 2017-02-09 LAB — CBC WITH DIFFERENTIAL/PLATELET
BASO%: 0.5 % (ref 0.0–2.0)
Basophils Absolute: 0 10*3/uL (ref 0.0–0.1)
EOS ABS: 0.2 10*3/uL (ref 0.0–0.5)
EOS%: 3.3 % (ref 0.0–7.0)
HCT: 27.9 % — ABNORMAL LOW (ref 38.4–49.9)
HEMOGLOBIN: 8.5 g/dL — AB (ref 13.0–17.1)
LYMPH%: 20.4 % (ref 14.0–49.0)
MCH: 26.9 pg — ABNORMAL LOW (ref 27.2–33.4)
MCHC: 30.5 g/dL — ABNORMAL LOW (ref 32.0–36.0)
MCV: 88.3 fL (ref 79.3–98.0)
MONO#: 1.2 10*3/uL — AB (ref 0.1–0.9)
MONO%: 20.2 % — ABNORMAL HIGH (ref 0.0–14.0)
NEUT%: 55.6 % (ref 39.0–75.0)
NEUTROS ABS: 3.4 10*3/uL (ref 1.5–6.5)
PLATELETS: 238 10*3/uL (ref 140–400)
RBC: 3.16 10*6/uL — AB (ref 4.20–5.82)
RDW: 20.9 % — ABNORMAL HIGH (ref 11.0–14.6)
WBC: 6.1 10*3/uL (ref 4.0–10.3)
lymph#: 1.3 10*3/uL (ref 0.9–3.3)

## 2017-02-09 LAB — COMPREHENSIVE METABOLIC PANEL
ALBUMIN: 2.4 g/dL — AB (ref 3.5–5.0)
ALK PHOS: 64 U/L (ref 40–150)
ALT: 15 U/L (ref 0–55)
ANION GAP: 9 meq/L (ref 3–11)
AST: 20 U/L (ref 5–34)
BILIRUBIN TOTAL: 0.37 mg/dL (ref 0.20–1.20)
BUN: 14.8 mg/dL (ref 7.0–26.0)
CO2: 30 meq/L — AB (ref 22–29)
CREATININE: 1.2 mg/dL (ref 0.7–1.3)
Calcium: 9.1 mg/dL (ref 8.4–10.4)
Chloride: 103 mEq/L (ref 98–109)
EGFR: 67 mL/min/{1.73_m2} — ABNORMAL LOW (ref >90)
GLUCOSE: 99 mg/dL (ref 70–140)
Potassium: 3.6 mEq/L (ref 3.5–5.1)
Sodium: 143 mEq/L (ref 136–145)
TOTAL PROTEIN: 6.6 g/dL (ref 6.4–8.3)

## 2017-02-09 LAB — UA PROTEIN, DIPSTICK - CHCC: Protein, ur: 30 mg/dL

## 2017-02-09 MED ORDER — IOPAMIDOL (ISOVUE-300) INJECTION 61%
75.0000 mL | Freq: Once | INTRAVENOUS | Status: AC | PRN
  Administered 2017-02-09: 75 mL via INTRAVENOUS

## 2017-02-09 MED ORDER — IOPAMIDOL (ISOVUE-300) INJECTION 61%
INTRAVENOUS | Status: AC
  Administered 2017-02-09: 75 mL via INTRAVENOUS
  Filled 2017-02-09: qty 75

## 2017-02-12 NOTE — Progress Notes (Signed)
[facility] Medicine Clinic Attending  I saw and evaluated the patient.  I personally confirmed the key portions of the history and exam documented by Dr. Saraiya and I reviewed pertinent patient test results.  The assessment, diagnosis, and plan were formulated together and I agree with the documentation in the resident's note.  

## 2017-02-13 ENCOUNTER — Other Ambulatory Visit: Payer: Self-pay | Admitting: Radiology

## 2017-02-14 ENCOUNTER — Other Ambulatory Visit: Payer: Self-pay | Admitting: Radiology

## 2017-02-15 ENCOUNTER — Other Ambulatory Visit: Payer: Self-pay | Admitting: Internal Medicine

## 2017-02-15 ENCOUNTER — Ambulatory Visit (HOSPITAL_COMMUNITY)
Admission: RE | Admit: 2017-02-15 | Discharge: 2017-02-15 | Disposition: A | Discharge status: Home / Self Care | Payer: Medicare HMO | Source: Ambulatory Visit | Attending: Internal Medicine | Admitting: Internal Medicine

## 2017-02-15 ENCOUNTER — Encounter (HOSPITAL_COMMUNITY): Payer: Self-pay

## 2017-02-15 DIAGNOSIS — I5032 Chronic diastolic (congestive) heart failure: Secondary | ICD-10-CM | POA: Diagnosis not present

## 2017-02-15 DIAGNOSIS — C3490 Malignant neoplasm of unspecified part of unspecified bronchus or lung: Secondary | ICD-10-CM | POA: Insufficient documentation

## 2017-02-15 DIAGNOSIS — J449 Chronic obstructive pulmonary disease, unspecified: Secondary | ICD-10-CM | POA: Insufficient documentation

## 2017-02-15 DIAGNOSIS — K219 Gastro-esophageal reflux disease without esophagitis: Secondary | ICD-10-CM | POA: Insufficient documentation

## 2017-02-15 DIAGNOSIS — H548 Legal blindness, as defined in USA: Secondary | ICD-10-CM | POA: Insufficient documentation

## 2017-02-15 DIAGNOSIS — C3411 Malignant neoplasm of upper lobe, right bronchus or lung: Secondary | ICD-10-CM

## 2017-02-15 DIAGNOSIS — I872 Venous insufficiency (chronic) (peripheral): Secondary | ICD-10-CM | POA: Insufficient documentation

## 2017-02-15 DIAGNOSIS — M109 Gout, unspecified: Secondary | ICD-10-CM | POA: Diagnosis not present

## 2017-02-15 DIAGNOSIS — E785 Hyperlipidemia, unspecified: Secondary | ICD-10-CM | POA: Diagnosis not present

## 2017-02-15 DIAGNOSIS — F329 Major depressive disorder, single episode, unspecified: Secondary | ICD-10-CM | POA: Insufficient documentation

## 2017-02-15 DIAGNOSIS — G8929 Other chronic pain: Secondary | ICD-10-CM | POA: Diagnosis not present

## 2017-02-15 DIAGNOSIS — Z5111 Encounter for antineoplastic chemotherapy: Secondary | ICD-10-CM | POA: Diagnosis not present

## 2017-02-15 DIAGNOSIS — M48061 Spinal stenosis, lumbar region without neurogenic claudication: Secondary | ICD-10-CM | POA: Insufficient documentation

## 2017-02-15 DIAGNOSIS — Z87891 Personal history of nicotine dependence: Secondary | ICD-10-CM | POA: Diagnosis not present

## 2017-02-15 DIAGNOSIS — I110 Hypertensive heart disease with heart failure: Secondary | ICD-10-CM | POA: Insufficient documentation

## 2017-02-15 HISTORY — PX: IR FLUORO GUIDE PORT INSERTION RIGHT: IMG5741

## 2017-02-15 HISTORY — PX: IR US GUIDE VASC ACCESS RIGHT: IMG2390

## 2017-02-15 LAB — CBC WITH DIFFERENTIAL/PLATELET
BASOS PCT: 0 %
Basophils Absolute: 0 10*3/uL (ref 0.0–0.1)
EOS ABS: 0.1 10*3/uL (ref 0.0–0.7)
Eosinophils Relative: 1 %
HCT: 29.5 % — ABNORMAL LOW (ref 39.0–52.0)
HEMOGLOBIN: 9 g/dL — AB (ref 13.0–17.0)
Lymphocytes Relative: 23 %
Lymphs Abs: 1.2 10*3/uL (ref 0.7–4.0)
MCH: 27.1 pg (ref 26.0–34.0)
MCHC: 30.5 g/dL (ref 30.0–36.0)
MCV: 88.9 fL (ref 78.0–100.0)
Monocytes Absolute: 0.5 10*3/uL (ref 0.1–1.0)
Monocytes Relative: 10 %
NEUTROS ABS: 3.4 10*3/uL (ref 1.7–7.7)
NEUTROS PCT: 66 %
PLATELETS: 282 10*3/uL (ref 150–400)
RBC: 3.32 MIL/uL — AB (ref 4.22–5.81)
RDW: 20.9 % — AB (ref 11.5–15.5)
WBC: 5.2 10*3/uL (ref 4.0–10.5)

## 2017-02-15 LAB — PROTIME-INR
INR: 1.06
Prothrombin Time: 13.7 seconds (ref 11.4–15.2)

## 2017-02-15 MED ORDER — MIDAZOLAM HCL 2 MG/2ML IJ SOLN
INTRAMUSCULAR | Status: AC
  Filled 2017-02-15: qty 6

## 2017-02-15 MED ORDER — HEPARIN SOD (PORK) LOCK FLUSH 100 UNIT/ML IV SOLN
INTRAVENOUS | Status: AC
  Filled 2017-02-15: qty 5

## 2017-02-15 MED ORDER — FENTANYL CITRATE (PF) 100 MCG/2ML IJ SOLN
INTRAMUSCULAR | Status: AC
  Filled 2017-02-15: qty 2

## 2017-02-15 MED ORDER — FENTANYL CITRATE (PF) 100 MCG/2ML IJ SOLN
INTRAMUSCULAR | Status: AC | PRN
  Administered 2017-02-15: 25 ug via INTRAVENOUS
  Administered 2017-02-15: 50 ug via INTRAVENOUS

## 2017-02-15 MED ORDER — HEPARIN SOD (PORK) LOCK FLUSH 100 UNIT/ML IV SOLN
INTRAVENOUS | Status: AC | PRN
  Administered 2017-02-15: 500 [IU]

## 2017-02-15 MED ORDER — LIDOCAINE-EPINEPHRINE (PF) 2 %-1:200000 IJ SOLN
INTRAMUSCULAR | Status: AC
  Filled 2017-02-15: qty 20

## 2017-02-15 MED ORDER — MIDAZOLAM HCL 2 MG/2ML IJ SOLN
INTRAMUSCULAR | Status: AC | PRN
  Administered 2017-02-15: 1 mg via INTRAVENOUS
  Administered 2017-02-15: 0.5 mg via INTRAVENOUS
  Administered 2017-02-15: 1 mg via INTRAVENOUS
  Administered 2017-02-15: 0.5 mg via INTRAVENOUS

## 2017-02-15 MED ORDER — CEFAZOLIN SODIUM-DEXTROSE 2-4 GM/100ML-% IV SOLN
INTRAVENOUS | Status: AC
  Administered 2017-02-15: 2 g via INTRAVENOUS
  Filled 2017-02-15: qty 100

## 2017-02-15 MED ORDER — SODIUM CHLORIDE 0.9 % IV SOLN
INTRAVENOUS | Status: DC
  Administered 2017-02-15: 12:00:00 via INTRAVENOUS

## 2017-02-15 MED ORDER — CEFAZOLIN SODIUM-DEXTROSE 2-4 GM/100ML-% IV SOLN
2.0000 g | INTRAVENOUS | Status: AC
  Administered 2017-02-15: 2 g via INTRAVENOUS

## 2017-02-15 NOTE — Sedation Documentation (Signed)
Patient is resting comfortably. 
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
## 2017-02-15 NOTE — Discharge Instructions (Addendum)
Moderate Conscious Sedation, Adult, Care After These instructions provide you with information about caring for yourself after your procedure. Your health care provider may also give you more specific instructions. Your treatment has been planned according to current medical practices, but problems sometimes occur. Call your health care provider if you have any problems or questions after your procedure. What can I expect after the procedure? After your procedure, it is common:  To feel sleepy for several hours.  To feel clumsy and have poor balance for several hours.  To have poor judgment for several hours.  To vomit if you eat too soon.  Follow these instructions at home: For at least 24 hours after the procedure:   Do not: ? Participate in activities where you could fall or become injured. ? Drive. ? Use heavy machinery. ? Drink alcohol. ? Take sleeping pills or medicines that cause drowsiness. ? Make important decisions or sign legal documents. ? Take care of children on your own.  Rest. Eating and drinking  Follow the diet recommended by your health care provider.  If you vomit: ? Drink water, juice, or soup when you can drink without vomiting. ? Make sure you have little or no nausea before eating solid foods. General instructions  Have a responsible adult stay with you until you are awake and alert.  Take over-the-counter and prescription medicines only as told by your health care provider.  If you smoke, do not smoke without supervision.  Keep all follow-up visits as told by your health care provider. This is important. Contact a health care provider if:  You keep feeling nauseous or you keep vomiting.  You feel light-headed.  You develop a rash.  You have a fever. Get help right away if:  You have trouble breathing. This information is not intended to replace advice given to you by your health care provider. Make sure you discuss any questions you have  with your health care provider. Document Released: 02/12/2013 Document Revised: 09/27/2015 Document Reviewed: 08/14/2015 Elsevier Interactive Patient Education  2018 [place] Insertion, Care After This sheet gives you information about how to care for yourself after your procedure. Your health care provider may also give you more specific instructions. If you have problems or questions, contact your health care provider. What can I expect after the procedure? After your procedure, it is common to have:  Discomfort at the port insertion site.  Bruising on the skin over the port. This should improve over 3-4 days.  Follow these instructions at home: [facility] care  After your port is placed, you will get a manufacturer's information card. The card has information about your port. Keep this card with you at all times.  Take care of the port as told by your health care provider. Ask your health care provider if you or a family member can get training for taking care of the port at home. A home health care nurse may also take care of the port.  Make sure to remember what type of port you have. Incision care  Follow instructions from your health care provider about how to take care of your port insertion site. Make sure you: ? Wash your hands with soap and water before you change your bandage (dressing). If soap and water are not available, use hand sanitizer. ? Change your dressing as told by your health care provider. ? Leave stitches (sutures), skin glue, or adhesive strips in place. These skin closures may need to  stay in place for 2 weeks or longer. If adhesive strip edges start to loosen and curl up, you may trim the loose edges. Do not remove adhesive strips completely unless your health care provider tells you to do that.  Check your port insertion site every day for signs of infection. Check for: ? More redness, swelling, or pain. ? More fluid or  blood. ? Warmth. ? Pus or a bad smell. General instructions  Do not take baths, swim, or use a hot tub until your health care provider approves.  Do not lift anything that is heavier than 10 lb (4.5 kg) for a week, or as told by your health care provider.  Ask your health care provider when it is okay to: ? Return to work or school. ? Resume usual physical activities or sports.  Do not drive for 24 hours if you were given a medicine to help you relax (sedative).  Take over-the-counter and prescription medicines only as told by your health care provider.  Wear a medical alert bracelet in case of an emergency. This will tell any health care providers that you have a port.  Keep all follow-up visits as told by your health care provider. This is important. Contact a health care provider if:  You cannot flush your port with saline as directed, or you cannot draw blood from the port.  You have a fever or chills.  You have more redness, swelling, or pain around your port insertion site.  You have more fluid or blood coming from your port insertion site.  Your port insertion site feels warm to the touch.  You have pus or a bad smell coming from the port insertion site. Get help right away if:  You have chest pain or shortness of breath.  You have bleeding from your port that you cannot control. Summary  Take care of the port as told by your health care provider.  Change your dressing as told by your health care provider.  Keep all follow-up visits as told by your health care provider. This information is not intended to replace advice given to you by your health care provider. Make sure you discuss any questions you have with your health care provider. Document Released: 02/12/2013 Document Revised: 03/15/2016 Document Reviewed: 03/15/2016 Elsevier Interactive Patient Education  2017 [employer].

## 2017-02-15 NOTE — Sedation Documentation (Signed)
Patient denies pain and is resting comfortably.  

## 2017-02-16 ENCOUNTER — Telehealth: Payer: Self-pay

## 2017-02-16 ENCOUNTER — Other Ambulatory Visit (HOSPITAL_BASED_OUTPATIENT_CLINIC_OR_DEPARTMENT_OTHER): Payer: Medicare HMO

## 2017-02-16 DIAGNOSIS — C3411 Malignant neoplasm of upper lobe, right bronchus or lung: Secondary | ICD-10-CM

## 2017-02-16 LAB — COMPREHENSIVE METABOLIC PANEL
ALT: 14 U/L (ref 0–55)
ANION GAP: 10 meq/L (ref 3–11)
AST: 24 U/L (ref 5–34)
Albumin: 2.7 g/dL — ABNORMAL LOW (ref 3.5–5.0)
Alkaline Phosphatase: 72 U/L (ref 40–150)
BILIRUBIN TOTAL: 0.23 mg/dL (ref 0.20–1.20)
BUN: 19 mg/dL (ref 7.0–26.0)
CHLORIDE: 104 meq/L (ref 98–109)
CO2: 31 mEq/L — ABNORMAL HIGH (ref 22–29)
CREATININE: 1.3 mg/dL (ref 0.7–1.3)
Calcium: 9.3 mg/dL (ref 8.4–10.4)
EGFR: 58 mL/min/{1.73_m2} — ABNORMAL LOW (ref >60)
Glucose: 122 mg/dl (ref 70–140)
Potassium: 4.1 mEq/L (ref 3.5–5.1)
Sodium: 145 mEq/L (ref 136–145)
Total Protein: 7 g/dL (ref 6.4–8.3)

## 2017-02-16 LAB — CBC WITH DIFFERENTIAL/PLATELET
BASO%: 0.7 % (ref 0.0–2.0)
Basophils Absolute: 0 10*3/uL (ref 0.0–0.1)
EOS ABS: 0.1 10*3/uL (ref 0.0–0.5)
EOS%: 1.7 % (ref 0.0–7.0)
HEMATOCRIT: 29.3 % — AB (ref 38.4–49.9)
HGB: 9.3 g/dL — ABNORMAL LOW (ref 13.0–17.1)
LYMPH%: 19.9 % (ref 14.0–49.0)
MCH: 27.6 pg (ref 27.2–33.4)
MCHC: 31.8 g/dL — AB (ref 32.0–36.0)
MCV: 86.9 fL (ref 79.3–98.0)
MONO#: 0.8 10*3/uL (ref 0.1–0.9)
MONO%: 13.3 % (ref 0.0–14.0)
NEUT#: 3.7 10*3/uL (ref 1.5–6.5)
NEUT%: 64.4 % (ref 39.0–75.0)
PLATELETS: 275 10*3/uL (ref 140–400)
RBC: 3.37 10*6/uL — ABNORMAL LOW (ref 4.20–5.82)
RDW: 23.1 % — ABNORMAL HIGH (ref 11.0–14.6)
WBC: 5.8 10*3/uL (ref 4.0–10.3)
lymph#: 1.2 10*3/uL (ref 0.9–3.3)

## 2017-02-16 NOTE — Telephone Encounter (Signed)
Printed avs and calender of scheduled appointments.10/12

## 2017-02-21 ENCOUNTER — Encounter: Payer: Self-pay | Admitting: Internal Medicine

## 2017-02-21 ENCOUNTER — Ambulatory Visit (INDEPENDENT_AMBULATORY_CARE_PROVIDER_SITE_OTHER): Payer: Medicare HMO | Admitting: Internal Medicine

## 2017-02-21 VITALS — BP 167/84 | HR 69 | Temp 98.2°F | Wt 249.4 lb

## 2017-02-21 DIAGNOSIS — N492 Inflammatory disorders of scrotum: Secondary | ICD-10-CM | POA: Diagnosis not present

## 2017-02-21 DIAGNOSIS — N5089 Other specified disorders of the male genital organs: Secondary | ICD-10-CM

## 2017-02-21 DIAGNOSIS — M5136 Other intervertebral disc degeneration, lumbar region: Secondary | ICD-10-CM

## 2017-02-21 DIAGNOSIS — M47816 Spondylosis without myelopathy or radiculopathy, lumbar region: Secondary | ICD-10-CM | POA: Diagnosis not present

## 2017-02-21 DIAGNOSIS — M159 Polyosteoarthritis, unspecified: Secondary | ICD-10-CM

## 2017-02-21 DIAGNOSIS — L03114 Cellulitis of left upper limb: Secondary | ICD-10-CM | POA: Diagnosis not present

## 2017-02-21 DIAGNOSIS — Z79891 Long term (current) use of opiate analgesic: Secondary | ICD-10-CM | POA: Diagnosis not present

## 2017-02-21 DIAGNOSIS — M150 Primary generalized (osteo)arthritis: Secondary | ICD-10-CM
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
## 2017-02-21 NOTE — Patient Instructions (Addendum)
It was a pleasure to meet you today [name].   -Please continue to use all your medication as recommended -Follow up with Dr. Charlynn Grimes  Thank you,  Nicholas Mage, MD Internal Medicine PGY1

## 2017-02-22 ENCOUNTER — Other Ambulatory Visit (HOSPITAL_BASED_OUTPATIENT_CLINIC_OR_DEPARTMENT_OTHER): Payer: Medicare HMO

## 2017-02-22 ENCOUNTER — Ambulatory Visit (HOSPITAL_BASED_OUTPATIENT_CLINIC_OR_DEPARTMENT_OTHER): Payer: Medicare HMO

## 2017-02-22 ENCOUNTER — Telehealth: Payer: Self-pay | Admitting: Internal Medicine

## 2017-02-22 ENCOUNTER — Encounter: Payer: Self-pay | Admitting: Internal Medicine

## 2017-02-22 ENCOUNTER — Ambulatory Visit (HOSPITAL_BASED_OUTPATIENT_CLINIC_OR_DEPARTMENT_OTHER): Payer: Medicare HMO | Admitting: Internal Medicine

## 2017-02-22 VITALS — BP 201/102 | HR 69 | Temp 98.0°F | Resp 18 | Ht 71.0 in | Wt 249.5 lb

## 2017-02-22 VITALS — BP 164/83 | HR 68

## 2017-02-22 DIAGNOSIS — Z5111 Encounter for antineoplastic chemotherapy: Secondary | ICD-10-CM

## 2017-02-22 DIAGNOSIS — C3411 Malignant neoplasm of upper lobe, right bronchus or lung: Secondary | ICD-10-CM | POA: Diagnosis not present

## 2017-02-22 DIAGNOSIS — N182 Chronic kidney disease, stage 2 (mild): Secondary | ICD-10-CM

## 2017-02-22 DIAGNOSIS — L03114 Cellulitis of left upper limb: Secondary | ICD-10-CM | POA: Insufficient documentation

## 2017-02-22 DIAGNOSIS — I10 Essential (primary) hypertension: Secondary | ICD-10-CM

## 2017-02-22 LAB — CBC WITH DIFFERENTIAL/PLATELET
BASO%: 0 % (ref 0.0–2.0)
Basophils Absolute: 0 10*3/uL (ref 0.0–0.1)
EOS%: 0 % (ref 0.0–7.0)
Eosinophils Absolute: 0 10*3/uL (ref 0.0–0.5)
HEMATOCRIT: 30 % — AB (ref 38.4–49.9)
HGB: 9.2 g/dL — ABNORMAL LOW (ref 13.0–17.1)
LYMPH%: 13.2 % — AB (ref 14.0–49.0)
MCH: 27.5 pg (ref 27.2–33.4)
MCHC: 30.7 g/dL — AB (ref 32.0–36.0)
MCV: 89.8 fL (ref 79.3–98.0)
MONO#: 0.4 10*3/uL (ref 0.1–0.9)
MONO%: 7.4 % (ref 0.0–14.0)
NEUT#: 4.2 10*3/uL (ref 1.5–6.5)
NEUT%: 79.4 % — AB (ref 39.0–75.0)
PLATELETS: 209 10*3/uL (ref 140–400)
RBC: 3.34 10*6/uL — AB (ref 4.20–5.82)
RDW: 21.4 % — ABNORMAL HIGH (ref 11.0–14.6)
WBC: 5.2 10*3/uL (ref 4.0–10.3)
lymph#: 0.7 10*3/uL — ABNORMAL LOW (ref 0.9–3.3)

## 2017-02-22 LAB — COMPREHENSIVE METABOLIC PANEL
ALT: 13 U/L (ref 0–55)
ANION GAP: 10 meq/L (ref 3–11)
AST: 23 U/L (ref 5–34)
Albumin: 2.7 g/dL — ABNORMAL LOW (ref 3.5–5.0)
Alkaline Phosphatase: 59 U/L (ref 40–150)
BILIRUBIN TOTAL: 0.39 mg/dL (ref 0.20–1.20)
BUN: 16.4 mg/dL (ref 7.0–26.0)
CALCIUM: 8.6 mg/dL (ref 8.4–10.4)
CHLORIDE: 102 meq/L (ref 98–109)
CO2: 30 meq/L — AB (ref 22–29)
Creatinine: 1 mg/dL (ref 0.7–1.3)
Glucose: 143 mg/dl — ABNORMAL HIGH (ref 70–140)
Potassium: 3.4 mEq/L — ABNORMAL LOW (ref 3.5–5.1)
Sodium: 142 mEq/L (ref 136–145)
TOTAL PROTEIN: 6.4 g/dL (ref 6.4–8.3)

## 2017-02-22 MED ORDER — CLONIDINE HCL 0.1 MG PO TABS
ORAL_TABLET | ORAL | Status: AC
  Filled 2017-02-22: qty 3

## 2017-02-22 MED ORDER — SODIUM CHLORIDE 0.9 % IV SOLN
Freq: Once | INTRAVENOUS | Status: AC
  Administered 2017-02-22: 11:00:00 via INTRAVENOUS

## 2017-02-22 MED ORDER — PALONOSETRON HCL INJECTION 0.25 MG/5ML
0.2500 mg | Freq: Once | INTRAVENOUS | Status: AC
  Administered 2017-02-22: 0.25 mg via INTRAVENOUS

## 2017-02-22 MED ORDER — PALONOSETRON HCL INJECTION 0.25 MG/5ML
INTRAVENOUS | Status: AC
Start: 2017-02-22 — End: ?
  Filled 2017-02-22: qty 5

## 2017-02-22 MED ORDER — SODIUM CHLORIDE 0.9% FLUSH
10.0000 mL | INTRAVENOUS | Status: DC | PRN
  Administered 2017-02-22: 10 mL
  Filled 2017-02-22: qty 10

## 2017-02-22 MED ORDER — HEPARIN SOD (PORK) LOCK FLUSH 100 UNIT/ML IV SOLN
500.0000 [IU] | Freq: Once | INTRAVENOUS | Status: AC | PRN
  Administered 2017-02-22: 500 [IU]
  Filled 2017-02-22: qty 5

## 2017-02-22 MED ORDER — SODIUM CHLORIDE 0.9 % IV SOLN
480.0000 mg/m2 | Freq: Once | INTRAVENOUS | Status: AC
  Administered 2017-02-22: 1100 mg via INTRAVENOUS
  Filled 2017-02-22: qty 40

## 2017-02-22 MED ORDER — SODIUM CHLORIDE 0.9 % IV SOLN
452.0000 mg | Freq: Once | INTRAVENOUS | Status: AC
  Administered 2017-02-22: 450 mg via INTRAVENOUS
  Filled 2017-02-22: qty 45

## 2017-02-22 MED ORDER — CARBOPLATIN CHEMO INTRADERMAL TEST DOSE 100MCG/0.02ML
100.0000 ug | Freq: Once | INTRADERMAL | Status: AC
  Administered 2017-02-22: 100 ug via INTRADERMAL
  Filled 2017-02-22: qty 0.02

## 2017-02-22 MED ORDER — CLONIDINE HCL 0.1 MG PO TABS
0.3000 mg | ORAL_TABLET | Freq: Once | ORAL | Status: DC
  Administered 2017-02-22: 0.3 mg via ORAL

## 2017-02-22 MED ORDER — SODIUM CHLORIDE 0.9 % IV SOLN
Freq: Once | INTRAVENOUS | Status: AC
  Administered 2017-02-22: 14:00:00 via INTRAVENOUS
  Filled 2017-02-22: qty 5

## 2017-02-22 NOTE — Patient Instructions (Signed)
[facility] Discharge Instructions for Patients Receiving Chemotherapy  Today you received the following chemotherapy agents: Alimta and Carboplatin   To help prevent nausea and vomiting after your treatment, we encourage you to take your nausea medication as directed.    If you develop nausea and vomiting that is not controlled by your nausea medication, call the clinic.   BELOW ARE SYMPTOMS THAT SHOULD BE REPORTED IMMEDIATELY:  *FEVER GREATER THAN 100.5 F  *CHILLS WITH OR WITHOUT FEVER  NAUSEA AND VOMITING THAT IS NOT CONTROLLED WITH YOUR NAUSEA MEDICATION  *UNUSUAL SHORTNESS OF BREATH  *UNUSUAL BRUISING OR BLEEDING  TENDERNESS IN MOUTH AND THROAT WITH OR WITHOUT PRESENCE OF ULCERS  *URINARY PROBLEMS  *BOWEL PROBLEMS  UNUSUAL RASH Items with * indicate a potential emergency and should be followed up as soon as possible.  Feel free to call the clinic should you have any questions or concerns. The clinic phone number is [phone].  Please show the [place] at check-in to the Emergency Department and triage nurse.

## 2017-02-22 NOTE — Telephone Encounter (Signed)
Scheduled appt per 10/18 los - Gave patient AVS and calender per los.

## 2017-02-22 NOTE — Assessment & Plan Note (Signed)
The patient has been taking Doxycycline 100mg  qd from 02/04/17-02/15/17. The patient stated that he was compliant with the medication and did not miss any doses. The cellulitis on his scrotum looks to have resolved. He denies any discharge or blood in urine.   -Follow up if reappears
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
## 2017-02-22 NOTE — Assessment & Plan Note (Addendum)
The patient has a patch 4-5 cm in diameter that is mildy erythemaous and with granulation tissue over it. The area of previous cellulitis appears to be healing. No purulence noted.   -Follow up if worsening, noting any purulence or increasing erythema

## 2017-02-22 NOTE — Assessment & Plan Note (Signed)
The patient states that he has been having 8/10 lumbar back pain, intermittently present, and non radiating. He has not had any recent falls. The patient has a history of chronic back pain. Per lumbar spine x-ray from 02/2007 showing diffuse degenerative disc disease and spondylosis throughout lumbar spine, worst at L4-L5, L5-S1 (also seen by CT 06/2005).  The patient is currently taking hydrocodone-acetaminophen 10-325. He states that helps somewhat. He has used 2-3 times per week as needed never exceeding 4 doses per day  -Continue using Norco 10-325 prn

## 2017-02-23 DIAGNOSIS — J449 Chronic obstructive pulmonary disease, unspecified: Secondary | ICD-10-CM | POA: Diagnosis not present

## 2017-02-23 NOTE — Progress Notes (Signed)
Internal Medicine Clinic Attending  I saw and evaluated the patient.  I personally confirmed the key portions of the history and exam documented by Dr. Chundi and I reviewed pertinent patient test results.  The assessment, diagnosis, and plan were formulated together and I agree with the documentation in the resident's note. 

## 2017-03-01 ENCOUNTER — Other Ambulatory Visit: Payer: Self-pay

## 2017-03-01 ENCOUNTER — Other Ambulatory Visit (HOSPITAL_BASED_OUTPATIENT_CLINIC_OR_DEPARTMENT_OTHER): Payer: Medicare HMO

## 2017-03-01 DIAGNOSIS — C3411 Malignant neoplasm of upper lobe, right bronchus or lung: Secondary | ICD-10-CM | POA: Diagnosis not present

## 2017-03-01 LAB — CBC WITH DIFFERENTIAL/PLATELET
BASO%: 0.5 % (ref 0.0–2.0)
Basophils Absolute: 0 10*3/uL (ref 0.0–0.1)
EOS%: 0.3 % (ref 0.0–7.0)
Eosinophils Absolute: 0 10*3/uL (ref 0.0–0.5)
HEMATOCRIT: 29.5 % — AB (ref 38.4–49.9)
HGB: 9.5 g/dL — ABNORMAL LOW (ref 13.0–17.1)
LYMPH#: 0.6 10*3/uL — AB (ref 0.9–3.3)
LYMPH%: 13.3 % — AB (ref 14.0–49.0)
MCH: 28.4 pg (ref 27.2–33.4)
MCHC: 32.2 g/dL (ref 32.0–36.0)
MCV: 88.4 fL (ref 79.3–98.0)
MONO#: 0.1 10*3/uL (ref 0.1–0.9)
MONO%: 1.3 % (ref 0.0–14.0)
NEUT#: 3.6 10*3/uL (ref 1.5–6.5)
NEUT%: 84.6 % — AB (ref 39.0–75.0)
PLATELETS: 149 10*3/uL (ref 140–400)
RBC: 3.34 10*6/uL — AB (ref 4.20–5.82)
RDW: 21.1 % — ABNORMAL HIGH (ref 11.0–14.6)
WBC: 4.3 10*3/uL (ref 4.0–10.3)

## 2017-03-01 LAB — COMPREHENSIVE METABOLIC PANEL
ALT: 17 U/L (ref 0–55)
ANION GAP: 11 meq/L (ref 3–11)
AST: 23 U/L (ref 5–34)
Albumin: 2.9 g/dL — ABNORMAL LOW (ref 3.5–5.0)
Alkaline Phosphatase: 49 U/L (ref 40–150)
BUN: 20 mg/dL (ref 7.0–26.0)
CALCIUM: 8.6 mg/dL (ref 8.4–10.4)
CHLORIDE: 100 meq/L (ref 98–109)
CO2: 30 meq/L — AB (ref 22–29)
CREATININE: 1 mg/dL (ref 0.7–1.3)
Glucose: 129 mg/dl (ref 70–140)
Potassium: 3.3 mEq/L — ABNORMAL LOW (ref 3.5–5.1)
Sodium: 141 mEq/L (ref 136–145)
Total Bilirubin: 1.17 mg/dL (ref 0.20–1.20)
Total Protein: 6.4 g/dL (ref 6.4–8.3)

## 2017-03-01 LAB — UA PROTEIN, DIPSTICK - CHCC: PROTEIN: 30 mg/dL

## 2017-03-01 NOTE — Telephone Encounter (Signed)
HYDROcodone-acetaminophen (NORCO) 10-325 MG tablet, Refill request.

## 2017-03-02 ENCOUNTER — Ambulatory Visit (INDEPENDENT_AMBULATORY_CARE_PROVIDER_SITE_OTHER): Payer: Medicare HMO | Admitting: Internal Medicine

## 2017-03-02 VITALS — BP 163/65 | HR 84 | Temp 98.5°F | Wt 218.0 lb

## 2017-03-02 DIAGNOSIS — Z993 Dependence on wheelchair: Secondary | ICD-10-CM | POA: Diagnosis not present

## 2017-03-02 DIAGNOSIS — N492 Inflammatory disorders of scrotum: Secondary | ICD-10-CM

## 2017-03-02 DIAGNOSIS — H547 Unspecified visual loss: Secondary | ICD-10-CM | POA: Diagnosis not present

## 2017-03-02 NOTE — Patient Instructions (Addendum)
Nicholas [name],  It was a pleasure to see you today. Your infection is healing very nicely. I have given you some antibiotic ointment and gauze to apply to the area to keep if from sticking to your under garments. Please follow up with your primary care doctor in 3 months. If you have any questions or concerns, call our clinic at [phone] or after hours call [phone] and ask for the internal medicine resident on call. Thank you!  - Dr. Philipp Ovens

## 2017-03-02 NOTE — Assessment & Plan Note (Signed)
Appears to be healing nicely on exam with pink granulation tissue. No signs of infection today. He denies fevers. He denies drainage. He is complaining that his undergarments often stick to his scrotum and is painful when he goes to remove them. He is also complaining of burning to the area after urination. Explained that urine likely irritating to the superficial scrotal wounds and advised him to dry the area well after using the bathroom. Provided him with samples of bacitracin & gauze. Advised him to apply the ointment as needed to the area and cover with gauze to prevent his clothes from sticking and urine from irritating the open wounds. Informed patient that the area should continue to heal without further antibiotic treatment.  -- Topical bacitracin ointment or neosporin to the area PRN with gauze for protection from clothing & urine  -- Follow up as needed
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
## 2017-03-03 ENCOUNTER — Other Ambulatory Visit: Payer: Self-pay | Admitting: Internal Medicine

## 2017-03-05 ENCOUNTER — Telehealth: Payer: Self-pay | Admitting: *Deleted

## 2017-03-05 ENCOUNTER — Ambulatory Visit: Payer: Medicare HMO

## 2017-03-05 NOTE — Telephone Encounter (Signed)
Pt's daughter calls and states pt needs his pain med today

## 2017-03-07 ENCOUNTER — Ambulatory Visit (INDEPENDENT_AMBULATORY_CARE_PROVIDER_SITE_OTHER): Payer: Medicare HMO | Admitting: Internal Medicine

## 2017-03-07 DIAGNOSIS — H547 Unspecified visual loss: Secondary | ICD-10-CM | POA: Diagnosis not present

## 2017-03-07 DIAGNOSIS — Z79899 Other long term (current) drug therapy: Secondary | ICD-10-CM | POA: Diagnosis not present

## 2017-03-07 DIAGNOSIS — Z79891 Long term (current) use of opiate analgesic: Secondary | ICD-10-CM

## 2017-03-07 DIAGNOSIS — N492 Inflammatory disorders of scrotum: Secondary | ICD-10-CM

## 2017-03-07 DIAGNOSIS — Z87891 Personal history of nicotine dependence: Secondary | ICD-10-CM

## 2017-03-07 DIAGNOSIS — I10 Essential (primary) hypertension: Secondary | ICD-10-CM | POA: Diagnosis not present

## 2017-03-07 DIAGNOSIS — M1990 Unspecified osteoarthritis, unspecified site: Secondary | ICD-10-CM

## 2017-03-07 DIAGNOSIS — M159 Polyosteoarthritis, unspecified: Secondary | ICD-10-CM

## 2017-03-07 DIAGNOSIS — M150 Primary generalized (osteo)arthritis: Principal | ICD-10-CM

## 2017-03-07 MED ORDER — BACITRACIN ZINC 500 UNIT/GM EX OINT: TOPICAL_OINTMENT | CUTANEOUS | 0 refills | Status: AC

## 2017-03-07 MED ORDER — SPIRONOLACTONE 25 MG PO TABS: 25.0000 mg | ORAL_TABLET | Freq: Every day | ORAL | 2 refills | Status: AC

## 2017-03-07 MED ORDER — HYDROCODONE-ACETAMINOPHEN 10-325 MG PO TABS: 1.0000 | ORAL_TABLET | Freq: Four times a day (QID) | ORAL | 0 refills | Status: DC | PRN

## 2017-03-07 MED ORDER — HYDROCODONE-ACETAMINOPHEN 10-325 MG PO TABS: 1.0000 | ORAL_TABLET | Freq: Four times a day (QID) | ORAL | 0 refills | Status: AC | PRN

## 2017-03-07 NOTE — Assessment & Plan Note (Signed)
Pain is better controlled now that he is back on hydrocodone 10-325 mg. Reports taking 3-4 pills a day with improvement in his pain. Requesting refill on his medications today as he was only given a 1 month supply at the beginning of the month when he was switched back to norco from percocet.  Reviewed database with appropriate refills.  A/P: Continue Hydrocodone 10-325 #120/month (2 refills given today). Follow up in 6 weeks.
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
## 2017-03-07 NOTE — Assessment & Plan Note (Signed)
BP Readings from Last 3 Encounters:  03/07/17 (!) 163/76  03/02/17 (!) 163/65  02/22/17 (!) 164/83    Lab Results  Component Value Date   NA 141 03/01/2017   K 3.3 (L) 03/01/2017   CREATININE 1.0 03/01/2017   BP remains elevated. Reports compliance with his medications. Has been very poorly controlled at chemotherapy visits and has required pre-treatment to get his blood pressures controlled enough to give chemotherapy. He is asymptomatic. Currently prescribed carvedilol 25 mg bid, lasix 20 mg daily and enalapril 40 mg daily. Has had issues with non-compliance in the past.   A/P: Will add spironolactone 25 mg daily. Given history of noncompliance would be hesitant to add hydral or clonidine. Has had issues with LE edema in the past making amlodipine problematic. Follow up in 1 month.

## 2017-03-07 NOTE — Patient Instructions (Signed)
Mr. Smiles,  I am starting you on a new medication for you blood pressure called Spironolactone. Please follow up with me in 1 month for re-check.

## 2017-03-07 NOTE — Assessment & Plan Note (Signed)
Appears to be healing well. No evidence of infection on exam today. Still complaining of irritation and requesting cream for the area. No systemic signs of infection.  A/P Continue bacitracin ointment or neosporin prn

## 2017-03-08 ENCOUNTER — Other Ambulatory Visit (HOSPITAL_BASED_OUTPATIENT_CLINIC_OR_DEPARTMENT_OTHER): Payer: Medicare HMO

## 2017-03-08 DIAGNOSIS — C3411 Malignant neoplasm of upper lobe, right bronchus or lung: Secondary | ICD-10-CM

## 2017-03-08 LAB — COMPREHENSIVE METABOLIC PANEL
ALBUMIN: 3 g/dL — AB (ref 3.5–5.0)
ALK PHOS: 59 U/L (ref 40–150)
ALT: 32 U/L (ref 0–55)
ANION GAP: 9 meq/L (ref 3–11)
AST: 29 U/L (ref 5–34)
BILIRUBIN TOTAL: 0.32 mg/dL (ref 0.20–1.20)
BUN: 19 mg/dL (ref 7.0–26.0)
CALCIUM: 8.9 mg/dL (ref 8.4–10.4)
CO2: 27 mEq/L (ref 22–29)
Chloride: 107 mEq/L (ref 98–109)
Creatinine: 1.1 mg/dL (ref 0.7–1.3)
EGFR: >60 mL/min/{1.73_m2} (ref >60)
Glucose: 132 mg/dl (ref 70–140)
Potassium: 4.1 mEq/L (ref 3.5–5.1)
Sodium: 143 mEq/L (ref 136–145)
Total Protein: 6.6 g/dL (ref 6.4–8.3)

## 2017-03-08 LAB — CBC WITH DIFFERENTIAL/PLATELET
BASO%: 0.3 % (ref 0.0–2.0)
Basophils Absolute: 0 10*3/uL (ref 0.0–0.1)
EOS ABS: 0 10*3/uL (ref 0.0–0.5)
EOS%: 0.9 % (ref 0.0–7.0)
HEMATOCRIT: 27.7 % — AB (ref 38.4–49.9)
HEMOGLOBIN: 8.8 g/dL — AB (ref 13.0–17.1)
LYMPH%: 16 % (ref 14.0–49.0)
MCH: 29 pg (ref 27.2–33.4)
MCHC: 31.8 g/dL — ABNORMAL LOW (ref 32.0–36.0)
MCV: 91 fL (ref 79.3–98.0)
MONO#: 0.4 10*3/uL (ref 0.1–0.9)
MONO%: 10.4 % (ref 0.0–14.0)
NEUT%: 72.4 % (ref 39.0–75.0)
NEUTROS ABS: 2.7 10*3/uL (ref 1.5–6.5)
PLATELETS: 73 10*3/uL — AB (ref 140–400)
RBC: 3.05 10*6/uL — ABNORMAL LOW (ref 4.20–5.82)
RDW: 19.3 % — ABNORMAL HIGH (ref 11.0–14.6)
WBC: 3.8 10*3/uL — AB (ref 4.0–10.3)
lymph#: 0.6 10*3/uL — ABNORMAL LOW (ref 0.9–3.3)
nRBC: 0 % (ref 0–0)

## 2017-03-09 NOTE — Progress Notes (Signed)
Internal Medicine Clinic Attending  Case discussed with Dr. Charlynn Grimes  at the time of the visit.  We reviewed the resident's history and exam and pertinent patient test results.  I agree with the assessment, diagnosis, and plan of care documented in the resident's note.  Oda Kilts, MD

## 2017-03-11 NOTE — Progress Notes (Signed)
Internal Medicine Clinic Attending  Case discussed with Dr. Guilloud at the time of the visit.  We reviewed the resident's history and exam and pertinent patient test results.  I agree with the assessment, diagnosis, and plan of care documented in the resident's note.  

## 2017-03-15 ENCOUNTER — Ambulatory Visit (HOSPITAL_BASED_OUTPATIENT_CLINIC_OR_DEPARTMENT_OTHER): Payer: Medicare HMO | Admitting: Oncology

## 2017-03-15 ENCOUNTER — Other Ambulatory Visit (HOSPITAL_BASED_OUTPATIENT_CLINIC_OR_DEPARTMENT_OTHER): Payer: Medicare HMO

## 2017-03-15 ENCOUNTER — Ambulatory Visit (HOSPITAL_BASED_OUTPATIENT_CLINIC_OR_DEPARTMENT_OTHER): Payer: Medicare HMO

## 2017-03-15 VITALS — BP 164/84 | HR 84 | Temp 98.1°F | Resp 18 | Ht 71.0 in | Wt 225.1 lb

## 2017-03-15 VITALS — BP 161/81 | HR 72

## 2017-03-15 DIAGNOSIS — Z5112 Encounter for antineoplastic immunotherapy: Secondary | ICD-10-CM | POA: Diagnosis not present

## 2017-03-15 DIAGNOSIS — C3411 Malignant neoplasm of upper lobe, right bronchus or lung: Secondary | ICD-10-CM

## 2017-03-15 DIAGNOSIS — Z5111 Encounter for antineoplastic chemotherapy: Secondary | ICD-10-CM

## 2017-03-15 LAB — CBC WITH DIFFERENTIAL/PLATELET
BASO%: 0.1 % (ref 0.0–2.0)
Basophils Absolute: 0 10*3/uL (ref 0.0–0.1)
EOS ABS: 0 10*3/uL (ref 0.0–0.5)
EOS%: 0 % (ref 0.0–7.0)
HCT: 27.8 % — ABNORMAL LOW (ref 38.4–49.9)
HEMOGLOBIN: 9 g/dL — AB (ref 13.0–17.1)
LYMPH%: 10.8 % — AB (ref 14.0–49.0)
MCH: 29.6 pg (ref 27.2–33.4)
MCHC: 32.2 g/dL (ref 32.0–36.0)
MCV: 92 fL (ref 79.3–98.0)
MONO#: 0.3 10*3/uL (ref 0.1–0.9)
MONO%: 5.5 % (ref 0.0–14.0)
NEUT%: 83.6 % — ABNORMAL HIGH (ref 39.0–75.0)
NEUTROS ABS: 4.2 10*3/uL (ref 1.5–6.5)
Platelets: 249 10*3/uL (ref 140–400)
RBC: 3.02 10*6/uL — AB (ref 4.20–5.82)
RDW: 19.8 % — AB (ref 11.0–14.6)
WBC: 5.1 10*3/uL (ref 4.0–10.3)
lymph#: 0.5 10*3/uL — ABNORMAL LOW (ref 0.9–3.3)

## 2017-03-15 LAB — COMPREHENSIVE METABOLIC PANEL
ALBUMIN: 3 g/dL — AB (ref 3.5–5.0)
ALK PHOS: 68 U/L (ref 40–150)
ALT: 26 U/L (ref 0–55)
ANION GAP: 12 meq/L — AB (ref 3–11)
AST: 20 U/L (ref 5–34)
BUN: 18.2 mg/dL (ref 7.0–26.0)
CALCIUM: 8.5 mg/dL (ref 8.4–10.4)
CHLORIDE: 103 meq/L (ref 98–109)
CO2: 27 mEq/L (ref 22–29)
Creatinine: 1.1 mg/dL (ref 0.7–1.3)
EGFR: >60 mL/min/{1.73_m2} (ref >60)
Glucose: 189 mg/dl — ABNORMAL HIGH (ref 70–140)
POTASSIUM: 3.2 meq/L — AB (ref 3.5–5.1)
Sodium: 141 mEq/L (ref 136–145)
Total Bilirubin: 0.34 mg/dL (ref 0.20–1.20)
Total Protein: 6.4 g/dL (ref 6.4–8.3)

## 2017-03-15 LAB — UA PROTEIN, DIPSTICK - CHCC: Protein, ur: 100 mg/dL

## 2017-03-15 MED ORDER — CYANOCOBALAMIN 1000 MCG/ML IJ SOLN
INTRAMUSCULAR | Status: AC
  Filled 2017-03-15: qty 1

## 2017-03-15 MED ORDER — SODIUM CHLORIDE 0.9 % IV SOLN
525.0000 mg | Freq: Once | INTRAVENOUS | Status: AC
  Administered 2017-03-15: 530 mg via INTRAVENOUS
  Filled 2017-03-15: qty 53

## 2017-03-15 MED ORDER — SODIUM CHLORIDE 0.9 % IV SOLN
Freq: Once | INTRAVENOUS | Status: AC
  Administered 2017-03-15: 16:00:00 via INTRAVENOUS
  Filled 2017-03-15: qty 5

## 2017-03-15 MED ORDER — CYANOCOBALAMIN 1000 MCG/ML IJ SOLN
1000.0000 ug | Freq: Once | INTRAMUSCULAR | Status: AC
  Administered 2017-03-15: 1000 ug via INTRAMUSCULAR

## 2017-03-15 MED ORDER — HEPARIN SOD (PORK) LOCK FLUSH 100 UNIT/ML IV SOLN
500.0000 [IU] | Freq: Once | INTRAVENOUS | Status: AC | PRN
  Administered 2017-03-15: 500 [IU]
  Filled 2017-03-15: qty 5

## 2017-03-15 MED ORDER — SODIUM CHLORIDE 0.9 % IV SOLN
1600.0000 mg | Freq: Once | INTRAVENOUS | Status: AC
  Administered 2017-03-15: 1600 mg via INTRAVENOUS
  Filled 2017-03-15: qty 64

## 2017-03-15 MED ORDER — SODIUM CHLORIDE 0.9 % IV SOLN
Freq: Once | INTRAVENOUS | Status: AC
  Administered 2017-03-15: 15:00:00 via INTRAVENOUS

## 2017-03-15 MED ORDER — PALONOSETRON HCL INJECTION 0.25 MG/5ML
0.2500 mg | Freq: Once | INTRAVENOUS | Status: AC
  Administered 2017-03-15: 0.25 mg via INTRAVENOUS

## 2017-03-15 MED ORDER — SODIUM CHLORIDE 0.9% FLUSH
10.0000 mL | INTRAVENOUS | Status: DC | PRN
  Administered 2017-03-15: 10 mL
  Filled 2017-03-15: qty 10

## 2017-03-15 MED ORDER — PALONOSETRON HCL INJECTION 0.25 MG/5ML
INTRAVENOUS | Status: AC
  Filled 2017-03-15: qty 5

## 2017-03-15 MED ORDER — CARBOPLATIN CHEMO INTRADERMAL TEST DOSE 100MCG/0.02ML
100.0000 ug | Freq: Once | INTRADERMAL | Status: AC
  Administered 2017-03-15: 100 ug via INTRADERMAL
  Filled 2017-03-15: qty 0.02

## 2017-03-15 MED ORDER — SODIUM CHLORIDE 0.9 % IV SOLN
1100.0000 mg | Freq: Once | INTRAVENOUS | Status: AC
  Administered 2017-03-15: 1100 mg via INTRAVENOUS
  Filled 2017-03-15: qty 40
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
## 2017-03-15 NOTE — Patient Instructions (Signed)
[facility] Discharge Instructions for Patients Receiving Chemotherapy  Today you received the following chemotherapy agents Carboplatin, Alimta and Avastin  To help prevent nausea and vomiting after your treatment, we encourage you to take your nausea medication as directed   If you develop nausea and vomiting that is not controlled by your nausea medication, call the clinic.   BELOW ARE SYMPTOMS THAT SHOULD BE REPORTED IMMEDIATELY:  *FEVER GREATER THAN 100.5 F  *CHILLS WITH OR WITHOUT FEVER  NAUSEA AND VOMITING THAT IS NOT CONTROLLED WITH YOUR NAUSEA MEDICATION  *UNUSUAL SHORTNESS OF BREATH  *UNUSUAL BRUISING OR BLEEDING  TENDERNESS IN MOUTH AND THROAT WITH OR WITHOUT PRESENCE OF ULCERS  *URINARY PROBLEMS  *BOWEL PROBLEMS  UNUSUAL RASH Items with * indicate a potential emergency and should be followed up as soon as possible.  Feel free to call the clinic should you have any questions or concerns. The clinic phone number is [phone].  Please show the [facility] at check-in to the Emergency Department and triage nurse.

## 2017-03-15 NOTE — Progress Notes (Signed)
VO given and read back.  OK to treat with urine protein of 100 - Dr. Jerilynn Mages Mohamed/ Royce Macadamia RN

## 2017-03-16 ENCOUNTER — Telehealth: Payer: Self-pay | Admitting: Medical Oncology

## 2017-03-16 ENCOUNTER — Encounter: Payer: Self-pay | Admitting: Oncology

## 2017-03-16 NOTE — Assessment & Plan Note (Signed)
This is a very pleasant 82 year old African-American male with metastatic non-small cell lung cancer initially diagnosed as a stage IIIa adenocarcinoma status post a course of concurrent chemoradiation followed by consolidation chemotherapy. PDL 1 expression was 20%.  [name] is currently undergoing systemic chemotherapy with carboplatin, Alimta and Avastin status post 3 cycles. He continues to tolerate his treatment fairly well with no significant adverse effects. Recommend that he proceed with cycle 4 of his treatment today as scheduled.  Urine protein is 100 milligrams per deciliter.  Lab reviewed with Dr. Earlie Server and okay to treat with Avastin.  Follow-up visit will be in 3 weeks for evaluation before the next cycle of his treatment.  He was advised to call immediately if he has any concerning symptoms in the interval. [name] voices understanding of current disease status and treatment options and is in agreement with the current care plan. All questions were answered. [name] knows to call the clinic with any problems, questions or concerns. We can certainly see [name] much sooner if necessary.

## 2017-03-16 NOTE — Telephone Encounter (Signed)
LEft MV  with patient and or family and  listed  K+ rich foods to increase in diet including: bananas,oranges, avocado, squash, raisins, potatoes, cabbage,cauliflower, tuna,honey,beef,chicken,sardines,milk,turkey.

## 2017-03-22 ENCOUNTER — Other Ambulatory Visit: Payer: Medicare HMO

## 2017-03-28 ENCOUNTER — Other Ambulatory Visit: Payer: Medicare HMO

## 2017-04-05 ENCOUNTER — Ambulatory Visit: Payer: Medicare HMO | Admitting: Internal Medicine

## 2017-04-05 ENCOUNTER — Other Ambulatory Visit: Payer: Medicare HMO

## 2017-04-05 ENCOUNTER — Ambulatory Visit: Payer: Medicare HMO

## 2017-04-07 DIAGNOSIS — 419620001 Death: Secondary | SNOMED CT | POA: Diagnosis not present

## 2017-04-07 DEATH — deceased

## 2017-04-13 ENCOUNTER — Other Ambulatory Visit: Payer: Self-pay | Admitting: Nurse Practitioner

## 2017-05-15 IMAGING — CT NM PET TUM IMG INITIAL (PI) SKULL BASE T - THIGH
1 of 8 series · 1 of 25 positions shown · non-contrast
Comparison: Chest CT of 08/25/2014. Abdominal pelvic CT of
03/09/2010

CLINICAL DATA: Initial treatment strategy for staging of right
upper lobe lung mass..

EXAM:
NUCLEAR MEDICINE PET SKULL BASE TO THIGH
TECHNIQUE: 12.4 MCi F-18 FDG was injected intravenously. Full-ring PET imaging
was performed from the skull base to thigh after the radiotracer. CT
data was obtained and used for attenuation correction and anatomic
localization.
FASTING BLOOD GLUCOSE:  Value: 105 Mg/dl

[Series 4: ct sk_thigh 5.0 hd_fov · axial · 5.0mm · 1.13mm/px · 1 of 242 slices shown]
[im 242/242  brain]
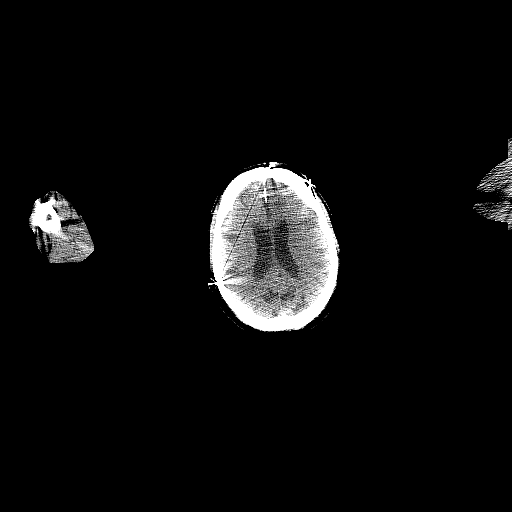

[1 of 25 positions shown; findings below may reference images not displayed]

FINDINGS: NECK

Right palatine tonsil hypermetabolism is without CT correlate. This
measures a S.U.V. max of 4.2, including on image 35.

No cervical nodal hypermetabolism.

CHEST

Spiculated right apical lung mass measures 3.9 x 2.8 cm and a S.U.V.
max of 10.0 on image 16.

Right paratracheal node measures 11 mm and demonstrates mild
hypermetabolism, measuring a S.U.V. max of 2.9. Is relatively
similar to those surrounding mediastinal pool.

Right paratracheal nodes which measure up to 1.3 cm and a S.U.V. max
of 2.3.

ABDOMEN/PELVIS

Left adrenal hypermetabolism and adreniform thickening. This
measures a S.U.V. max of 3.5 on image 117. No abnormal nodal
activity within the abdomen.

A right inguinal node measures 1.3 cm and a S.U.V. max of 2.6 on
image 202.

SKELETON

No abnormal marrow activity.

CT IMAGES PERFORMED FOR ATTENUATION CORRECTION

Radiopaque foreign objects about the scalp. Cerebral atrophy.
Extensive radiopaque foreign objects about the neck and chest,
causing mild beam hardening artifact.

Chest findings deferred to recent diagnostic CT. Multivessel
coronary artery atherosclerosis. Mild motion degradation throughout
the abdomen. Moderate prostatomegaly. Bilateral hydroceles.
IMPRESSION: 1. Right apical primary bronchogenic carcinoma.
2. Equivocal right mediastinal lymph node which is mildly enlarged
and demonstrates low-level hypermetabolism. Consider tissue
sampling.
3. Left adrenal hypermetabolism without well-defined adrenal nodule.
Favored to be physiologic.
4. Mild hypermetabolism within right inguinal and axillary nodes.
None in the typical drainage pattern for metastatic disease. Favored
to be physiologic.
5. Right palatine tonsil hypermetabolism is without CT correlate.
This could be re-evaluated at followup or correlated with direct
physical exam.
6. Incidental findings, including coronary artery disease and
prostatomegaly.

## 2017-05-23 IMAGING — CR DG CHEST 1V
1 series · 1 of 1 positions shown · non-contrast
Comparison: CT-guided biopsy 10/06/2014.  PET-CT 09/28/2014

CLINICAL DATA: 79-year-old male with a history of right upper lobe
nodule, status post biopsy.

EXAM:
CHEST  1 VIEW

[ap]
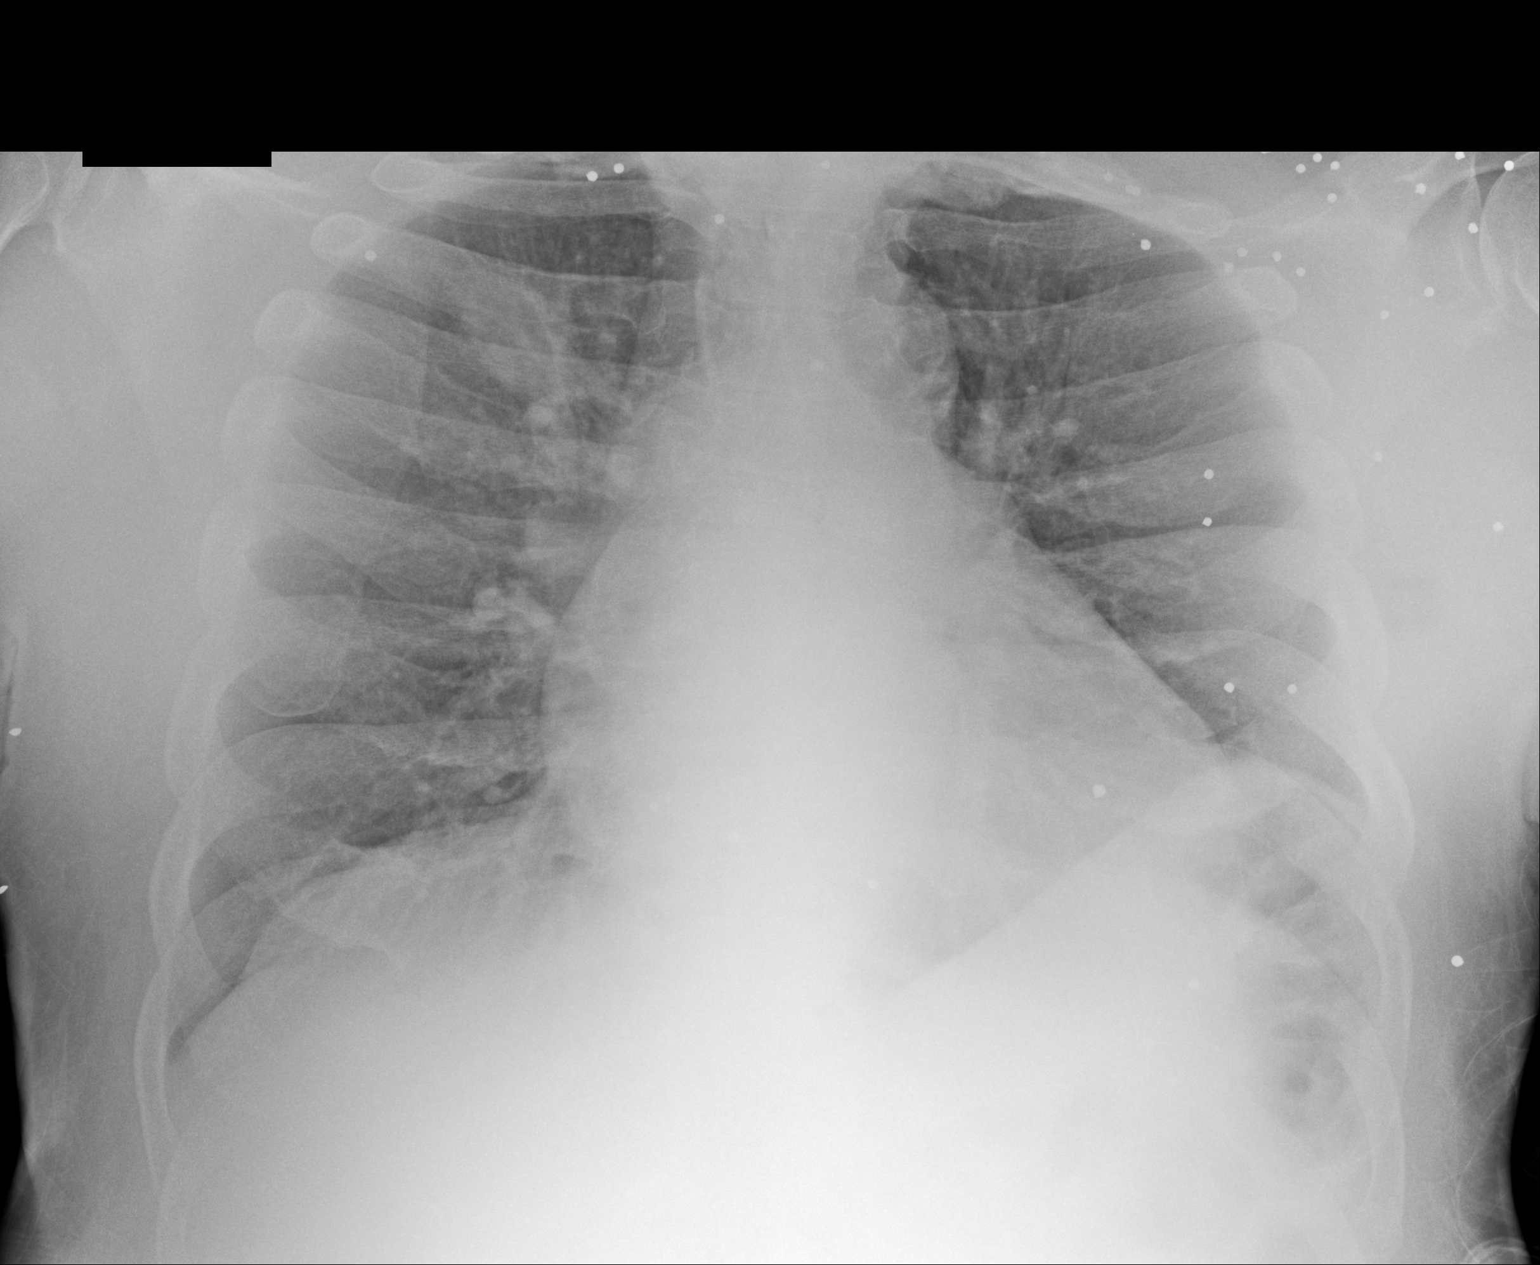

[1 of 1 positions shown; findings below may reference images not displayed]

FINDINGS: Cardiomediastinal silhouette within normal limits in size and
contour.

Right upper lobe nodule.

No evidence of pneumothorax or pleural effusion.

No confluent airspace disease.

Metallic shrapnel on the thorax again evident.
IMPRESSION: Status post right upper lobe nodule biopsy, with no pneumothorax
identified.

## 2017-05-23 IMAGING — CT CT BIOPSY
1 of 2 series · 9 of 32 positions shown, 15 images · non-contrast
Comparison: none

CLINICAL DATA: 79-year-old with a suspicious right upper lobe
lesion. The lesion is PET positive. Tissue diagnosis is needed.

[Series 2: i-spiral 5.0 b40f · axial · 0.92mm/px · z∈[+1475,+1559]mm · 9 of 31 slices shown, 15 images]
[im 4/31  soft-tissue]
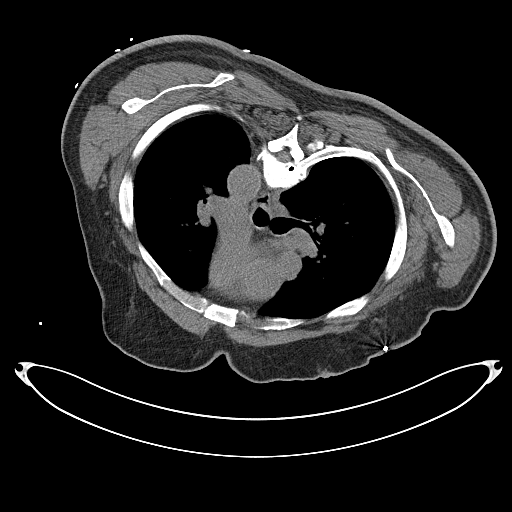
[im 4/31  bone]
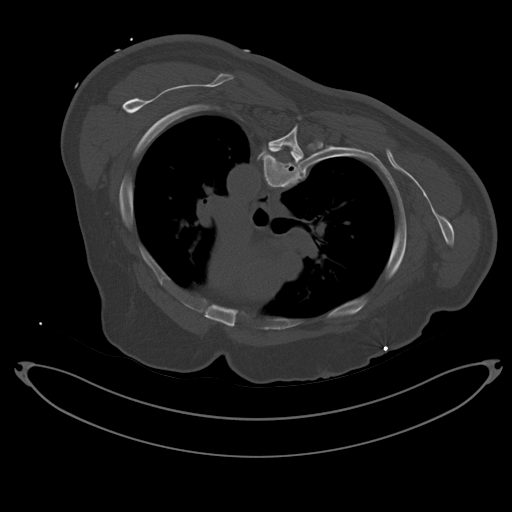
[im 7/31  soft-tissue]
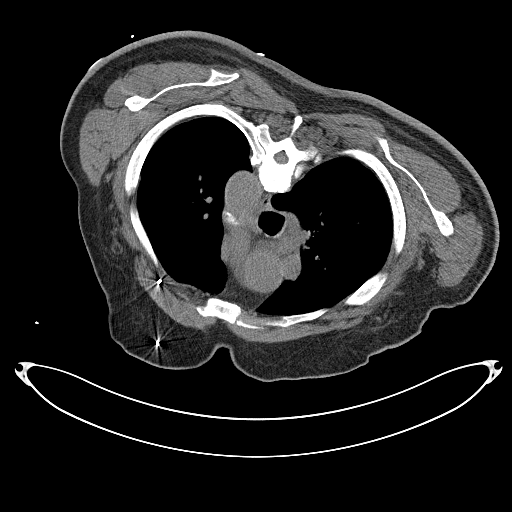
[im 10/31  soft-tissue]
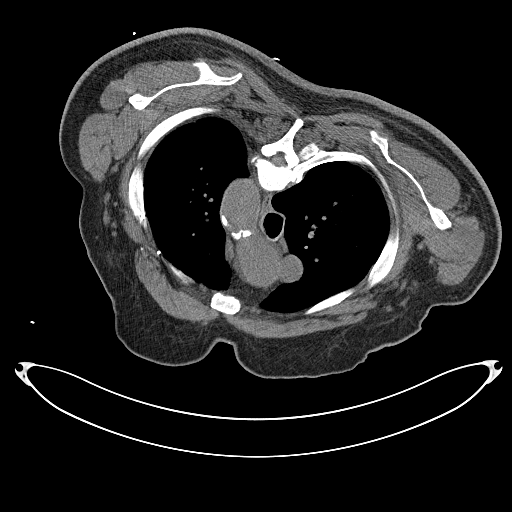
[im 13/31  soft-tissue]
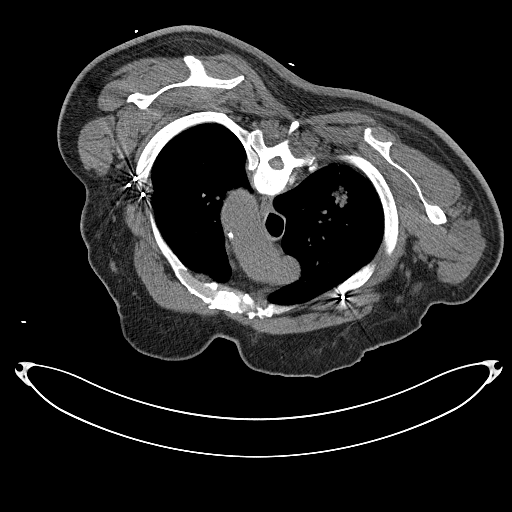
[im 16/31  soft-tissue]
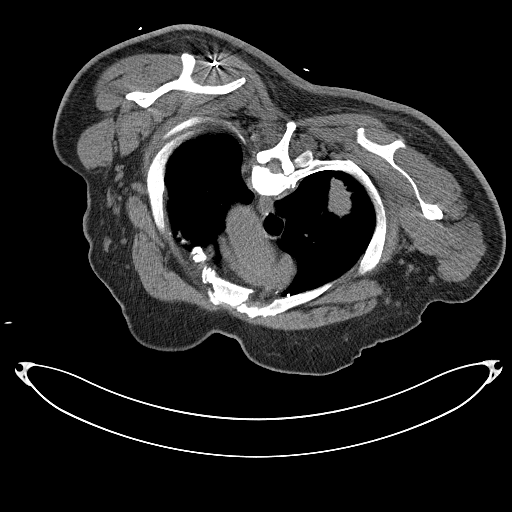
[im 19/31  soft-tissue]
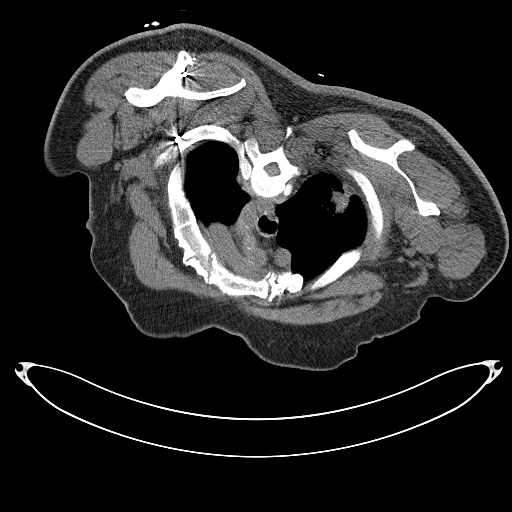
[im 19/31  lung]
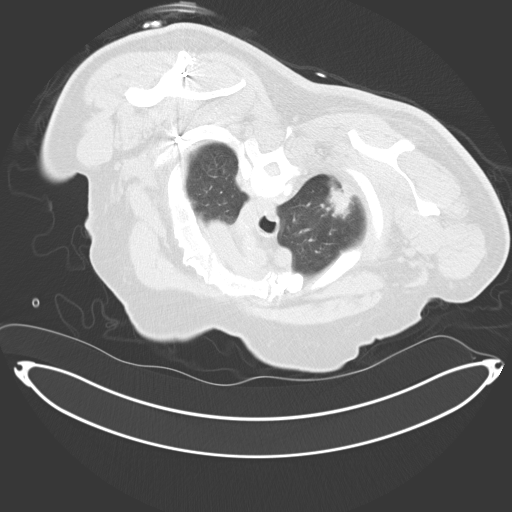
[im 22/31  soft-tissue]
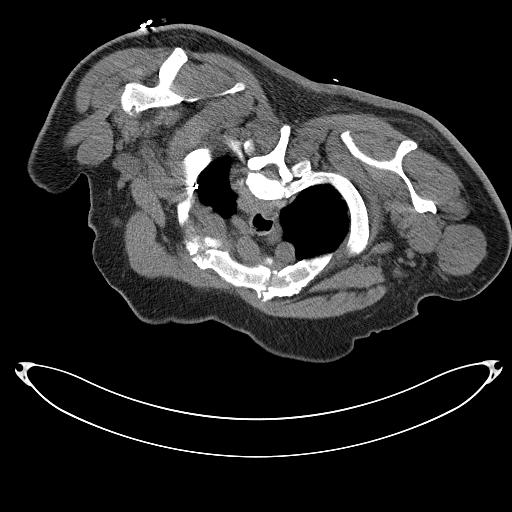
[im 22/31  lung]
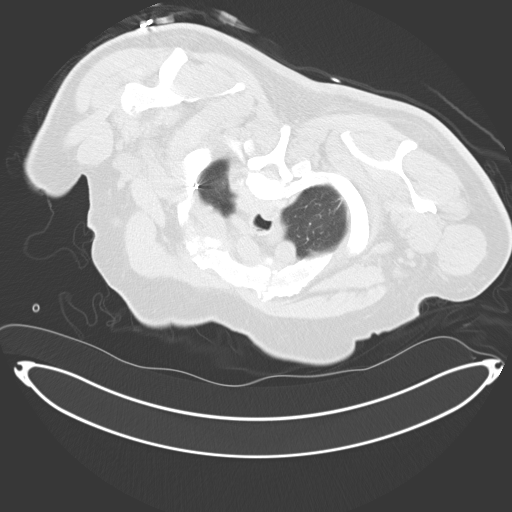
[im 25/31  soft-tissue]
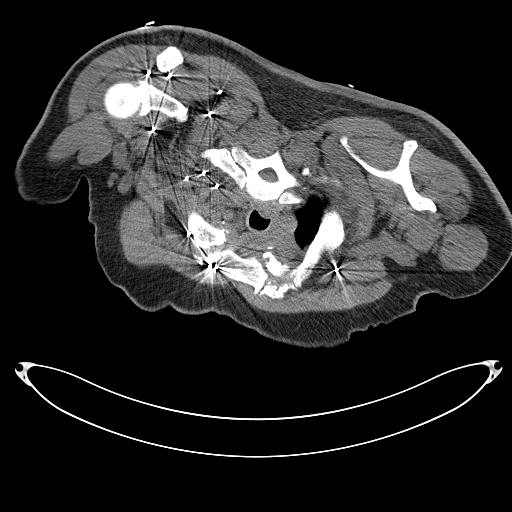
[im 25/31  lung]
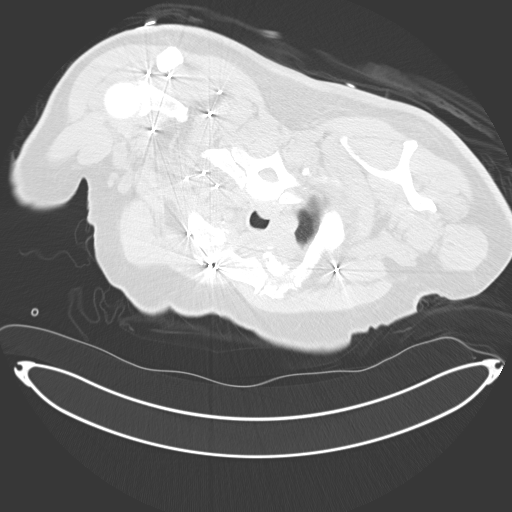
[im 28/31  soft-tissue]
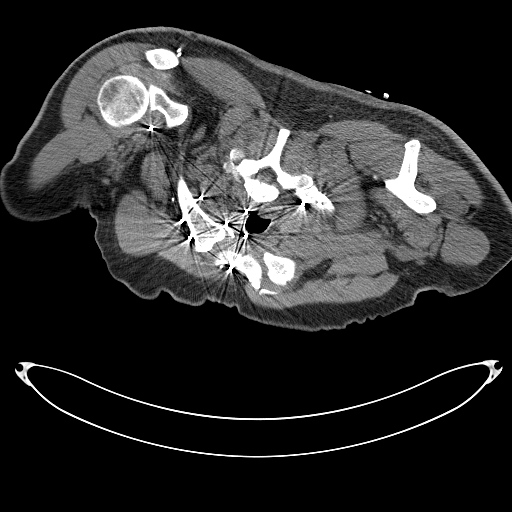
[im 28/31  lung]
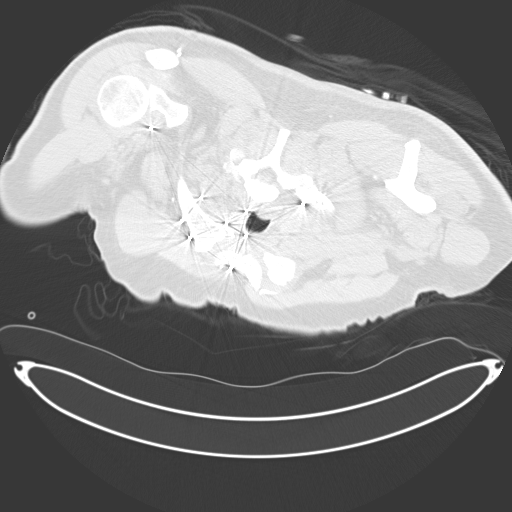
[im 28/31  bone]
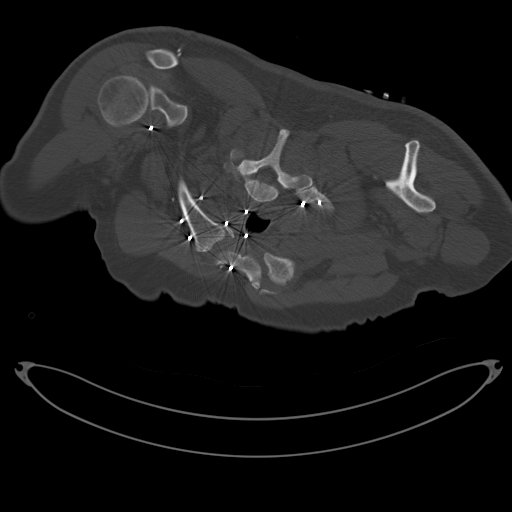

[9 of 32 positions shown; findings below may reference images not displayed]

EXAM:
CT-GUIDED BIOPSY OF RIGHT UPPER LOBE LUNG LESION

MEDICATIONS:
1 mg Versed, 50 mcg fentanyl. A radiology nurse monitored the
patient for moderate sedation.

ANESTHESIA/SEDATION:
Sedation time: 18 minutes

PROCEDURE:
The procedure was explained to the patient. The risks and benefits
of the procedure were discussed and the patient's questions were
addressed. Informed consent was obtained from the patient. Patient
was placed prone on the CT scanner. Images through the upper chest
were identified. The right side of the back was prepped and draped
in sterile fashion. Skin was anesthetized with 1% lidocaine. A 17
gauge needle was directed into the right upper lobe lesion with CT
guidance. Needle positioning was difficult due to the lesion
location posterior to a rib. Needle was eventually advanced into the
inferior aspect of the lesion. A total of 2 core biopsies were
obtained with an 18 gauge core device and specimens were placed in
formalin. The 17 gauge needle was removed without complication.
Bandage placed over the puncture site.
FINDINGS: There is an irregular shaped lesion in the right upper lobe
measuring roughly 3.4 cm. Needle was advanced along the inferior
aspect of the lesion. Small amount of parenchymal hemorrhage
following the core biopsies without a significant pneumothorax.

Estimated blood loss: Minimal

COMPLICATIONS:
None
IMPRESSION: CT-guided core biopsies of the right upper lobe lesion.
